# Patient Record
Sex: Male | Born: 1975 | Race: Black or African American | Hispanic: No | Marital: Single | Smoking: Former smoker
Health system: Southern US, Community
[De-identification: ages and names within clinical notes are randomized; demographics above are authoritative.]

## PROBLEM LIST (undated history)

## (undated) DIAGNOSIS — I10 Essential (primary) hypertension: Secondary | ICD-10-CM

## (undated) DIAGNOSIS — J45909 Unspecified asthma, uncomplicated: Secondary | ICD-10-CM

## (undated) HISTORY — PX: ANKLE FRACTURE SURGERY: SHX122

## (undated) HISTORY — PX: TONSILLECTOMY: SUR1361

## (undated) HISTORY — PX: ACHILLES TENDON SURGERY: SHX542

---

## 1999-10-21 ENCOUNTER — Encounter: Payer: Self-pay | Admitting: Internal Medicine

## 1999-10-21 ENCOUNTER — Emergency Department (HOSPITAL_COMMUNITY): Admission: EM | Admit: 1999-10-21 | Discharge: 1999-10-21 | Payer: Self-pay | Admitting: Internal Medicine

## 2003-05-06 ENCOUNTER — Emergency Department (HOSPITAL_COMMUNITY): Admission: EM | Admit: 2003-05-06 | Discharge: 2003-05-06 | Payer: Self-pay | Admitting: Emergency Medicine

## 2004-01-02 ENCOUNTER — Emergency Department (HOSPITAL_COMMUNITY): Admission: EM | Admit: 2004-01-02 | Discharge: 2004-01-03 | Payer: Self-pay | Admitting: Emergency Medicine

## 2005-07-05 ENCOUNTER — Emergency Department (HOSPITAL_COMMUNITY): Admission: EM | Admit: 2005-07-05 | Discharge: 2005-07-05 | Payer: Self-pay | Admitting: Emergency Medicine

## 2007-05-05 ENCOUNTER — Ambulatory Visit (HOSPITAL_BASED_OUTPATIENT_CLINIC_OR_DEPARTMENT_OTHER): Admission: RE | Admit: 2007-05-05 | Discharge: 2007-05-05 | Payer: Self-pay | Admitting: Otolaryngology

## 2007-05-07 ENCOUNTER — Ambulatory Visit: Payer: Self-pay | Admitting: Internal Medicine

## 2008-05-09 ENCOUNTER — Emergency Department (HOSPITAL_COMMUNITY): Admission: EM | Admit: 2008-05-09 | Discharge: 2008-05-10 | Payer: Self-pay | Admitting: Emergency Medicine

## 2010-07-02 ENCOUNTER — Ambulatory Visit (HOSPITAL_COMMUNITY): Admission: RE | Admit: 2010-07-02 | Discharge: 2010-07-03 | Payer: Self-pay | Admitting: Otolaryngology

## 2010-07-02 ENCOUNTER — Encounter (INDEPENDENT_AMBULATORY_CARE_PROVIDER_SITE_OTHER): Payer: Self-pay | Admitting: Otolaryngology

## 2011-03-07 LAB — URINALYSIS, ROUTINE W REFLEX MICROSCOPIC
Bilirubin Urine: NEGATIVE
Glucose, UA: NEGATIVE mg/dL
Hgb urine dipstick: NEGATIVE
Ketones, ur: NEGATIVE mg/dL
Nitrite: NEGATIVE
Protein, ur: NEGATIVE mg/dL
Specific Gravity, Urine: 1.018 (ref 1.005–1.030)
Urobilinogen, UA: 0.2 mg/dL (ref 0.0–1.0)
pH: 6 (ref 5.0–8.0)

## 2011-03-07 LAB — CBC
HCT: 43.5 % (ref 39.0–52.0)
Hemoglobin: 14.5 g/dL (ref 13.0–17.0)
MCH: 29 pg (ref 26.0–34.0)
MCHC: 33.3 g/dL (ref 30.0–36.0)
MCV: 86.9 fL (ref 78.0–100.0)
Platelets: 259 10*3/uL (ref 150–400)
RBC: 5.01 MIL/uL (ref 4.22–5.81)
RDW: 14.9 % (ref 11.5–15.5)
WBC: 7.2 10*3/uL (ref 4.0–10.5)

## 2011-03-07 LAB — SURGICAL PCR SCREEN
MRSA, PCR: NEGATIVE
Staphylococcus aureus: NEGATIVE

## 2011-05-04 NOTE — Procedures (Signed)
Gerald Palmer, Gerald Palmer              ACCOUNT NO.:  [id number]   MEDICAL RECORD NO.:  [id number]         PATIENT TYPE:  OUT   LOCATION:  SLEEP CENTER                 FACILITY:  [facility]   PHYSICIAN:  Clinton D. Maple Hudson, MD, FCCP, FACPDATE OF BIRTH:  09-12-1978   DATE OF STUDY:  05/05/2007                            NOCTURNAL POLYSOMNOGRAM   REFERRING PHYSICIAN:  Hermelinda Medicus, M.D.   INDICATION FOR STUDY:  Hypersomnia with sleep apnea.   EPWORTH SLEEPINESS SCORE:  6/24.  BMI 34.8.  Weight 225 pounds.   MEDICATIONS:  Albuterol, taken before arrival, and also at 9:10 p.m. and  3:55 a.m.; as well as Unisom taken at 10:40 p.m.   SLEEP ARCHITECTURE:  Total sleep time 310 minutes, with sleep efficiency  84%.  Stage 1 was 7%.  Stage 2 67%.  Stages 3 and 4 absent.  REM 26% of  total sleep time.  Sleep latency 21 minutes.  REM latency 71 minutes.  Awake after sleep onset 38 minutes.  Arousal index 5.4.  Unisom and  albuterol taken at bedtime.   RESPIRATORY DATA:  Diagnostic NPSG protocol.  Apnea/hypopnea index (AHI,  RDI) 5.8 obstructive events per hour, indicating mild obstructive sleep  apnea/hypopnea syndrome.  There was 7 obstructive apneas and 23  hypopneas.  Events were seen in all sleep positions, but somewhat more  common while supine.  REM AHI 11.3.   OXYGEN DATA:  Moderate to loud snoring, with oxygen desaturation to a  NADIR of 82%.  Mean oxygen saturation through the study was 94% on room  air.   CARDIAC DATA:  Sinus rhythm.   MOVEMENT-PARASOMNIA:  Occasional limb jerk with little effect on sleep.   IMPRESSIONS-RECOMMENDATIONS:  1. Mild obstructive sleep apnea/hypopnea syndrome, AHI 5.8 per hour;      with events somewhat more common while supine.  Moderate to loud      snoring, with oxygen desaturation to a NADIR of 82%.  2. Note need for albuterol twice for the study, suggesting asthma.     Clinton D. Maple Hudson, MD, [facility], FACP  Diplomate, Biomedical engineer of Sleep Medicine  Electronically Signed    CDY/MEDQ  D:  05/07/2007 12:42:33  T:  05/08/2007 01:30:07  Job:  [id number]

## 2012-11-07 ENCOUNTER — Ambulatory Visit (HOSPITAL_BASED_OUTPATIENT_CLINIC_OR_DEPARTMENT_OTHER): Payer: Federal, State, Local not specified - PPO | Attending: Otolaryngology | Admitting: Radiology

## 2012-11-07 VITALS — Ht 67.0 in | Wt 240.0 lb

## 2012-11-07 DIAGNOSIS — G4733 Obstructive sleep apnea (adult) (pediatric): Secondary | ICD-10-CM | POA: Insufficient documentation

## 2012-11-11 DIAGNOSIS — G4761 Periodic limb movement disorder: Secondary | ICD-10-CM

## 2012-11-11 DIAGNOSIS — G4733 Obstructive sleep apnea (adult) (pediatric): Secondary | ICD-10-CM

## 2012-11-11 DIAGNOSIS — R0609 Other forms of dyspnea: Secondary | ICD-10-CM

## 2012-11-11 DIAGNOSIS — R0989 Other specified symptoms and signs involving the circulatory and respiratory systems: Secondary | ICD-10-CM

## 2012-11-11 NOTE — Procedures (Signed)
Gerald Palmer, Gerald Palmer              ACCOUNT NO.:  [id number]  MEDICAL RECORD NO.:  [id number]          PATIENT TYPE:  OUT  LOCATION:  SLEEP CENTER                 FACILITY:  [facility]  PHYSICIAN:  Clinton D. Maple Hudson, MD, FCCP, FACPDATE OF BIRTH:  [birth year]Mar-1978  DATE OF STUDY:  11/07/2012                           NOCTURNAL POLYSOMNOGRAM  REFERRING PHYSICIAN:  Hermelinda Medicus, M.D.  INDICATION FOR STUDY:  Hypersomnia with sleep apnea.  EPWORTH SLEEPINESS SCORE:  5/24.  BMI 37.6.  Weight 240 pounds, height 67 inches, neck 18.5 inches.  MEDICATIONS:  Home medications are charted and reviewed.  A baseline diagnostic NPSG on May 05, 2007, recorded an AHI of 5.8 per hour.  He weighed 225 pounds.  SLEEP ARCHITECTURE:  Total sleep time 339 minutes with sleep efficiency 93%.  Stage I was 1.6%, stage II 73.7%, stage III absent, REM 24.6% of total sleep time.  Sleep latency 13.5 minutes, REM latency 27.5 minutes, awake after sleep onset 11 minutes.  Arousal index 9.2.  BEDTIME MEDICATION:  None.  RESPIRATORY DATA:  Apnea-hypopnea index (AHI) 12.6 per hour.  A total of 71 events was scored including 3 obstructive apneas, 3 central apneas, 1 mixed apnea, 64 hypopneas.  REM AHI 29.5 per hour.  There were insufficient numbers of early events to permit application of split protocol CPAP titration on this study night.  OXYGEN DATA:  Moderately loud snoring with oxygen desaturation to a nadir of 88% and mean oxygen saturation through the study of 93.9% on room air.  CARDIAC DATA:  Normal sinus rhythm.  MOVEMENT-PARASOMNIA:  A total of 5 limb jerks were counted, of which 1 was associated with arousals or awakenings for periodic limb movement with arousal index of 0.2 per hour.  Bathroom x1.  IMPRESSIONS-RECOMMENDATIONS: 1. Mild obstructive sleep apnea/hypopnea syndrome, AHI 12.6 per hour.     Events were more common in supine and when in REM.  REM AHI 29.5     per hour.  Moderately loud snoring with  oxygen desaturation to a     nadir of 88% and mean oxygen saturation through the study of 93.9%     on room air. 2. There were insufficient numbers of early events to permit     application of split protocol CPAP titration on this study night.     The patient can return for dedicated CPAP titration study if     appropriate. 3. A baseline diagnostic NPSG on May 05, 2007, had recorded an AHI of     5.8 per hour.  Body weight for that study was 225 pounds.     Clinton D. Maple Hudson, MD, [facility], FACP Diplomate, American Board of Sleep Medicine    CDY/MEDQ  D:  11/11/2012 10:45:51  T:  11/11/2012 23:27:58  Job:  [id number]

## 2014-08-17 ENCOUNTER — Emergency Department (HOSPITAL_COMMUNITY)
Admission: EM | Admit: 2014-08-17 | Discharge: 2014-08-17 | Disposition: A | Discharge status: Home / Self Care | Payer: Federal, State, Local not specified - PPO | Attending: Emergency Medicine | Admitting: Emergency Medicine

## 2014-08-17 ENCOUNTER — Encounter (HOSPITAL_COMMUNITY): Payer: Self-pay | Admitting: Emergency Medicine

## 2014-08-17 ENCOUNTER — Emergency Department (HOSPITAL_COMMUNITY): Payer: Federal, State, Local not specified - PPO

## 2014-08-17 DIAGNOSIS — M25579 Pain in unspecified ankle and joints of unspecified foot: Secondary | ICD-10-CM | POA: Diagnosis present

## 2014-08-17 DIAGNOSIS — X500XXA Overexertion from strenuous movement or load, initial encounter: Secondary | ICD-10-CM | POA: Insufficient documentation

## 2014-08-17 DIAGNOSIS — S93409A Sprain of unspecified ligament of unspecified ankle, initial encounter: Secondary | ICD-10-CM | POA: Diagnosis not present

## 2014-08-17 DIAGNOSIS — I10 Essential (primary) hypertension: Secondary | ICD-10-CM | POA: Diagnosis not present

## 2014-08-17 DIAGNOSIS — Y939 Activity, unspecified: Secondary | ICD-10-CM | POA: Diagnosis not present

## 2014-08-17 DIAGNOSIS — Y929 Unspecified place or not applicable: Secondary | ICD-10-CM | POA: Diagnosis not present

## 2014-08-17 DIAGNOSIS — Z9889 Other specified postprocedural states: Secondary | ICD-10-CM | POA: Diagnosis not present

## 2014-08-17 DIAGNOSIS — J45909 Unspecified asthma, uncomplicated: Secondary | ICD-10-CM | POA: Diagnosis not present

## 2014-08-17 DIAGNOSIS — S93402A Sprain of unspecified ligament of left ankle, initial encounter: Secondary | ICD-10-CM

## 2014-08-17 HISTORY — DX: Essential (primary) hypertension: I10

## 2014-08-17 HISTORY — DX: Unspecified asthma, uncomplicated: J45.909

## 2014-08-17 MED ORDER — HYDROCODONE-ACETAMINOPHEN 7.5-500 MG PO TABS
1.0000 | ORAL_TABLET | ORAL | Status: DC | PRN
Start: 2014-08-17 — End: 2017-02-09

## 2014-08-17 NOTE — ED Provider Notes (Signed)
CSN: [id number]     Arrival date & time 08/17/14  1532 History  This chart was scribed for a non-physician practitioner, Melvenia Beam A. Junius Finner, working with Suzi Roots, MD by [name], ED Scribe. The patient was seen in WTR9/WTR9. The patient's care was started at 5:13 PM.    Chief Complaint  Patient presents with  . Ankle Pain      Patient is a 38 y.o. male presenting with ankle pain. The history is provided by the patient. No language interpreter was used.  Ankle Pain Associated symptoms: no fever   HPI Comments: Gerald Palmer is a 38 y.o. male who presents to the Emergency Department complaining of swelling and pain to left ankle onset 1.5 week ago. Pt's relative reports that the more weight he puts on it, the worse the swelling gets. Pt has been altering his walk to make ambulation less painful. Pt has history of achilles tendon surgery and ankle fracture surgery. He denies any known injury that could be responsible for current symptoms. Pt allergic to Ibuprofen.    Past Medical History  Diagnosis Date  . Asthma   . Hypertension    Past Surgical History  Procedure Laterality Date  . Achilles tendon surgery    . Tonsillectomy    . Ankle fracture surgery Right    No family history on file. History  Substance Use Topics  . Smoking status: Never Smoker   . Smokeless tobacco: Not on file  . Alcohol Use: Yes     Comment: occasionally    Review of Systems  Constitutional: Negative for fever and chills.  Gastrointestinal: Negative for nausea, vomiting and diarrhea.  Musculoskeletal:       Left ankle pain and swelling.   All other systems reviewed and are negative.     Allergies  Ibuprofen  Home Medications   Prior to Admission medications   Not on File   BP 139/98  Pulse 97  Temp(Src) 98.4 F (36.9 C) (Oral)  Resp 16  SpO2 97% Physical Exam  Nursing note and vitals reviewed. Constitutional: He is oriented to person, place, and time. He appears  well-developed and well-nourished. No distress.  HENT:  Head: Normocephalic and atraumatic.  Eyes: Conjunctivae and EOM are normal.  Neck: Neck supple. No tracheal deviation present.  Cardiovascular: Normal rate.   Pulmonary/Chest: Effort normal. No respiratory distress.  Musculoskeletal: Normal range of motion.  Left ankle is swollen without bony deformity or discoloration. Joint is stable. Achilles palpated to be intact. FROM all digits. No calf tenderness.   Neurological: He is alert and oriented to person, place, and time.  Skin: Skin is warm and dry.  Psychiatric: He has a normal mood and affect. His behavior is normal.    ED Course  Procedures (including critical care time) Labs Review Labs Reviewed - No data to display  Results for orders placed during the hospital encounter of 07/02/10  SURGICAL PCR SCREEN      Result Value Ref Range   MRSA, PCR NEGATIVE  NEGATIVE   Staphylococcus aureus    NEGATIVE   Value: NEGATIVE            The Xpert SA Assay (FDA     approved for NASAL specimens     only), is one component of     a comprehensive surveillance     program.  It is not intended     to diagnose infection nor to     guide or monitor treatment.  CBC      Result Value Ref Range   WBC 7.2  4.0 - 10.5 K/uL   RBC 5.01  4.22 - 5.81 MIL/uL   Hemoglobin 14.5  13.0 - 17.0 g/dL   HCT 21.3  08.6 - 57.8 %   MCV 86.9  78.0 - 100.0 fL   MCH 29.0  26.0 - 34.0 pg   MCHC 33.3  30.0 - 36.0 g/dL   RDW 46.9  62.9 - 52.8 %   Platelets 259  150 - 400 K/uL  URINALYSIS, ROUTINE W REFLEX MICROSCOPIC      Result Value Ref Range   Color, Urine YELLOW  YELLOW   APPearance CLEAR  CLEAR   Specific Gravity, Urine 1.018  1.005 - 1.030   pH 6.0  5.0 - 8.0   Glucose, UA NEGATIVE  NEGATIVE mg/dL   Hgb urine dipstick NEGATIVE  NEGATIVE   Bilirubin Urine NEGATIVE  NEGATIVE   Ketones, ur NEGATIVE  NEGATIVE mg/dL   Protein, ur NEGATIVE  NEGATIVE mg/dL   Urobilinogen, UA 0.2  0.0 - 1.0 mg/dL    Nitrite NEGATIVE  NEGATIVE   Leukocytes, UA    NEGATIVE   Value: NEGATIVE MICROSCOPIC NOT DONE ON URINES WITH NEGATIVE PROTEIN, BLOOD, LEUKOCYTES, NITRITE, OR GLUCOSE <1000 mg/dL.    Imaging Review Dg Ankle Complete Left  08/17/2014   CLINICAL DATA:  Swelling, pain at left ankle.  EXAM: LEFT ANKLE COMPLETE - 3+ VIEW  COMPARISON:  None.  FINDINGS: Lateral soft tissue swelling. No acute bony abnormality. Specifically, no fracture, subluxation, or dislocation. Soft tissues are intact.  IMPRESSION: No acute bony abnormality.   Electronically Signed   By: Charlett Nose M.D.   On: 08/17/2014 16:38     EKG Interpretation None     Medications - No data to display  5:18 PM- Treatment plan was discussed with patient who verbalizes understanding and agrees.   MDM   Final diagnoses:  None    1. Left ankle aprain  Recommended follow up with his orthopedist if pain continues. ASO and crutches provided. Do not suspect Achilles re-injury.  I personally performed the services described in this documentation, which was scribed in my presence. The recorded information has been reviewed and is accurate.    Arnoldo Hooker, PA-C 08/17/14 1909

## 2014-08-17 NOTE — ED Notes (Signed)
Pt reports swelling and pain to L ankle x1.5 weeks. Hx of achilles tendon surgery. Denies known injury.

## 2014-08-17 NOTE — Discharge Instructions (Signed)
Ankle Sprain °An ankle sprain is an injury to the strong, fibrous tissues (ligaments) that hold the bones of your ankle joint together.  °CAUSES °An ankle sprain is usually caused by a fall or by twisting your ankle. Ankle sprains most commonly occur when you step on the outer edge of your foot, and your ankle turns inward. People who participate in sports are more prone to these types of injuries.  °SYMPTOMS  °· Pain in your ankle. The pain may be present at rest or only when you are trying to stand or walk. °· Swelling. °· Bruising. Bruising may develop immediately or within 1 to 2 days after your injury. °· Difficulty standing or walking, particularly when turning corners or changing directions. °DIAGNOSIS  °Your caregiver will ask you details about your injury and perform a physical exam of your ankle to determine if you have an ankle sprain. During the physical exam, your caregiver will press on and apply pressure to specific areas of your foot and ankle. Your caregiver will try to move your ankle in certain ways. An X-ray exam may be done to be sure a bone was not broken or a ligament did not separate from one of the bones in your ankle (avulsion fracture).  °TREATMENT  °Certain types of braces can help stabilize your ankle. Your caregiver can make a recommendation for this. Your caregiver may recommend the use of medicine for pain. If your sprain is severe, your caregiver may refer you to a surgeon who helps to restore function to parts of your skeletal system (orthopedist) or a physical therapist. °HOME CARE INSTRUCTIONS  °· Apply ice to your injury for 1-2 days or as directed by your caregiver. Applying ice helps to reduce inflammation and pain. °· Put ice in a plastic bag. °· Place a towel between your skin and the bag. °· Leave the ice on for 15-20 minutes at a time, every 2 hours while you are awake. °· Only take over-the-counter or prescription medicines for pain, discomfort, or fever as directed by  your caregiver. °· Elevate your injured ankle above the level of your heart as much as possible for 2-3 days. °· If your caregiver recommends crutches, use them as instructed. Gradually put weight on the affected ankle. Continue to use crutches or a cane until you can walk without feeling pain in your ankle. °· If you have a plaster splint, wear the splint as directed by your caregiver. Do not rest it on anything harder than a pillow for the first 24 hours. Do not put weight on it. Do not get it wet. You may take it off to take a shower or bath. °· You may have been given an elastic bandage to wear around your ankle to provide support. If the elastic bandage is too tight (you have numbness or tingling in your foot or your foot becomes cold and blue), adjust the bandage to make it comfortable. °· If you have an air splint, you may blow more air into it or let air out to make it more comfortable. You may take your splint off at night and before taking a shower or bath. Wiggle your toes in the splint several times per day to decrease swelling. °SEEK MEDICAL CARE IF:  °· You have rapidly increasing bruising or swelling. °· Your toes feel extremely cold or you lose feeling in your foot. °· Your pain is not relieved with medicine. °SEEK IMMEDIATE MEDICAL CARE IF: °· Your toes are numb or blue. °·   You have severe pain that is increasing. °MAKE SURE YOU:  °· Understand these instructions. °· Will watch your condition. °· Will get help right away if you are not doing well or get worse. °Document Released: 12/06/2005 Document Revised: 08/30/2012 Document Reviewed: 12/18/2011 °ExitCare® Patient Information ©2015 [employer]. This information is not intended to replace advice given to you by your health care provider. Make sure you discuss any questions you have with your health care provider. °Cryotherapy °Cryotherapy means treatment with cold. Ice or gel packs can be used to reduce both pain and swelling. Ice is the most  helpful within the first 24 to 48 hours after an injury or flare-up from overusing a muscle or joint. Sprains, strains, spasms, burning pain, shooting pain, and aches can all be eased with ice. Ice can also be used when recovering from surgery. Ice is effective, has very few side effects, and is safe for most people to use. °PRECAUTIONS  °Ice is not a safe treatment option for people with: °· Raynaud phenomenon. This is a condition affecting small blood vessels in the extremities. Exposure to cold may cause your problems to return. °· Cold hypersensitivity. There are many forms of cold hypersensitivity, including: °¨ Cold urticaria. Red, itchy hives appear on the skin when the tissues begin to warm after being iced. °¨ Cold erythema. This is a red, itchy rash caused by exposure to cold. °¨ Cold hemoglobinuria. Red blood cells break down when the tissues begin to warm after being iced. The hemoglobin that carry oxygen are passed into the urine because they cannot combine with blood proteins fast enough. °· Numbness or altered sensitivity in the area being iced. °If you have any of the following conditions, do not use ice until you have discussed cryotherapy with your caregiver: °· Heart conditions, such as arrhythmia, angina, or chronic heart disease. °· High blood pressure. °· Healing wounds or open skin in the area being iced. °· Current infections. °· Rheumatoid arthritis. °· Poor circulation. °· Diabetes. °Ice slows the blood flow in the region it is applied. This is beneficial when trying to stop inflamed tissues from spreading irritating chemicals to surrounding tissues. However, if you expose your skin to cold temperatures for too long or without the proper protection, you can damage your skin or nerves. Watch for signs of skin damage due to cold. °HOME CARE INSTRUCTIONS °Follow these tips to use ice and cold packs safely. °· Place a dry or damp towel between the ice and skin. A damp towel will cool the skin  more quickly, so you may need to shorten the time that the ice is used. °· For a more rapid response, add gentle compression to the ice. °· Ice for no more than 10 to 20 minutes at a time. The bonier the area you are icing, the less time it will take to get the benefits of ice. °· Check your skin after 5 minutes to make sure there are no signs of a poor response to cold or skin damage. °· Rest 20 minutes or more between uses. °· Once your skin is numb, you can end your treatment. You can test numbness by very lightly touching your skin. The touch should be so light that you do not see the skin dimple from the pressure of your fingertip. When using ice, most people will feel these normal sensations in this order: cold, burning, aching, and numbness. °· Do not use ice on someone who cannot communicate their responses to pain,   such as small children or people with dementia. °HOW TO MAKE AN ICE PACK °Ice packs are the most common way to use ice therapy. Other methods include ice massage, ice baths, and cryosprays. Muscle creams that cause a cold, tingly feeling do not offer the same benefits that ice offers and should not be used as a substitute unless recommended by your caregiver. °To make an ice pack, do one of the following: °· Place crushed ice or a bag of frozen vegetables in a sealable plastic bag. Squeeze out the excess air. Place this bag inside another plastic bag. Slide the bag into a pillowcase or place a damp towel between your skin and the bag. °· Mix 3 parts water with 1 part rubbing alcohol. Freeze the mixture in a sealable plastic bag. When you remove the mixture from the freezer, it will be slushy. Squeeze out the excess air. Place this bag inside another plastic bag. Slide the bag into a pillowcase or place a damp towel between your skin and the bag. °SEEK MEDICAL CARE IF: °· You develop white spots on your skin. This may give the skin a blotchy (mottled) appearance. °· Your skin turns blue or  pale. °· Your skin becomes waxy or hard. °· Your swelling gets worse. °MAKE SURE YOU:  °· Understand these instructions. °· Will watch your condition. °· Will get help right away if you are not doing well or get worse. °Document Released: 08/02/2011 Document Revised: 04/22/2014 Document Reviewed: 08/02/2011 °ExitCare® Patient Information ©2015 [employer]. This information is not intended to replace advice given to you by your health care provider. Make sure you discuss any questions you have with your health care provider. ° °

## 2014-08-19 NOTE — ED Provider Notes (Signed)
Medical screening examination/treatment/procedure(s) were performed by non-physician practitioner and as supervising physician I was immediately available for consultation/collaboration.     Suzi Roots, MD 08/19/14 [phone]

## 2014-11-10 ENCOUNTER — Encounter (HOSPITAL_COMMUNITY): Payer: Self-pay | Admitting: Emergency Medicine

## 2014-11-10 ENCOUNTER — Emergency Department (HOSPITAL_COMMUNITY)
Admission: EM | Admit: 2014-11-10 | Discharge: 2014-11-11 | Disposition: A | Discharge status: Home / Self Care | Payer: Federal, State, Local not specified - PPO | Attending: Emergency Medicine | Admitting: Emergency Medicine

## 2014-11-10 DIAGNOSIS — J45909 Unspecified asthma, uncomplicated: Secondary | ICD-10-CM | POA: Diagnosis not present

## 2014-11-10 DIAGNOSIS — R1031 Right lower quadrant pain: Secondary | ICD-10-CM

## 2014-11-10 DIAGNOSIS — R112 Nausea with vomiting, unspecified: Secondary | ICD-10-CM | POA: Insufficient documentation

## 2014-11-10 DIAGNOSIS — N23 Unspecified renal colic: Secondary | ICD-10-CM | POA: Insufficient documentation

## 2014-11-10 DIAGNOSIS — Z79899 Other long term (current) drug therapy: Secondary | ICD-10-CM | POA: Diagnosis not present

## 2014-11-10 DIAGNOSIS — I10 Essential (primary) hypertension: Secondary | ICD-10-CM | POA: Insufficient documentation

## 2014-11-10 LAB — CBC WITH DIFFERENTIAL/PLATELET
Basophils Absolute: 0 10*3/uL (ref 0.0–0.1)
Basophils Relative: 0 % (ref 0–1)
Eosinophils Absolute: 0.1 10*3/uL (ref 0.0–0.7)
Eosinophils Relative: 1 % (ref 0–5)
HEMATOCRIT: 44.9 % (ref 39.0–52.0)
Hemoglobin: 15 g/dL (ref 13.0–17.0)
LYMPHS ABS: 4 10*3/uL (ref 0.7–4.0)
LYMPHS PCT: 41 % (ref 12–46)
MCH: 27.9 pg (ref 26.0–34.0)
MCHC: 33.4 g/dL (ref 30.0–36.0)
MCV: 83.6 fL (ref 78.0–100.0)
MONO ABS: 0.5 10*3/uL (ref 0.1–1.0)
MONOS PCT: 5 % (ref 3–12)
NEUTROS ABS: 5.1 10*3/uL (ref 1.7–7.7)
Neutrophils Relative %: 53 % (ref 43–77)
Platelets: 316 10*3/uL (ref 150–400)
RBC: 5.37 MIL/uL (ref 4.22–5.81)
RDW: 14.3 % (ref 11.5–15.5)
WBC: 9.9 10*3/uL (ref 4.0–10.5)

## 2014-11-10 LAB — I-STAT TROPONIN, ED: Troponin i, poc: 0 ng/mL (ref 0.00–0.08)

## 2014-11-10 MED ORDER — MORPHINE SULFATE 4 MG/ML IJ SOLN
4.0000 mg | Freq: Once | INTRAMUSCULAR | Status: AC
  Administered 2014-11-10: 4 mg via INTRAVENOUS
  Filled 2014-11-10: qty 1

## 2014-11-10 MED ORDER — ONDANSETRON HCL 4 MG/2ML IJ SOLN
4.0000 mg | Freq: Once | INTRAMUSCULAR | Status: AC
  Administered 2014-11-10: 4 mg via INTRAVENOUS
  Filled 2014-11-10: qty 2

## 2014-11-10 MED ORDER — SODIUM CHLORIDE 0.9 % IV BOLUS (SEPSIS)
1000.0000 mL | Freq: Once | INTRAVENOUS | Status: AC
  Administered 2014-11-10: 1000 mL via INTRAVENOUS

## 2014-11-10 NOTE — ED Provider Notes (Signed)
CSN: [id number]     Arrival date & time 11/10/14  2236 History   First MD Initiated Contact with Patient 11/10/14 2307     Chief Complaint  Patient presents with  . Abdominal Pain     (Consider location/radiation/quality/duration/timing/severity/associated sxs/prior Treatment) HPI Patient presents with gradual onset right lower quadrant pain 2 hours prior to presentation. This associated with nausea and vomiting 3. He's had no fever or chills. Patient denies any urinary symptoms including hematuria, dysuria, frequency or urgency. Pain does not radiate. States is worse with movement. Admits to anorexia since onset of symptoms. Has had no similar pain and no abdominal surgeries. Past Medical History  Diagnosis Date  . Asthma   . Hypertension    Past Surgical History  Procedure Laterality Date  . Achilles tendon surgery    . Tonsillectomy    . Ankle fracture surgery Right    No family history on file. History  Substance Use Topics  . Smoking status: Never Smoker   . Smokeless tobacco: Not on file  . Alcohol Use: Yes     Comment: occasionally    Review of Systems  Constitutional: Negative for fever and chills.  Gastrointestinal: Positive for nausea, vomiting and abdominal pain. Negative for diarrhea, constipation and blood in stool.  Genitourinary: Negative for dysuria, frequency, hematuria and flank pain.  Musculoskeletal: Negative for myalgias, back pain, neck pain and neck stiffness.  Skin: Negative for rash and wound.  Neurological: Negative for dizziness, weakness, light-headedness, numbness and headaches.  All other systems reviewed and are negative.     Allergies  Ibuprofen and Shellfish allergy  Home Medications   Prior to Admission medications   Medication Sig Start Date End Date Taking? Authorizing Provider  albuterol (PROVENTIL HFA;VENTOLIN HFA) 108 (90 BASE) MCG/ACT inhaler Inhale 1 puff into the lungs every 6 (six) hours as needed for wheezing or  shortness of breath.   Yes Historical Provider, MD  albuterol (PROVENTIL) (2.5 MG/3ML) 0.083% nebulizer solution Take 2.5 mg by nebulization every 6 (six) hours as needed for wheezing or shortness of breath.   Yes Historical Provider, MD  losartan (COZAAR) 100 MG tablet Take 100 mg by mouth daily.   Yes Historical Provider, MD  HYDROcodone-acetaminophen (LORTAB 7.5) 7.5-500 MG per tablet Take 1 tablet by mouth every 4 (four) hours as needed for pain. 08/17/14   Melvenia BeamShari A Upstill, PA-C  ondansetron (ZOFRAN ODT) 4 MG disintegrating tablet 4mg  ODT q4 hours prn nausea/vomit 11/11/14   Loren Raceravid Sharetta Ricchio, MD  oxyCODONE-acetaminophen (PERCOCET) 5-325 MG per tablet Take 1-2 tablets by mouth every 4 (four) hours as needed for severe pain. 11/11/14   Loren Raceravid Lexus Barletta, MD  tamsulosin (FLOMAX) 0.4 MG CAPS capsule Take 1 capsule (0.4 mg total) by mouth daily. 11/11/14   Loren Raceravid Valine Drozdowski, MD   BP 130/79 mmHg  Pulse 72  Temp(Src) 98.3 F (36.8 C) (Oral)  Resp 16  Ht 5\' 7"  (1.702 m)  Wt 254 lb (115.214 kg)  BMI 39.77 kg/m2  SpO2 95% Physical Exam  Constitutional: He is oriented to person, place, and time. He appears well-developed and well-nourished. No distress.  HENT:  Head: Normocephalic and atraumatic.  Mouth/Throat: Oropharynx is clear and moist.  Eyes: EOM are normal. Pupils are equal, round, and reactive to light.  Neck: Normal range of motion. Neck supple.  Cardiovascular: Normal rate and regular rhythm.   Pulmonary/Chest: Effort normal and breath sounds normal. No respiratory distress. He has no wheezes. He has no rales.  Abdominal: Soft. Bowel sounds  are normal. He exhibits no distension and no mass. There is tenderness. There is no rebound and no guarding.  Musculoskeletal: Normal range of motion. He exhibits no edema or tenderness.  No CVA tenderness bilaterally.  Neurological: He is alert and oriented to person, place, and time.  Skin: Skin is warm and dry. No rash noted. No erythema.   Psychiatric: He has a normal mood and affect. His behavior is normal.  Nursing note and vitals reviewed.   ED Course  Procedures (including critical care time) Labs Review Labs Reviewed  COMPREHENSIVE METABOLIC PANEL - Abnormal; Notable for the following:    Glucose, Bld 152 (*)    Creatinine, Ser 1.57 (*)    Total Bilirubin 0.2 (*)    GFR calc non Af Amer 55 (*)    GFR calc Af Amer 64 (*)    All other components within normal limits  URINALYSIS, ROUTINE W REFLEX MICROSCOPIC - Abnormal; Notable for the following:    APPearance CLOUDY (*)    Hgb urine dipstick LARGE (*)    All other components within normal limits  CBC WITH DIFFERENTIAL  LIPASE, BLOOD  URINE MICROSCOPIC-ADD ON  I-STAT TROPOININ, ED    Imaging Review Ct Abdomen Pelvis W Contrast  11/11/2014   CLINICAL DATA:  Right lower quadrant pain  EXAM: CT ABDOMEN AND PELVIS WITH CONTRAST  TECHNIQUE: Multidetector CT imaging of the abdomen and pelvis was performed using the standard protocol following bolus administration of intravenous contrast.  CONTRAST:  OMNIPAQUE IOHEXOL 300 MG/ML  SOLN  COMPARISON:  None  FINDINGS: Lower chest: Lung bases are clear. No pleural or pericardial effusion.  Hepatobiliary: There is no focal liver abnormality. The gallbladder appears normal. No biliary dilatation.  Pancreas: Normal appearance of the pancreas.  Spleen: The spleen appears normal.  Adrenals/Urinary Tract: The adrenal glands are both normal. Bilateral renal cysts are noted. There is right pelvocaliectasis and proximal hydroureter. At the right UVJ there is a stone which measures 5 mm, image 76/series 2.  Stomach/Bowel: The stomach is within normal limits. The small bowel loops have a normal course and caliber. No obstruction. Normal appearance of the colon. The appendix is visualized and appears normal.  Vascular/Lymphatic: Normal appearance of the abdominal aorta. No enlarged retroperitoneal or mesenteric adenopathy. No enlarged  pelvic or inguinal lymph nodes.  Reproductive: The prostate gland and seminal vesicles appear normal.  Other: There is no ascites or focal fluid collections within the abdomen or pelvis. There is a small umbilical hernia which contains fat only.  Musculoskeletal: Visualized osseous structures are on unremarkable.  IMPRESSION: 1. Right UVJ calculus measures 5 mm. This causes mild right hydroureter and pelvocaliectasis. 2. Small fat containing umbilical hernia.   Electronically Signed   By: Signa Kell M.D.   On: 11/11/2014 01:36     EKG Interpretation None      Date: 11/10/2014  Rate: 65  Rhythm: normal sinus rhythm  QRS Axis: normal  Intervals: normal  ST/T Wave abnormalities: nonspecific T wave changes  Conduction Disutrbances:none  Narrative Interpretation:   Old EKG Reviewed: none available   MDM   Final diagnoses:  RLQ abdominal pain  Renal colic on right side    Concern for acute appendicitis. We'll keep patient NPO, treat with pain medications and obtain CT of the abdomen.  CT with stone in the UVJ. Patient's pain has resolved. Abdomen is benign. Advised follow-up with urology in 2-3 days for persistent symptoms. Return precautions have been given.  Loren Racer,  MD 11/11/14 [id number]

## 2014-11-10 NOTE — ED Notes (Signed)
Pt presents with RLQ pain onset ago, per wife pt became diaphoretic, had episode of emesis. Pt denies testicular or flank pain. Denies hematuria or urinary difficulty.

## 2014-11-11 ENCOUNTER — Emergency Department (HOSPITAL_COMMUNITY): Payer: Federal, State, Local not specified - PPO

## 2014-11-11 ENCOUNTER — Encounter (HOSPITAL_COMMUNITY): Payer: Self-pay

## 2014-11-11 LAB — URINE MICROSCOPIC-ADD ON

## 2014-11-11 LAB — COMPREHENSIVE METABOLIC PANEL
ALT: 45 U/L (ref 0–53)
AST: 36 U/L (ref 0–37)
Albumin: 4.1 g/dL (ref 3.5–5.2)
Alkaline Phosphatase: 92 U/L (ref 39–117)
Anion gap: 14 (ref 5–15)
BILIRUBIN TOTAL: 0.2 mg/dL — AB (ref 0.3–1.2)
BUN: 13 mg/dL (ref 6–23)
CHLORIDE: 97 meq/L (ref 96–112)
CO2: 26 meq/L (ref 19–32)
CREATININE: 1.57 mg/dL — AB (ref 0.50–1.35)
Calcium: 9.2 mg/dL (ref 8.4–10.5)
GFR calc Af Amer: 64 mL/min — ABNORMAL LOW (ref >90)
GFR, EST NON AFRICAN AMERICAN: 55 mL/min — AB (ref >90)
Glucose, Bld: 152 mg/dL — ABNORMAL HIGH (ref 70–99)
Potassium: 4 mEq/L (ref 3.7–5.3)
Sodium: 137 mEq/L (ref 137–147)
Total Protein: 7.7 g/dL (ref 6.0–8.3)

## 2014-11-11 LAB — URINALYSIS, ROUTINE W REFLEX MICROSCOPIC
Bilirubin Urine: NEGATIVE
GLUCOSE, UA: NEGATIVE mg/dL
Ketones, ur: NEGATIVE mg/dL
Leukocytes, UA: NEGATIVE
Nitrite: NEGATIVE
Protein, ur: NEGATIVE mg/dL
Specific Gravity, Urine: 1.012 (ref 1.005–1.030)
Urobilinogen, UA: 0.2 mg/dL (ref 0.0–1.0)
pH: 5.5 (ref 5.0–8.0)

## 2014-11-11 LAB — LIPASE, BLOOD: Lipase: 21 U/L (ref 11–59)

## 2014-11-11 MED ORDER — MORPHINE SULFATE 4 MG/ML IJ SOLN
4.0000 mg | Freq: Once | INTRAMUSCULAR | Status: AC
  Administered 2014-11-11: 4 mg via INTRAVENOUS
  Filled 2014-11-11: qty 1

## 2014-11-11 MED ORDER — OXYCODONE-ACETAMINOPHEN 5-325 MG PO TABS: 1.0000 | ORAL_TABLET | ORAL | Status: DC | PRN

## 2014-11-11 MED ORDER — TAMSULOSIN HCL 0.4 MG PO CAPS: 0.4000 mg | ORAL_CAPSULE | Freq: Every day | ORAL | Status: DC

## 2014-11-11 MED ORDER — IOHEXOL 300 MG/ML  SOLN
100.0000 mL | Freq: Once | INTRAMUSCULAR | Status: AC | PRN
  Administered 2014-11-11: 100 mL via INTRAVENOUS

## 2014-11-11 MED ORDER — ONDANSETRON 4 MG PO TBDP: ORAL_TABLET | ORAL | Status: DC

## 2014-11-11 MED ORDER — IOHEXOL 300 MG/ML  SOLN
50.0000 mL | Freq: Once | INTRAMUSCULAR | Status: AC | PRN
  Administered 2014-11-11: 50 mL via ORAL

## 2014-11-11 NOTE — Discharge Instructions (Signed)

## 2015-04-19 ENCOUNTER — Encounter (HOSPITAL_COMMUNITY): Payer: Self-pay | Admitting: Emergency Medicine

## 2015-04-19 ENCOUNTER — Emergency Department (HOSPITAL_COMMUNITY)
Admission: EM | Admit: 2015-04-19 | Discharge: 2015-04-19 | Disposition: A | Discharge status: Home / Self Care | Payer: Federal, State, Local not specified - PPO | Attending: Emergency Medicine | Admitting: Emergency Medicine

## 2015-04-19 DIAGNOSIS — I10 Essential (primary) hypertension: Secondary | ICD-10-CM | POA: Insufficient documentation

## 2015-04-19 DIAGNOSIS — X58XXXA Exposure to other specified factors, initial encounter: Secondary | ICD-10-CM | POA: Insufficient documentation

## 2015-04-19 DIAGNOSIS — J45909 Unspecified asthma, uncomplicated: Secondary | ICD-10-CM | POA: Insufficient documentation

## 2015-04-19 DIAGNOSIS — Y9289 Other specified places as the place of occurrence of the external cause: Secondary | ICD-10-CM | POA: Insufficient documentation

## 2015-04-19 DIAGNOSIS — Y9389 Activity, other specified: Secondary | ICD-10-CM | POA: Insufficient documentation

## 2015-04-19 DIAGNOSIS — S46912A Strain of unspecified muscle, fascia and tendon at shoulder and upper arm level, left arm, initial encounter: Secondary | ICD-10-CM

## 2015-04-19 DIAGNOSIS — Y990 Civilian activity done for income or pay: Secondary | ICD-10-CM | POA: Insufficient documentation

## 2015-04-19 DIAGNOSIS — Z79899 Other long term (current) drug therapy: Secondary | ICD-10-CM | POA: Insufficient documentation

## 2015-04-19 MED ORDER — METHOCARBAMOL 500 MG PO TABS: 500.0000 mg | ORAL_TABLET | Freq: Two times a day (BID) | ORAL | Status: DC

## 2015-04-19 NOTE — ED Notes (Signed)
Pt c/o L shoulder pain since last Thursday. 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 States his shoulder began to feel tight. ROM intact with shoulder tenderness. States he injured his shoulder about 20 yrs ago. Pt has taken Tylenol and some left over Oxycodone which improved his pain. Rates pain 6/10.

## 2015-04-19 NOTE — ED Provider Notes (Signed)
CSN: [id number]     Arrival date & time 04/19/15  1529 History  This chart was scribed for non-physician practitioner, Fayrene Helper, PA-C,working with Azalia Bilis, MD, by Karle Plumber, ED Scribe. This patient was seen in room WTR5/WTR5 and the patient's care was started at 3:53 PM.  Chief Complaint  Patient presents with  . Shoulder Pain   The history is provided by the patient and medical records. No language interpreter was used.   HPI Comments:  Gerald Palmer is a 39 y.o. male who presents to the Emergency Department complaining of severe left shoulder pain that began two days ago. He states he was working when it began feeling tight but was able to finish his shift. 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 He reports the pain is sharp and begins in the anterior shoulder and radiates up towards the top of the shoulder. Pt states he has been applying warm compresses, taking Goody Powder and an Oxycodone tablet with some relief of the pain. Driving and hitting bumps in the road and movement of the left arm makes the pain worse. Denies alleviating factors besides the medication. Denies numbness, tingling or weakness of the LUE, redness or warmth of the area, inability to grip or hold things, bruising or wounds. PMHx of asthma and HTN.   Past Medical History  Diagnosis Date  . Asthma   . Hypertension    Past Surgical History  Procedure Laterality Date  . Achilles tendon surgery    . Tonsillectomy    . Ankle fracture surgery Right    No family history on file. History  Substance Use Topics  . Smoking status: Never Smoker   . Smokeless tobacco: Not on file  . Alcohol Use: Yes     Comment: occasionally    Review of Systems  Musculoskeletal: Positive for myalgias.  Skin: Negative for color change and wound.  Neurological: Negative for weakness and numbness.    Allergies  Ibuprofen and Shellfish allergy  Home Medications   Prior to Admission medications   Medication  Sig Start Date End Date Taking? Authorizing Provider  albuterol (PROVENTIL HFA;VENTOLIN HFA) 108 (90 BASE) MCG/ACT inhaler Inhale 1 puff into the lungs every 6 (six) hours as needed for wheezing or shortness of breath.    Historical Provider, MD  albuterol (PROVENTIL) (2.5 MG/3ML) 0.083% nebulizer solution Take 2.5 mg by nebulization every 6 (six) hours as needed for wheezing or shortness of breath.    Historical Provider, MD  HYDROcodone-acetaminophen (LORTAB 7.5) 7.5-500 MG per tablet Take 1 tablet by mouth every 4 (four) hours as needed for pain. 08/17/14   Elpidio Anis, PA-C  losartan (COZAAR) 100 MG tablet Take 100 mg by mouth daily.    Historical Provider, MD  ondansetron (ZOFRAN ODT) 4 MG disintegrating tablet  ODT q4 hours prn nausea/vomit 11/11/14   Loren Racer, MD  oxyCODONE-acetaminophen (PERCOCET) 5-325 MG per tablet Take 1-2 tablets by mouth every 4 (four) hours as needed for severe pain. 11/11/14   Loren Racer, MD  tamsulosin (FLOMAX) 0.4 MG CAPS capsule Take 1 capsule (0.4 mg total) by mouth daily. 11/11/14   Loren Racer, MD   Triage Vitals: BP 137/95 mmHg  Pulse 72  Temp(Src) 97.9 F (36.6 C) (Oral)  Resp 16  SpO2 98% Physical Exam  Constitutional: He is oriented to person, place, and time. He appears well-developed and well-nourished.  HENT:  Head: Normocephalic and atraumatic.  Eyes: EOM are normal.  Neck: Normal range of motion.  Cardiovascular: Normal rate.   Pulmonary/Chest: Effort normal.  Musculoskeletal: Normal range of motion. He exhibits tenderness.  No significant midline spinal tenderness. Mild tenderness to palpation of left trapezius muscle. Tenderness noted to left shoulder at [facility] joint and humeral glenoid joint.  Neurological: He is alert and oriented to person, place, and time.  Strength of LUE 5/5.  Skin: Skin is warm and dry.  Psychiatric: He has a normal mood and affect. His behavior is normal.  Nursing note and vitals reviewed.   ED  Course  Procedures (including critical care time) DIAGNOSTIC STUDIES: Oxygen Saturation is 98% on RA, normal by my interpretation.   COORDINATION OF CARE: 3:59 PM- Reassured pt that injury is likely muscle strain. Encouraged pt to continue to take NSAIDs and alternate heat and ice therapy. Pt verbalizes understanding and agrees to plan.  Medications - No data to display  Labs Review Labs Reviewed - No data to display  Imaging Review No results found.   EKG Interpretation None      MDM   Final diagnoses:  Left shoulder strain, initial encounter    BP 137/95 mmHg  Pulse 72  Temp(Src) 97.9 F (36.6 C) (Oral)  Resp 16  SpO2 98%   I personally performed the services described in this documentation, which was scribed in my presence. The recorded information has been reviewed and is accurate.    Fayrene HelperBowie Mikle Sternberg, PA-C 04/19/15 1603  Azalia BilisKevin Campos, MD 04/19/15 [phone]

## 2015-04-19 NOTE — Discharge Instructions (Signed)
Strain A strain is an injury to a muscle or the tissue that connects muscles to bones (tendon). In a strain injury, the muscle or tendon is either stretched or torn. Muscles are more susceptible to strains if they cross two joints, such as:  Hamstrings.  Quadriceps.  Calves.  Biceps. There are three categories of strains:  A first-degree strain is a small tear in the muscle. There is no lengthening of the muscle, but pain may be present with contraction of the muscle.  A second-degree strain is a small tear in the muscle accompanied by lengthening of the muscle. Muscles with a second-degree strain are still able to function.  A third-degree strain is a complete tear of the muscle. Muscles with a third-degree strain cannot function properly. Strains often have bleeding and bruising within the muscle. SYMPTOMS   Pain, tenderness, redness or bruising, and swelling in the area of injury.  Loss of normal mobility of the injured joint. CAUSES  A sudden force exerted on a muscle or tendon that it cannot withstand usually causes strains. This may be due to a sudden overload of a contracted muscle, overuse, or sudden increase or change in activity.  RISK INCREASES WITH:  Trauma.  Poor strength and flexibility.  Failure to warm-up properly before activity.  Return to activity before healing is complete. PREVENTION  Warm-up and stretch properly before and activity.  Maintain physical fitness:  Joint flexibility.  Muscle strength.  Endurance and conditioning.  Strengthen weak muscles with exercises to prevent recurrence. PROGNOSIS  If treated properly, strains are usually curable. The time it takes to recover is related to the severity of the injury and usually varies from 2 to 8 weeks. RELATED COMPLICATIONS   Re-injury or recurrence of symptoms, permanent weakness.  Joint stiffness if the strain is severe and rehabilitation is incomplete.  Delayed healing or resolution of  symptoms if sports are resumed before rehabilitation is complete.  Excessive bleeding into muscle, especially if taking anti-inflammatory medicines. This can lead to delayed recovery and injury to nerves, muscle, and blood vessels; this is an emergency. TREATMENT  Treatment initially involves ice and medicine to help reduce pain and inflammation. Use of the affected muscle should be limited by a:  Brace.  Elastic bandage wrapping.  Splint.  Cast.  Sling. Strengthening and stretching exercises may be necessary after immobilization to prevent joint stiffness. These exercises may be completed at home or with a therapist. If the tendon is torn, then surgery may be necessary to repair it.  MEDICATION   Avoid aspirin or ibuprofen in the first 48 hours after the injury. These medicines may increase the tendency to bleed. During this time, you may take pain relievers, such as acetaminophen, that do not affect bleeding.  After the first 48 hours, if pain medicine is necessary, then nonsteroidal anti-inflammatory medicines, such as aspirin and ibuprofen, or other minor pain relievers, such as acetaminophen, are often recommended.  Do not take pain medicine within 7 days before surgery.  Prescription pain relievers may be prescribed. Use only as directed and only as much as you need  Ointments applied to the skin may be helpful. HEAT AND COLD  Cold treatment (icing) relieves pain and reduces inflammation. Cold treatment should be applied for 10 to 15 minutes every 2 to 3 hours for inflammation and pain and immediately after any activity that aggravates your symptoms. Use ice packs or massage the area with a piece of ice (ice massage).  Heat treatment may be  used prior to performing the stretching and strengthening activities prescribed by your caregiver, physical therapist, or athletic trainer. Use a heat pack or soak your injury in warm water. °SEEK MEDICAL CARE IF:  °· Symptoms get worse or do  not improve despite treatment. °· Pain becomes intolerable. °· You experience numbness or tingling. °· Toes or fingernails become cold or develop a blue, gray, or dusky color. °· New, unexplained symptoms develop (drugs used in treatment may produce side effects). °Document Released: 12/06/2005 Document Revised: 02/28/2012 Document Reviewed: 03/20/2009 °[employer]® Patient Information ©2015 [employer]. This information is not intended to replace advice given to you by your health care provider. Make sure you discuss any questions you have with your health care provider. ° °

## 2015-08-05 ENCOUNTER — Encounter (HOSPITAL_COMMUNITY): Payer: Self-pay | Admitting: Emergency Medicine

## 2015-08-05 ENCOUNTER — Emergency Department (HOSPITAL_COMMUNITY)
Admission: EM | Admit: 2015-08-05 | Discharge: 2015-08-05 | Disposition: A | Discharge status: Home / Self Care | Payer: Federal, State, Local not specified - PPO | Attending: Emergency Medicine | Admitting: Emergency Medicine

## 2015-08-05 DIAGNOSIS — M7662 Achilles tendinitis, left leg: Secondary | ICD-10-CM

## 2015-08-05 DIAGNOSIS — M79672 Pain in left foot: Secondary | ICD-10-CM | POA: Diagnosis present

## 2015-08-05 DIAGNOSIS — J45909 Unspecified asthma, uncomplicated: Secondary | ICD-10-CM | POA: Insufficient documentation

## 2015-08-05 DIAGNOSIS — I10 Essential (primary) hypertension: Secondary | ICD-10-CM | POA: Diagnosis not present

## 2015-08-05 DIAGNOSIS — Z79899 Other long term (current) drug therapy: Secondary | ICD-10-CM | POA: Insufficient documentation

## 2015-08-05 DIAGNOSIS — Z9889 Other specified postprocedural states: Secondary | ICD-10-CM | POA: Insufficient documentation

## 2015-08-05 MED ORDER — MELOXICAM 15 MG PO TABS: 15.0000 mg | ORAL_TABLET | Freq: Every day | ORAL | Status: DC

## 2015-08-05 NOTE — ED Notes (Signed)
Pt states that he went jogging on august 4th then on aug 6th he noticed swelling in left achiles area.  Pt states that he had surgery on it 5 years ago.

## 2015-08-05 NOTE — ED Notes (Signed)
Abdella called x 2.

## 2015-08-05 NOTE — Discharge Instructions (Signed)
Ice your Achilles tendon several times a day. Keep elevated. No strenuous activity. Ace wrap for compression and support. Follow with orthopedic specialist if not improving.  Achilles Tendinitis  with Rehab Achilles tendinitis is a disorder of the Achilles tendon. The Achilles tendon connects the large calf muscles (Gastrocnemius and Soleus) to the heel bone (calcaneus). This tendon is sometimes called the heel cord. It is important for pushing-off and standing on your toes and is important for walking, running, or jumping. Tendinitis is often caused by overuse and repetitive microtrauma. SYMPTOMS  Pain, tenderness, swelling, warmth, and redness may occur over the Achilles tendon even at rest.  Pain with pushing off, or flexing or extending the ankle.  Pain that is worsened after or during activity. CAUSES   Overuse sometimes seen with rapid increase in exercise programs or in sports requiring running and jumping.  Poor physical conditioning (strength and flexibility or endurance).  Running sports, especially training running down hills.  Inadequate warm-up before practice or play or failure to stretch before participation.  Injury to the tendon. PREVENTION   Warm up and stretch before practice or competition.  Allow time for adequate rest and recovery between practices and competition.  Keep up conditioning.  Keep up ankle and leg flexibility.  Improve or keep muscle strength and endurance.  Improve cardiovascular fitness.  Use proper technique.  Use proper equipment (shoes, skates).  To help prevent recurrence, taping, protective strapping, or an adhesive bandage may be recommended for several weeks after healing is complete. PROGNOSIS   Recovery may take weeks to several months to heal.  Longer recovery is expected if symptoms have been prolonged.  Recovery is usually quicker if the inflammation is due to a direct blow as compared with overuse or sudden  strain. RELATED COMPLICATIONS   Healing time will be prolonged if the condition is not correctly treated. The injury must be given plenty of time to heal.  Symptoms can reoccur if activity is resumed too soon.  Untreated, tendinitis may increase the risk of tendon rupture requiring additional time for recovery and possibly surgery. TREATMENT   The first treatment consists of rest anti-inflammatory medication, and ice to relieve the pain.  Stretching and strengthening exercises after resolution of pain will likely help reduce the risk of recurrence. Referral to a physical therapist or athletic trainer for further evaluation and treatment may be helpful.  A walking boot or cast may be recommended to rest the Achilles tendon. This can help break the cycle of inflammation and microtrauma.  Arch supports (orthotics) may be prescribed or recommended by your caregiver as an adjunct to therapy and rest.  Surgery to remove the inflamed tendon lining or degenerated tendon tissue is rarely necessary and has shown less than predictable results. MEDICATION   Nonsteroidal anti-inflammatory medications, such as aspirin and ibuprofen, may be used for pain and inflammation relief. Do not take within 7 days before surgery. Take these as directed by your caregiver. Contact your caregiver immediately if any bleeding, stomach upset, or signs of allergic reaction occur. Other minor pain relievers, such as acetaminophen, may also be used.  Pain relievers may be prescribed as necessary by your caregiver. Do not take prescription pain medication for longer than 4 to 7 days. Use only as directed and only as much as you need.  Cortisone injections are rarely indicated. Cortisone injections may weaken tendons and predispose to rupture. It is better to give the condition more time to heal than to use them. HEAT  AND COLD  Cold is used to relieve pain and reduce inflammation for acute and chronic Achilles tendinitis.  Cold should be applied for 10 to 15 minutes every 2 to 3 hours for inflammation and pain and immediately after any activity that aggravates your symptoms. Use ice packs or an ice massage.  Heat may be used before performing stretching and strengthening activities prescribed by your caregiver. Use a heat pack or a warm soak. SEEK MEDICAL CARE IF:  Symptoms get worse or do not improve in 2 weeks despite treatment.  New, unexplained symptoms develop. Drugs used in treatment may produce side effects. EXERCISES RANGE OF MOTION (ROM) AND STRETCHING EXERCISES - Achilles Tendinitis  These exercises may help you when beginning to rehabilitate your injury. Your symptoms may resolve with or without further involvement from your physician, physical therapist or athletic trainer. While completing these exercises, remember:   Restoring tissue flexibility helps normal motion to return to the joints. This allows healthier, less painful movement and activity.  An effective stretch should be held for at least 30 seconds.  A stretch should never be painful. You should only feel a gentle lengthening or release in the stretched tissue. STRETCH - Gastroc, Standing   Place hands on wall.  Extend right / left leg, keeping the front knee somewhat bent.  Slightly point your toes inward on your back foot.  Keeping your right / left heel on the floor and your knee straight, shift your weight toward the wall, not allowing your back to arch.  You should feel a gentle stretch in the right / left calf. Hold this position for __________ seconds. Repeat __________ times. Complete this stretch __________ times per day. STRETCH - Soleus, Standing   Place hands on wall.  Extend right / left leg, keeping the other knee somewhat bent.  Slightly point your toes inward on your back foot.  Keep your right / left heel on the floor, bend your back knee, and slightly shift your weight over the back leg so that you feel a  gentle stretch deep in your back calf.  Hold this position for __________ seconds. Repeat __________ times. Complete this stretch __________ times per day. STRETCH - Gastrocsoleus, Standing  Note: This exercise can place a lot of stress on your foot and ankle. Please complete this exercise only if specifically instructed by your caregiver.   Place the ball of your right / left foot on a step, keeping your other foot firmly on the same step.  Hold on to the wall or a rail for balance.  Slowly lift your other foot, allowing your body weight to press your heel down over the edge of the step.  You should feel a stretch in your right / left calf.  Hold this position for __________ seconds.  Repeat this exercise with a slight bend in your knee. Repeat __________ times. Complete this stretch __________ times per day.  STRENGTHENING EXERCISES - Achilles Tendinitis These exercises may help you when beginning to rehabilitate your injury. They may resolve your symptoms with or without further involvement from your physician, physical therapist or athletic trainer. While completing these exercises, remember:   Muscles can gain both the endurance and the strength needed for everyday activities through controlled exercises.  Complete these exercises as instructed by your physician, physical therapist or athletic trainer. Progress the resistance and repetitions only as guided.  You may experience muscle soreness or fatigue, but the pain or discomfort you are trying to eliminate should  never worsen during these exercises. If this pain does worsen, stop and make certain you are following the directions exactly. If the pain is still present after adjustments, discontinue the exercise until you can discuss the trouble with your clinician. STRENGTH - Plantar-flexors   Sit with your right / left leg extended. Holding onto both ends of a rubber exercise band/tubing, loop it around the ball of your foot. Keep  a slight tension in the band.  Slowly push your toes away from you, pointing them downward.  Hold this position for __________ seconds. Return slowly, controlling the tension in the band/tubing. Repeat __________ times. Complete this exercise __________ times per day.  STRENGTH - Plantar-flexors   Stand with your feet shoulder width apart. Steady yourself with a wall or table using as little support as needed.  Keeping your weight evenly spread over the width of your feet, rise up on your toes.*  Hold this position for __________ seconds. Repeat __________ times. Complete this exercise __________ times per day.  *If this is too easy, shift your weight toward your right / left leg until you feel challenged. Ultimately, you may be asked to do this exercise with your right / left foot only. STRENGTH - Plantar-flexors, Eccentric  Note: This exercise can place a lot of stress on your foot and ankle. Please complete this exercise only if specifically instructed by your caregiver.   Place the balls of your feet on a step. With your hands, use only enough support from a wall or rail to keep your balance.  Keep your knees straight and rise up on your toes.  Slowly shift your weight entirely to your right / left toes and pick up your opposite foot. Gently and with controlled movement, lower your weight through your right / left foot so that your heel drops below the level of the step. You will feel a slight stretch in the back of your calf at the end position.  Use the healthy leg to help rise up onto the balls of both feet, then lower weight only on the right / left leg again. Build up to 15 repetitions. Then progress to 3 consecutive sets of 15 repetitions.*  After completing the above exercise, complete the same exercise with a slight knee bend (about 30 degrees). Again, build up to 15 repetitions. Then progress to 3 consecutive sets of 15 repetitions.* Perform this exercise __________ times per  day.  *When you easily complete 3 sets of 15, your physician, physical therapist or athletic trainer may advise you to add resistance by wearing a backpack filled with additional weight. STRENGTH - Plantar Flexors, Seated   Sit on a chair that allows your feet to rest flat on the ground. If necessary, sit at the edge of the chair.  Keeping your toes firmly on the ground, lift your right / left heel as far as you can without increasing any discomfort in your ankle. Repeat __________ times. Complete this exercise __________ times a day. *If instructed by your physician, physical therapist or athletic trainer, you may add ____________________ of resistance by placing a weighted object on your right / left knee. Document Released: 07/07/2005 Document Revised: 02/28/2012 Document Reviewed: 03/20/2009 [facility] Patient Information 2015 [place], [place]. This information is not intended to replace advice given to you by your health care provider. Make sure you discuss any questions you have with your health care provider.

## 2015-08-05 NOTE — ED Provider Notes (Signed)
CSN: [id number]     Arrival date & time 08/05/15  1454 History  This chart was scribed for non-physician practitioner, Jaynie Crumble, PA-C, working with Lyndal Pulley, MD by Marica Otter, ED Scribe. This patient was seen in room WTR9/WTR9 and the patient's care was started at 3:39 PM.   Chief Complaint  Patient presents with  . Foot Pain    HPI PCP: Hermelinda Medicus, MD HPI Comments: Gerald Palmer is a 39 y.o. male, with PMHx noted below including achilles rupture and achilles tendon surgery, who presents to the Emergency Department complaining of atraumatic, sudden onset, recurring, intermittent, 4/10 left achilles pain with associated swelling onset 07/24/15 after pt went jogging which involved running uphill. He denies any other injuries. He has been taking ibuprofen since then and states his Achilles is not feeling any better. He denies any fever or chills. Denies any weakness or numbness to the foot. No other treatment.  Past Medical History  Diagnosis Date  . Asthma   . Hypertension    Past Surgical History  Procedure Laterality Date  . Achilles tendon surgery    . Tonsillectomy    . Ankle fracture surgery Right    No family history on file. Social History  Substance Use Topics  . Smoking status: Never Smoker   . Smokeless tobacco: None  . Alcohol Use: Yes     Comment: occasionally    Review of Systems  Constitutional: Negative for fever and chills.  Musculoskeletal: Positive for arthralgias ( left achilles pain).  Neurological: Negative for weakness and numbness.   Allergies  Ibuprofen and Shellfish allergy  Home Medications   Prior to Admission medications   Medication Sig Start Date End Date Taking? Authorizing Provider  albuterol (PROVENTIL HFA;VENTOLIN HFA) 108 (90 BASE) MCG/ACT inhaler Inhale 1 puff into the lungs every 6 (six) hours as needed for wheezing or shortness of breath.    Historical Provider, MD  albuterol (PROVENTIL) (2.5 MG/3ML) 0.083%  nebulizer solution Take 2.5 mg by nebulization every 6 (six) hours as needed for wheezing or shortness of breath.    Historical Provider, MD  HYDROcodone-acetaminophen (LORTAB 7.5) 7.5-500 MG per tablet Take 1 tablet by mouth every 4 (four) hours as needed for pain. 08/17/14   Elpidio Anis, PA-C  losartan (COZAAR) 100 MG tablet Take 100 mg by mouth daily.    Historical Provider, MD  methocarbamol (ROBAXIN) 500 MG tablet Take 1 tablet (500 mg total) by mouth 2 (two) times daily. 04/19/15   Fayrene Helper, PA-C  ondansetron (ZOFRAN ODT) 4 MG disintegrating tablet 4mg  ODT q4 hours prn nausea/vomit 11/11/14   Loren Racer, MD  oxyCODONE-acetaminophen (PERCOCET) 5-325 MG per tablet Take 1-2 tablets by mouth every 4 (four) hours as needed for severe pain. 11/11/14   Loren Racer, MD  tamsulosin (FLOMAX) 0.4 MG CAPS capsule Take 1 capsule (0.4 mg total) by mouth daily. 11/11/14   Loren Racer, MD   Triage Vitals: BP 134/86 mmHg  Pulse 77  Temp(Src) 97.9 F (36.6 C) (Oral)  Resp 18  SpO2 98% Physical Exam  Constitutional: He is oriented to person, place, and time. He appears well-developed and well-nourished.  HENT:  Head: Normocephalic.  Eyes: EOM are normal.  Neck: Normal range of motion.  Pulmonary/Chest: Effort normal.  Abdominal: He exhibits no distension.  Musculoskeletal: Normal range of motion.  Left Achilles tendon is intact. There is some scar tissue but also around Achilles. Full range of motion of the ankle. Strength is intact with flexion and extension  of the ankle. Negative Thompson's sign. DP pulses intact. No bony tenderness.  Neurological: He is alert and oriented to person, place, and time.  Psychiatric: He has a normal mood and affect.  Nursing note and vitals reviewed.   ED Course  Procedures (including critical care time) DIAGNOSTIC STUDIES: Oxygen Saturation is 98% on RA, RA by my interpretation.    COORDINATION OF CARE: 3:41 PM: Discussed treatment plan which  includes ice, elevation, avoiding climbing stairs or going uphill with left leg, jumping, antiinflammatories, ortho referral, applying ace bandage to affected area and home exercises with pt at bedside; patient verbalizes understanding and agrees with treatment plan.  MDM   Final diagnoses:  Tendonitis, Achilles, left    patient emergency department complaining of left Achilles pain after doing some hill running. Based on exam I do not think there is any rupture or partial tear to his Achilles. Most likely tendinitis from overuse. Instructed to start exercises, ice, elevate, avoid strenuous activity. Will start on mobic. Follow up with orthopedics.   Filed Vitals:   08/05/15 1504  BP: 134/86  Pulse: 77  Temp: 97.9 F (36.6 C)  Resp: [address] PA-C 08/05/15 2032  Lyndal Pulley, MD 08/06/15 1505

## 2015-11-04 IMAGING — CT CT ABD-PELV W/ CM
1 of 2 series · 15 of 32 positions shown, 19 images · IV contrast (100 ML OMNI 300)
Comparison: None

CLINICAL DATA: Right lower quadrant pain

EXAM:
CT ABDOMEN AND PELVIS WITH CONTRAST
TECHNIQUE: Multidetector CT imaging of the abdomen and pelvis was performed
using the standard protocol following bolus administration of
intravenous contrast.
CONTRAST:  100mL OMNIPAQUE IOHEXOL 300 MG/ML  SOLN

[Series 2: abd/pel with · axial · 0.76mm/px · z∈[-488,-82]mm · 15 of 89 slices shown, 19 images]
[im 4/89  soft-tissue]
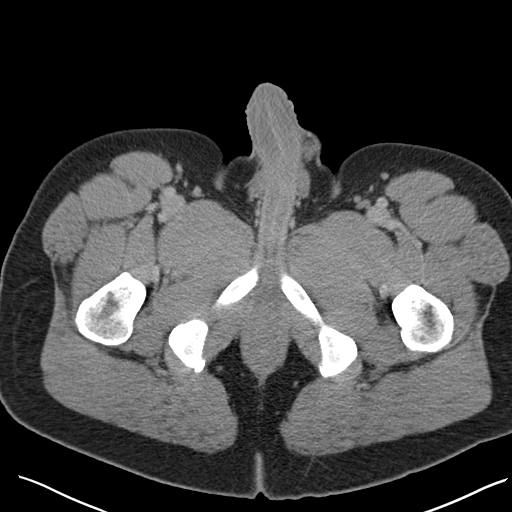
[im 4/89  bone]
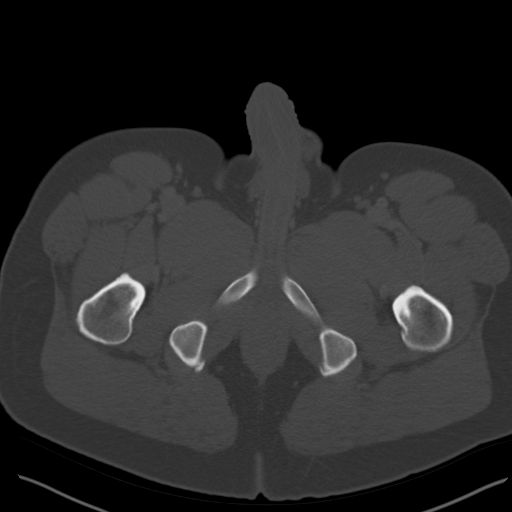
[im 11/89  soft-tissue]
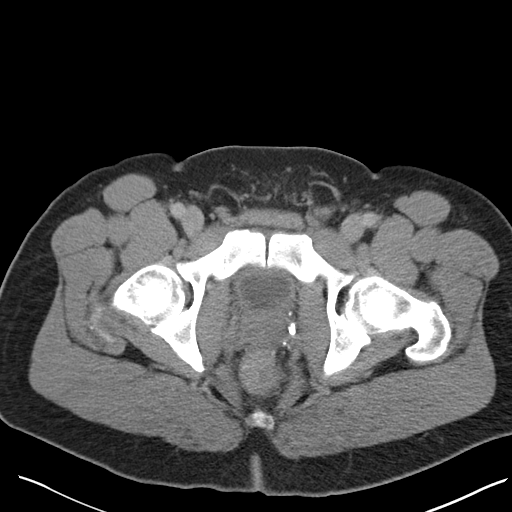
[im 18/89  soft-tissue]
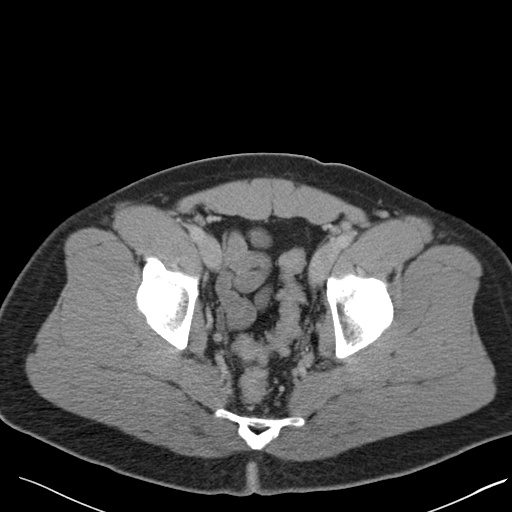
[im 25/89  soft-tissue]
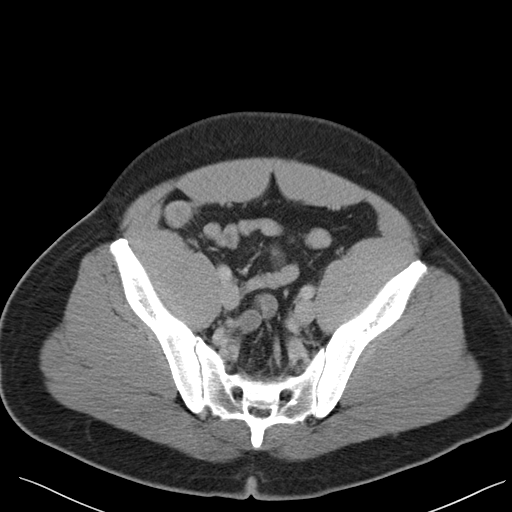
[im 32/89  soft-tissue]
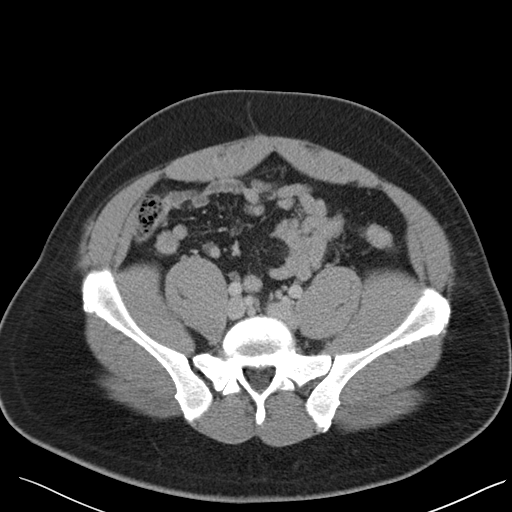
[im 39/89  soft-tissue]
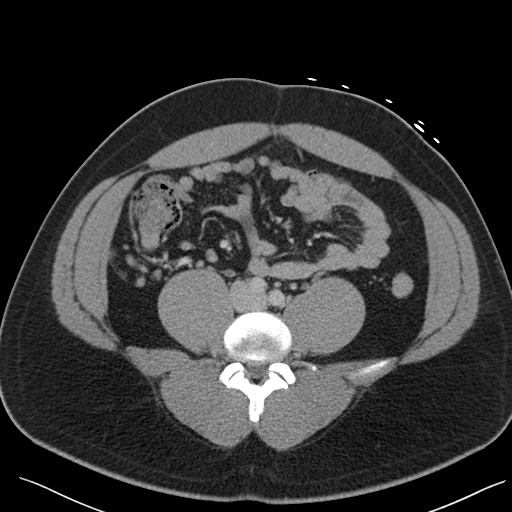
[im 46/89  soft-tissue]
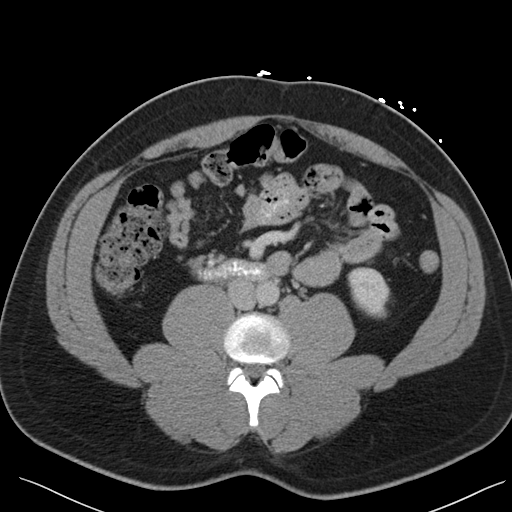
[im 50/89  soft-tissue]
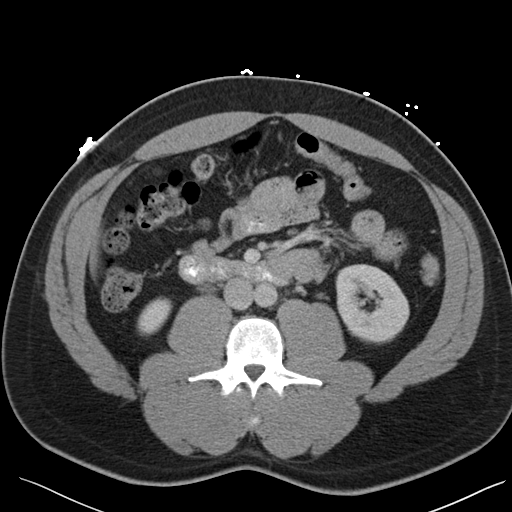
[im 57/89  soft-tissue]
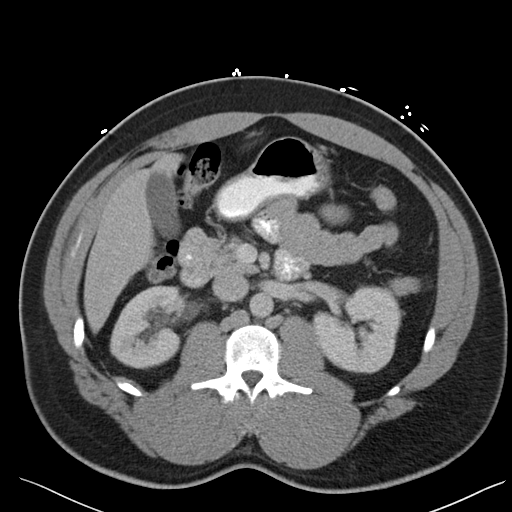
[im 57/89  bone]
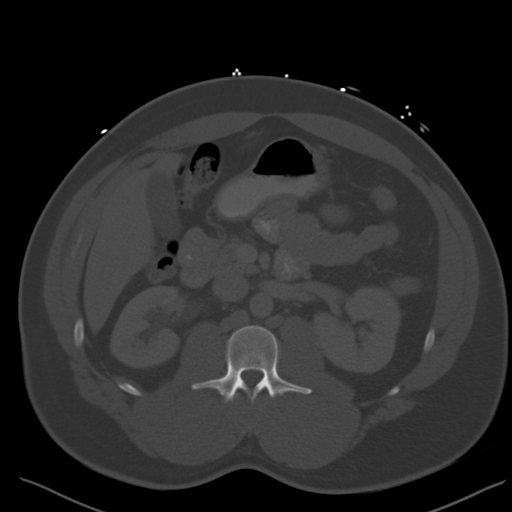
[im 64/89  soft-tissue]
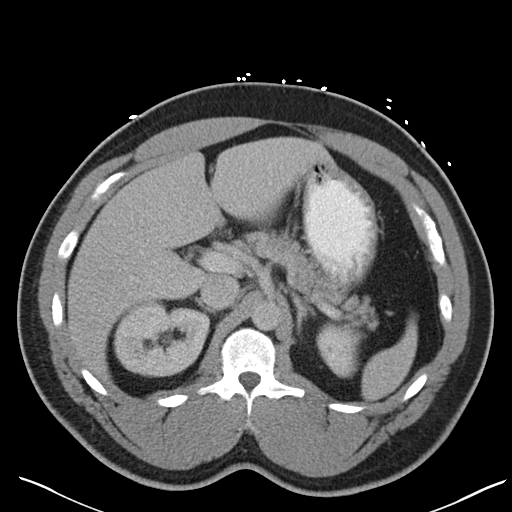
[im 71/89  soft-tissue]
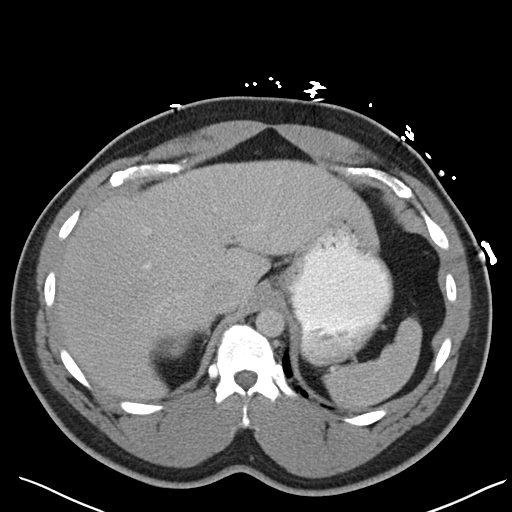
[im 74/89  lung]
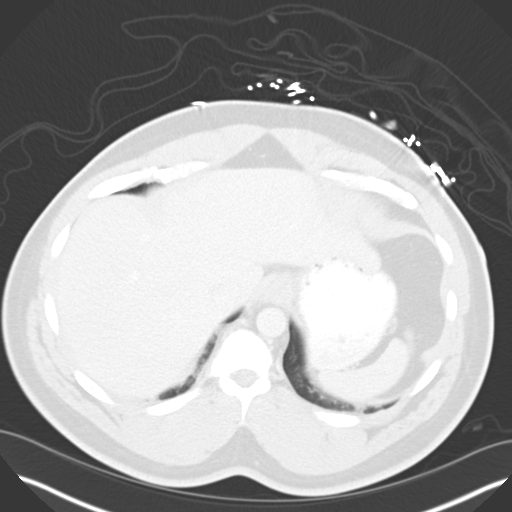
[im 78/89  soft-tissue]
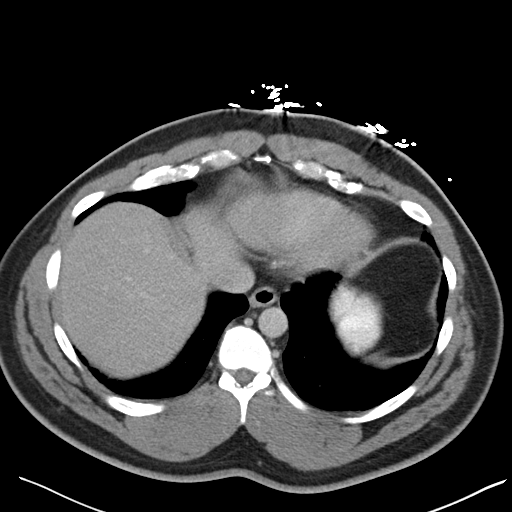
[im 78/89  lung]
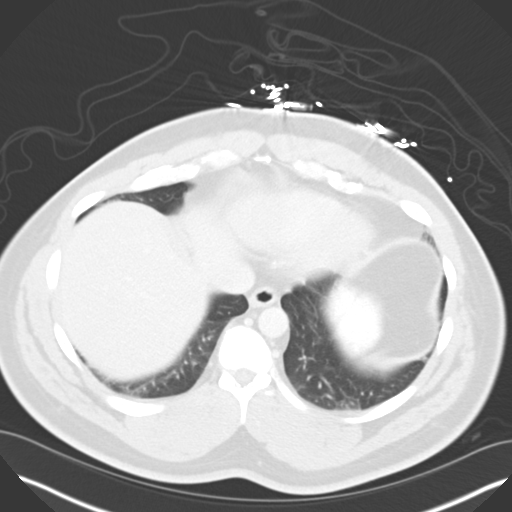
[im 81/89  lung]
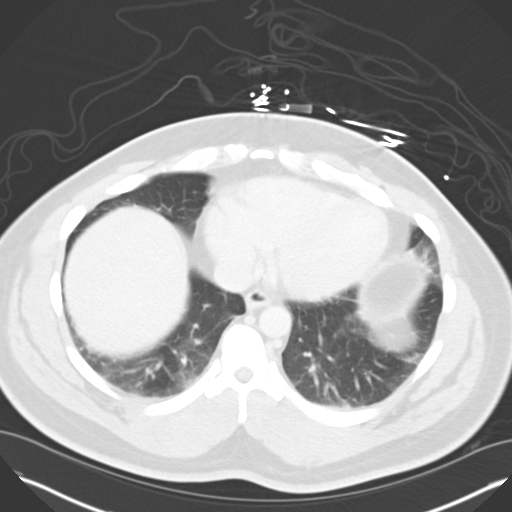
[im 85/89  soft-tissue]
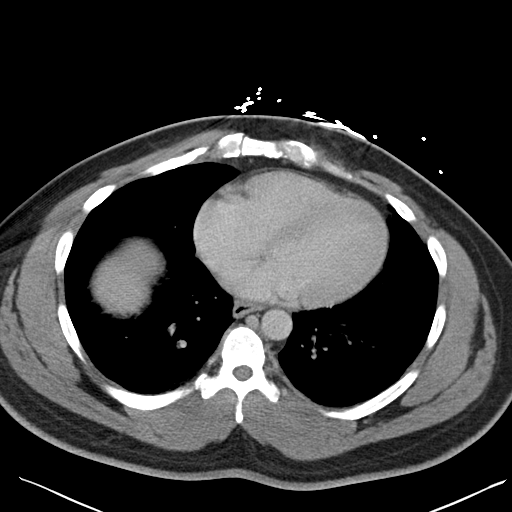
[im 85/89  lung]
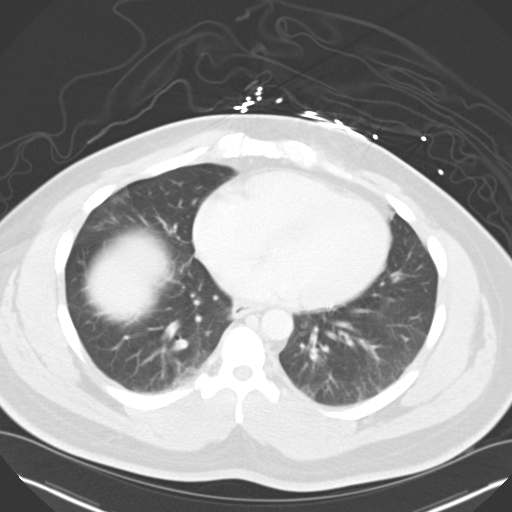

[15 of 32 positions shown; findings below may reference images not displayed]

FINDINGS: Lower chest: Lung bases are clear. No pleural or pericardial
effusion.

Hepatobiliary: There is no focal liver abnormality. The gallbladder
appears normal. No biliary dilatation.

Pancreas: Normal appearance of the pancreas.

Spleen: The spleen appears normal.

Adrenals/Urinary Tract: The adrenal glands are both normal.
Bilateral renal cysts are noted. There is right pelvocaliectasis and
proximal hydroureter. At the right UVJ there is a stone which
measures 5 mm, image 76/series 2.

Stomach/Bowel: The stomach is within normal limits. The small bowel
loops have a normal course and caliber. No obstruction. Normal
appearance of the colon. The appendix is visualized and appears
normal.

Vascular/Lymphatic: Normal appearance of the abdominal aorta. No
enlarged retroperitoneal or mesenteric adenopathy. No enlarged
pelvic or inguinal lymph nodes.

Reproductive: The prostate gland and seminal vesicles appear normal.

Other: There is no ascites or focal fluid collections within the
abdomen or pelvis. There is a small umbilical hernia which contains
fat only.

Musculoskeletal: Visualized osseous structures are on unremarkable.
IMPRESSION: 1. Right UVJ calculus measures 5 mm. This causes mild right
hydroureter and pelvocaliectasis.
2. Small fat containing umbilical hernia.

## 2015-12-26 ENCOUNTER — Other Ambulatory Visit: Payer: Self-pay | Admitting: *Deleted

## 2015-12-26 MED ORDER — ALBUTEROL SULFATE (2.5 MG/3ML) 0.083% IN NEBU: 2.5000 mg | INHALATION_SOLUTION | Freq: Four times a day (QID) | RESPIRATORY_TRACT | Status: DC | PRN

## 2015-12-26 NOTE — Telephone Encounter (Signed)
Patient called in needs rx refill for [facility] neb [facility] [address]

## 2016-02-10 ENCOUNTER — Encounter: Payer: Self-pay | Admitting: Allergy and Immunology

## 2016-02-10 ENCOUNTER — Ambulatory Visit (INDEPENDENT_AMBULATORY_CARE_PROVIDER_SITE_OTHER): Payer: Federal, State, Local not specified - PPO | Admitting: Allergy and Immunology

## 2016-02-10 ENCOUNTER — Ambulatory Visit: Payer: Self-pay | Admitting: Allergy and Immunology

## 2016-02-10 VITALS — BP 132/90 | HR 88 | Resp 16

## 2016-02-10 DIAGNOSIS — J4551 Severe persistent asthma with (acute) exacerbation: Secondary | ICD-10-CM

## 2016-02-10 DIAGNOSIS — H1010 Acute atopic conjunctivitis, unspecified eye: Secondary | ICD-10-CM | POA: Diagnosis not present

## 2016-02-10 DIAGNOSIS — J309 Allergic rhinitis, unspecified: Secondary | ICD-10-CM

## 2016-02-10 MED ORDER — FLUTICASONE PROPIONATE 50 MCG/ACT NA SUSP: NASAL | Status: DC

## 2016-02-10 MED ORDER — MONTELUKAST SODIUM 10 MG PO TABS: ORAL_TABLET | ORAL | Status: DC

## 2016-02-10 MED ORDER — METHYLPREDNISOLONE ACETATE 80 MG/ML IJ SUSP
80.0000 mg | Freq: Once | INTRAMUSCULAR | Status: AC
  Administered 2016-02-10: 80 mg via INTRAMUSCULAR

## 2016-02-10 MED ORDER — ADVAIR HFA 230-21 MCG/ACT IN AERO: INHALATION_SPRAY | RESPIRATORY_TRACT | Status: DC

## 2016-02-10 MED ORDER — ALBUTEROL SULFATE HFA 108 (90 BASE) MCG/ACT IN AERS: INHALATION_SPRAY | RESPIRATORY_TRACT | Status: DC

## 2016-02-10 NOTE — Patient Instructions (Addendum)
  1. Depo-Medrol 80 IM delivered in clinic today  2. Flonase 1-2 sprays each nostril one time per day  3. Montelukast 10 mg one tablet once a day  4. Advair 230 2 inhalations twice a day with spacer.   5. If needed:   A. pro-air HFA 2 puffs or albuterol nebulization every 4-6 hours  B. over-the-counter antihistamine  6. Further treatment?  7. Nucala?, Xolair?, Immunotherapy?  8. Return to clinic in 6 months or earlier if problem

## 2016-02-10 NOTE — Progress Notes (Signed)
Follow-up Note  Referring Provider: No ref. provider found Primary Provider: No PCP Per Patient Date of Office Visit: 02/10/2016  Subjective:   Gerald Palmer (DOB: 20-Nov-1976) is a 40 y.o. male [facility] in re-evaluation of the following:  HPI Comments: Gerald Palmer returns to this clinic in reevaluation of his severe asthma and allergic rhinoconjunctivitis. As is usually the case he is developed a significant problem with his lungs are manifested as shortness of breath and dyspnea on exertion and coughing and wheezing and using his bronchodilator 4 times per day for the past 3 weeks. He has not had any associated fever or ugly sputum production or chest pain. He has had nasal congestion and sneezing without any ugly nasal discharge or fever. He ran out of his Flonase one month ago. He has not been using his Virgel Bouquet because of an expense issue. He states that he was doing relatively well prior to this flareup for the past 6 months and did not require a systemic steroid during that interval to control his asthma. He would use his bronchodilator several times per week.    Current outpatient prescriptions:  .  albuterol (PROVENTIL) (2.5 MG/3ML) 0.083% nebulizer solution, Take 3 mLs (2.5 mg total) by nebulization every 6 (six) hours as needed for wheezing or shortness of breath., Disp: 75 mL, Rfl: 0 .  HYDROcodone-acetaminophen (LORTAB 7.5) 7.5-500 MG per tablet, Take 1 tablet by mouth every 4 (four) hours as needed for pain., Disp: 15 tablet, Rfl: 0 .  losartan (COZAAR) 100 MG tablet, Take 100 mg by mouth daily., Disp: , Rfl:  .  meloxicam (MOBIC) 15 MG tablet, Take 1 tablet (15 mg total) by mouth daily., Disp: 20 tablet, Rfl: 0 .  ADVAIR HFA 230-21 MCG/ACT inhaler, Inhale two puffs twice daily to prevent cough or wheeze. Rinse mouth after use. Use with spacer., Disp: 12 g, Rfl: 5 .  albuterol (PROAIR HFA) 108 (90 Base) MCG/ACT inhaler, Inhale two  puffs every 4-6 hours if needed for cough or wheeze., Disp: 1 Inhaler, Rfl: 1 .  fluticasone (FLONASE) 50 MCG/ACT nasal spray, Use 1-2 sprays in each nostril once daily, Disp: 16 g, Rfl: 5 .  montelukast (SINGULAIR) 10 MG tablet, Take one tablet once daily, Disp: 30 tablet, Rfl: 5  Meds ordered this encounter  Medications  . fluticasone (FLONASE) 50 MCG/ACT nasal spray    Sig: Use 1-2 sprays in each nostril once daily    Dispense:  16 g    Refill:  5  . montelukast (SINGULAIR) 10 MG tablet    Sig: Take one tablet once daily    Dispense:  30 tablet    Refill:  5  . ADVAIR HFA 230-21 MCG/ACT inhaler    Sig: Inhale two puffs twice daily to prevent cough or wheeze. Rinse mouth after use. Use with spacer.    Dispense:  12 g    Refill:  5  . albuterol (PROAIR HFA) 108 (90 Base) MCG/ACT inhaler    Sig: Inhale two puffs every 4-6 hours if needed for cough or wheeze.    Dispense:  1 Inhaler    Refill:  1  . methylPREDNISolone acetate (DEPO-MEDROL) injection 80 mg    Sig:     Past Medical History  Diagnosis Date  . Asthma   . Hypertension     Past Surgical History  Procedure Laterality Date  . Achilles tendon surgery    . Tonsillectomy    .  Ankle fracture surgery Right     Allergies  Allergen Reactions  . Ibuprofen Hives    High doses cause hives  . Shellfish Allergy Itching    Eyes swell    Review of systems negative except as noted in HPI / PMHx or noted below:  Review of Systems  Constitutional: Negative.   HENT: Negative.   Eyes: Negative.   Respiratory: Negative.   Cardiovascular: Negative.   Gastrointestinal: Negative.   Genitourinary: Negative.   Musculoskeletal: Negative.   Skin: Negative.   Neurological: Negative.   Endo/Heme/Allergies: Negative.   Psychiatric/Behavioral: Negative.      Objective:   Filed Vitals:   02/10/16 1704  BP: 132/90  Pulse: 88  Resp: 16          Physical Exam  Constitutional: He is well-developed, well-nourished, and  in no distress.  Allergic shiners  HENT:  Head: Normocephalic.  Right Ear: Tympanic membrane, external ear and ear canal normal.  Left Ear: Tympanic membrane, external ear and ear canal normal.  Nose: Mucosal edema present. No rhinorrhea.  Mouth/Throat: Uvula is midline, oropharynx is clear and moist and mucous membranes are normal. No oropharyngeal exudate.  Eyes: Conjunctivae are normal.  Neck: Trachea normal. No tracheal tenderness present. No tracheal deviation present. No thyromegaly present.  Cardiovascular: Normal rate, regular rhythm, S1 normal, S2 normal and normal heart sounds.   No murmur heard. Pulmonary/Chest: No stridor. No respiratory distress. He has wheezes (Bilateral expiratory wheezing in all lung fields). He has no rales.  Musculoskeletal: He exhibits no edema.  Lymphadenopathy:       Head (right side): No tonsillar adenopathy present.       Head (left side): No tonsillar adenopathy present.    He has no cervical adenopathy.    He has no axillary adenopathy.  Neurological: He is alert. Gait normal.  Skin: No rash noted. He is not diaphoretic. No erythema. Nails show no clubbing.  Psychiatric: Mood and affect normal.    Diagnostics:    Spirometry was performed and demonstrated an FEV1 of 1.79 at 54 % of predicted.  The patient had an Asthma Control Test with the following results: ACT Total Score: 12.    Assessment and Plan:   1. Asthma, not well controlled, severe persistent, with acute exacerbation   2. Allergic rhinoconjunctivitis     1. Depo-Medrol 80 IM delivered in clinic today  2. Flonase 1-2 sprays each nostril one time per day  3. Montelukast 10 mg one tablet once a day  4. Advair 230 2 inhalations twice a day with spacer.   5. If needed:   A. pro-air HFA 2 puffs or albuterol nebulization every 4-6 hours  B. over-the-counter antihistamine  6. Further treatment?  7. Nucala?, Xolair?, Immunotherapy?  8. Return to clinic in 6 months or  earlier if problem  Gerald Palmer has another flare of his asthma and we'll treat him with the therapy mentioned above and hopefully getting him to consistently use his anti-inflammatory medications for his respiratory tract inflammation. I once again had a talk with him today about other options for treating his atopic inflammation including the use of biological agents. We have had this discussion in the past and he has never followed through with obtaining blood tests to see if he qualifies for these types of therapies. As well, we had a talk today about using immunotherapy and at this point he is not extremely interested in starting this form of treatment. He actually appears to understand quite  well about his disease state and how the medications work. His issue is more of a financial issue than a true noncompliance issue as he has limited financial resources to purchase his medications. If he does well I will see him back in this clinic in 6 months or earlier if there is a problem.  Laurette Schimke, MD [facility]

## 2016-06-09 ENCOUNTER — Other Ambulatory Visit: Payer: Self-pay | Admitting: *Deleted

## 2016-06-09 ENCOUNTER — Other Ambulatory Visit: Payer: Self-pay | Admitting: Allergy and Immunology

## 2016-06-09 MED ORDER — IPRATROPIUM-ALBUTEROL 0.5-2.5 (3) MG/3ML IN SOLN: 3.0000 mL | RESPIRATORY_TRACT | Status: DC | PRN

## 2016-08-03 ENCOUNTER — Ambulatory Visit (INDEPENDENT_AMBULATORY_CARE_PROVIDER_SITE_OTHER): Payer: Federal, State, Local not specified - PPO | Admitting: Allergy and Immunology

## 2016-08-03 ENCOUNTER — Encounter (INDEPENDENT_AMBULATORY_CARE_PROVIDER_SITE_OTHER): Payer: Self-pay

## 2016-08-03 ENCOUNTER — Encounter: Payer: Self-pay | Admitting: Allergy and Immunology

## 2016-08-03 VITALS — BP 130/90 | HR 64 | Resp 20 | Ht 67.76 in | Wt 251.0 lb

## 2016-08-03 DIAGNOSIS — H1010 Acute atopic conjunctivitis, unspecified eye: Secondary | ICD-10-CM | POA: Diagnosis not present

## 2016-08-03 DIAGNOSIS — J4551 Severe persistent asthma with (acute) exacerbation: Secondary | ICD-10-CM

## 2016-08-03 DIAGNOSIS — J309 Allergic rhinitis, unspecified: Secondary | ICD-10-CM

## 2016-08-03 DIAGNOSIS — Z91018 Allergy to other foods: Secondary | ICD-10-CM

## 2016-08-03 MED ORDER — IPRATROPIUM-ALBUTEROL 0.5-2.5 (3) MG/3ML IN SOLN: RESPIRATORY_TRACT | 1 refills | Status: DC

## 2016-08-03 MED ORDER — ALBUTEROL SULFATE HFA 108 (90 BASE) MCG/ACT IN AERS: INHALATION_SPRAY | RESPIRATORY_TRACT | 1 refills | Status: DC

## 2016-08-03 MED ORDER — METHYLPREDNISOLONE ACETATE 80 MG/ML IJ SUSP
80.0000 mg | Freq: Once | INTRAMUSCULAR | Status: AC
Start: 2016-08-03 — End: 2016-08-03
  Administered 2016-08-03: 80 mg via INTRAMUSCULAR

## 2016-08-03 NOTE — Patient Instructions (Addendum)
  1. Depo-Medrol 80 IM delivered in clinic today  2. Nasonex 1-2 sprays each nostril one time per day  3. Montelukast 10 mg one tablet once a day  4. Advair 230 2 inhalations twice a day with spacer.   5. If needed:   A. pro-air HFA 2 puffs or albuterol nebulization every 4-6 hours  B. over-the-counter antihistamine  C. Epi-Pen  6. Check CBC with differential and IgE level  7. Obtain fall flu vaccine  8. Return to clinic in 6 months or earlier if problem

## 2016-08-03 NOTE — Progress Notes (Signed)
Follow-up Note  Referring Provider: No ref. provider found Primary Provider: No PCP Per Patient Date of Office Visit: 08/03/2016  Subjective:   Gerald Palmer (DOB: 08/24/1976) is a 40 y.o. male who [facility]2016 in re-evaluation of the following:  HPI: Gerald Palmer returns to this clinic in reevaluation of his severe asthma and allergic rhinoconjunctivitis. I've not seen him in this clinic since February.  He was doing relatively well regarding his asthma up until about 2 months ago. At that point in time he started to develop problems with recurrent wheezing and coughing and shortness of breath and and increase in his requirement for bronchodilator 4 times per day and he wakes up every week with problems with asthma. Unfortunately, over the course of the past month or so he's been out of his Advair.  As well, he has developed problems with stuffiness and nasal congestion affecting his left side greater than his right side along with a frontal headache over the course of the past week. There is not been any loss of ability to smell or high fever or ugly nasal discharge.  He remains away from consuming shellfish.    Medication List      ADVAIR HFA 230-21 MCG/ACT inhaler Generic drug:  fluticasone-salmeterol Inhale two puffs twice daily to prevent cough or wheeze. Rinse mouth after use. Use with spacer.   albuterol 108 (90 Base) MCG/ACT inhaler Commonly known as:  PROAIR HFA Inhale two puffs every 4-6 hours if needed for cough or wheeze.   HYDROcodone-acetaminophen 7.5-500 MG tablet Commonly known as:  LORTAB 7.5 Take 1 tablet by mouth every 4 (four) hours as needed for pain.   ipratropium-albuterol 0.5-2.5 (3) MG/3ML Soln Commonly known as:  DUONEB Take 3 mLs by nebulization every 4 (four) hours as needed.   montelukast 10 MG tablet Commonly known as:  SINGULAIR Take one tablet once daily   NASONEX 50 MCG/ACT nasal spray Generic  drug:  mometasone Place 2 sprays into the nose daily.       Past Medical History:  Diagnosis Date  . Asthma   . Hypertension     Past Surgical History:  Procedure Laterality Date  . ACHILLES TENDON SURGERY    . ANKLE FRACTURE SURGERY Right   . TONSILLECTOMY      Allergies  Allergen Reactions  . Ibuprofen Hives    High doses cause hives  . Shellfish Allergy Itching    Eyes swell    Review of systems negative except as noted in HPI / PMHx or noted below:  Review of Systems  Constitutional: Negative.   HENT: Negative.   Eyes: Negative.   Respiratory: Negative.   Cardiovascular: Negative.   Gastrointestinal: Negative.   Genitourinary: Negative.   Musculoskeletal: Negative.   Skin: Negative.   Neurological: Negative.   Endo/Heme/Allergies: Negative.   Psychiatric/Behavioral: Negative.      Objective:   Vitals:   08/03/16 1516  BP: 130/90  Pulse: 64  Resp: 20   Height: 5' 7.76" (172.1 cm)  Weight: 251 lb (113.9 kg)   Physical Exam  Constitutional: He is well-developed, well-nourished, and in no distress.  HENT:  Head: Normocephalic.  Right Ear: Tympanic membrane, external ear and ear canal normal.  Left Ear: Tympanic membrane, external ear and ear canal normal.  Nose: Mucosal edema (Erythematous) present. No rhinorrhea.  Mouth/Throat: Uvula is midline, oropharynx is clear and moist and mucous membranes are normal. No oropharyngeal exudate.  Eyes: Conjunctivae  are normal.  Neck: Trachea normal. No tracheal tenderness present. No tracheal deviation present. No thyromegaly present.  Cardiovascular: Normal rate, regular rhythm, S1 normal, S2 normal and normal heart sounds.   No murmur heard. Pulmonary/Chest: No stridor. No respiratory distress. He has wheezes (Expiratory wheezes). He has no rales.  Musculoskeletal: He exhibits no edema.  Lymphadenopathy:       Head (right side): No tonsillar adenopathy present.       Head (left side): No tonsillar  adenopathy present.    He has no cervical adenopathy.  Neurological: He is alert. Gait normal.  Skin: No rash noted. He is not diaphoretic. No erythema. Nails show no clubbing.  Psychiatric: Mood and affect normal.    Diagnostics:    Spirometry was performed and demonstrated an FEV1 of 2.21 at 67 % of predicted.   Assessment and Plan:   1. Asthma, not well controlled, severe persistent, with acute exacerbation   2. Allergic rhinoconjunctivitis   3. Food allergy     1. Depo-Medrol 80 IM delivered in clinic today  2. Nasonex 1-2 sprays each nostril one time per day  3. Montelukast 10 mg one tablet once a day  4. Advair 230 2 inhalations twice a day with spacer.   5. If needed:   A. pro-air HFA 2 puffs or albuterol nebulization every 4-6 hours  B. over-the-counter antihistamine  C. Epi-pen  6. Check CBC with differential and IgE level  7. Obtain fall flu vaccine  8. Return to clinic in 6 months or earlier if problem  Gerald Palmer has once again developed a exacerbation of his asthma and I've had another talk with him today about the need to consider alternate therapy for his asthma including the use of biological agents. I've encouraged him once again to obtain blood tests in investigation of his qualification for these biological agents including checking a CBC with differential and looking at his eosinophil count and checking a serum IgE level. I have given him a systemic steroid and he'll continue to use anti-inflammatory medications for both his upper and lower airways as specified above. If he does well I will see him back in this clinic around December but if he has  problems during the interval he'll will contact me prior to that visit.  Laurette SchimkeEric Kozlow, MD [facility]

## 2016-08-04 LAB — CBC WITH DIFFERENTIAL/PLATELET
Basophils Absolute: 0 10*3/uL (ref 0.0–0.2)
Basos: 0 %
EOS (ABSOLUTE): 0.2 10*3/uL (ref 0.0–0.4)
Eos: 3 %
Hematocrit: 43.8 % (ref 37.5–51.0)
Hemoglobin: 14.7 g/dL (ref 12.6–17.7)
Immature Grans (Abs): 0 10*3/uL (ref 0.0–0.1)
Immature Granulocytes: 0 %
Lymphocytes Absolute: 2.8 10*3/uL (ref 0.7–3.1)
Lymphs: 42 %
MCH: 27.4 pg (ref 26.6–33.0)
MCHC: 33.6 g/dL (ref 31.5–35.7)
MCV: 82 fL (ref 79–97)
MONOS ABS: 0.4 10*3/uL (ref 0.1–0.9)
Monocytes: 7 %
NEUTROS ABS: 3.2 10*3/uL (ref 1.4–7.0)
Neutrophils: 48 %
Platelets: 351 10*3/uL (ref 150–379)
RBC: 5.36 x10E6/uL (ref 4.14–5.80)
RDW: 14.4 % (ref 12.3–15.4)
WBC: 6.7 10*3/uL (ref 3.4–10.8)

## 2016-08-04 LAB — IGE: IGE (IMMUNOGLOBULIN E), SERUM: 184 [IU]/mL — AB (ref 0–100)

## 2016-09-01 ENCOUNTER — Encounter: Payer: Self-pay | Admitting: *Deleted

## 2016-11-18 DIAGNOSIS — M10071 Idiopathic gout, right ankle and foot: Secondary | ICD-10-CM | POA: Diagnosis not present

## 2016-11-18 DIAGNOSIS — J0140 Acute pansinusitis, unspecified: Secondary | ICD-10-CM | POA: Diagnosis not present

## 2016-11-18 DIAGNOSIS — J4531 Mild persistent asthma with (acute) exacerbation: Secondary | ICD-10-CM | POA: Diagnosis not present

## 2016-12-15 ENCOUNTER — Other Ambulatory Visit: Payer: Self-pay | Admitting: Allergy and Immunology

## 2016-12-16 ENCOUNTER — Other Ambulatory Visit: Payer: Self-pay | Admitting: Allergy and Immunology

## 2017-02-09 ENCOUNTER — Ambulatory Visit (INDEPENDENT_AMBULATORY_CARE_PROVIDER_SITE_OTHER): Payer: Federal, State, Local not specified - PPO | Admitting: Allergy & Immunology

## 2017-02-09 ENCOUNTER — Encounter: Payer: Self-pay | Admitting: Allergy & Immunology

## 2017-02-09 VITALS — BP 158/98 | HR 82 | Temp 98.6°F | Resp 18 | Ht 67.0 in | Wt 261.6 lb

## 2017-02-09 DIAGNOSIS — J019 Acute sinusitis, unspecified: Secondary | ICD-10-CM

## 2017-02-09 DIAGNOSIS — J3089 Other allergic rhinitis: Secondary | ICD-10-CM

## 2017-02-09 DIAGNOSIS — J4540 Moderate persistent asthma, uncomplicated: Secondary | ICD-10-CM

## 2017-02-09 DIAGNOSIS — J45998 Other asthma: Secondary | ICD-10-CM | POA: Diagnosis not present

## 2017-02-09 MED ORDER — AMOXICILLIN-POT CLAVULANATE 875-125 MG PO TABS: 1.0000 | ORAL_TABLET | Freq: Two times a day (BID) | ORAL | 0 refills | Status: AC

## 2017-02-09 NOTE — Patient Instructions (Addendum)
1. Moderate persistent asthma without complication - Lung testing was consistent with an asthma attack today.  - Lung function did improve with nebulizer treatment. - Start the prednisone pack provided today. - We will have Tammy call you again to discuss the monthly injectable medication - Nucala. - We provided samples of Symbicort 160/4.5 since we did not have samples of the Advair today. - Go back to the Advair once you are done with the Symbicort.  - Daily controller medication(s): Advair 230/21 two puffs once daily with spacer - Rescue medications: ProAir 4 puffs every 4-6 hours as needed or albuterol nebulizer one vial puffs every 4-6 hours as needed - Changes during respiratory infections or worsening symptoms: increase Advair 230/21 to 2 puffs twice daily for TWO WEEKS. - Asthma control goals:  * Full participation in all desired activities (may need albuterol before activity) * Albuterol use two time or less a week on average (not counting use with activity) * Cough interfering with sleep two time or less a month * Oral steroids no more than once a year * No hospitalizations  2. Perennial allergic rhinitis - Continue with Nasonex two sprays per nostril once daily.  - Continue with Singulair 10mg  daily  3. Acute sinusitis - Start Augmentin one tablet twice daily for 14 days.  - Use nasal saline rinses. - Use Mucinex twice daily for 1-2 weeks to help with mucous clearance.  4. Return in about 4 months (around 06/09/2017).  Please inform us of any Emergency Department visits, hospitalizations, or changes in symptoms. Call us before going to the ED for breathing or allergy symptoms since we might be able to fit you in for a sick visit. Feel free to contact us anytime with any questions, problems, or concerns.  It was a pleasure to meet you today! Best wishes in the [place]!   Websites that have reliable patient information: 1. American Academy of Asthma, Allergy, and Immunology:  www.aaaai.org 2. Food Allergy Research and Education (FARE): foodallergy.org 3. Mothers of Asthmatics: http://www.asthmacommunitynetwork.org 4. American College of Allergy, Asthma, and Immunology: www.acaai.org

## 2017-02-09 NOTE — Progress Notes (Signed)
FOLLOW UP  Date of Service/Encounter:  02/09/17   Assessment:   Moderate persistent asthma without complication  Perennial allergic rhinitis  Acute sinusitis  Elevated blood pressure today (was on losartan in the past)   Asthma Reportables:  Severity: moderate persistent  Risk: low Control: not well controlled  Seasonal Influenza Vaccine: yes    Plan/Recommendations:   1. Moderate persistent asthma without complication - Lung testing was consistent with a mild asthma exacerbation today, and numbers did improve slightly upon giving the nebulizer treatment.  - Prednisone burst started in clinic today.  - Gerald Palmer is still interested in getting Nucala, therefore I will have Tammy call him again. - We provided samples of Symbicort 160/4.5 since we did not have samples of the Advair today. - I recommended that he restart the the Advair once he is done with the Symbicort.   - We compromised and I got Gerald Palmer to agree to use two puffs once daily of his controller medication to make it last longer. - Daily controller medication(s): Advair 230/21 two puffs once daily with spacer - Rescue medications: ProAir 4 puffs every 4-6 hours as needed or albuterol nebulizer one vial puffs every 4-6 hours as needed - Changes during respiratory infections or worsening symptoms: increase Advair 230/21 to 2 puffs twice daily for TWO WEEKS. - Asthma control goals:  * Full participation in all desired activities (may need albuterol before activity) * Albuterol use two time or less a week on average (not counting use with activity) * Cough interfering with sleep two time or less a month * Oral steroids no more than once a year * No hospitalizations  2. Perennial allergic rhinitis - Continue with Nasonex two sprays per nostril once daily.  - Continue with Singulair 10mg  daily   3. Acute sinusitis - Start Augmentin one tablet twice daily for 14 days.  - Use nasal saline rinses. - Use  Mucinex twice daily for 1-2 weeks to help with mucous clearance.  4. Elevated blood pressure - Gerald Palmer has an appointment with his PCP in the next coupe of weeks.  - He also has some losartan at home that he can restart.  5. Return in about 4 months (around 06/09/2017).  Subjective:   Gerald Palmer is a 41 y.o. male presenting today for follow up of  Chief Complaint  Patient presents with  . Asthma    6 mth f/u  . Allergic Rhinitis     conjunctivitis f/u    Gerald Palmer has a history of the following: There are no active problems to display for this patient.   History obtained from: chart review and patient.  Gerald Palmer was referred by No PCP Per Patient.     Gerald Palmer is a 41 y.o. male presenting for a follow up visit. He was last seen by Dr. Lucie LeatherKozlow in August 2017. At that time, his asthma and allergies were not well controlled. He seems to only follow up when he has asthma attacks. At the last visit, he had run out of his Advair. He did receive intramuscular Depo-Medrol in clinic that today. He was continued on Advair 2:30/21 two inhalations twice daily, Singulair, and Nasonex. There is discussion about starting a biologic agent on him at the last visit. He was supposed to follow up in December for a follow-up visit. He did have adequate labs to initiate Nucala, but it appears that multiple phone calls were not returned by the patient.  Since the last visit,  he has been somewhat stable. He has not needed steroids since the last visit. He is not using his Advair consistently due to the copay ($35 -$41). He cannot use copay cards due to his insurance with the [employer] (a government agency which does not qualify for the copay cards). His dust environment does tend to bother his symptoms. He estimates that he is "on the truck" for 3 out of 5 days per week. He ran out in early December or January. He has tried Qvar in the past but only with worsening respiratory flares. He is still  interested in getting a monthly injection.    He was on Nasonex and is pretty good at remembering that medication since it is cheaper than the asthma medication. He remains on Singulair 10mg  daily. Otherwise, there have been no changes to his past medical history, surgical history, family history, or social history. He estimates that he needs 3-4 antibiotic courses per year.     Review of Systems: a 14-point review of systems is pertinent for what is mentioned in HPI.  Otherwise, all other systems were negative. Constitutional: negative other than that listed in the HPI Eyes: negative other than that listed in the HPI Ears, nose, mouth, throat, and face: negative other than that listed in the HPI Respiratory: negative other than that listed in the HPI Cardiovascular: negative other than that listed in the HPI Gastrointestinal: negative other than that listed in the HPI Genitourinary: negative other than that listed in the HPI Integument: negative other than that listed in the HPI Hematologic: negative other than that listed in the HPI Musculoskeletal: negative other than that listed in the HPI Neurological: negative other than that listed in the HPI Allergy/Immunologic: negative other than that listed in the HPI    Objective:   Blood pressure (!) 158/98, pulse 82, temperature 98.6 F (37 C), temperature source Oral, resp. rate 18, height 5\' 7"  (1.702 m), weight 261 lb 9.6 oz (118.7 kg), SpO2 95 %. Body mass index is 40.97 kg/m.   Physical Exam:  General: Alert, interactive, in no acute distress. Cooperative with the exam.  Eyes: No conjunctival injection present on the right, No conjunctival injection present on the left, PERRL bilaterally, No discharge on the right, No discharge on the left and No Horner-Trantas dots present Ears: Right TM pearly gray with normal light reflex, Left TM pearly gray with normal light reflex, Right TM intact without perforation and Left TM intact  without perforation.  Nose/Throat: External nose within normal limits, nasal crease present and septum midline, turbinates edematous and pale with crusty discharge, post-pharynx erythematous with cobblestoning in the posterior oropharynx. Tonsils 2+ without exudates Neck: Supple without thyromegaly. Lungs: Decreased breath sounds with expiratory wheezing bilaterally. No increased work of breathing. CV: Normal S1/S2, no murmurs. Capillary refill <2 seconds.  Skin: Warm and dry, without lesions or rashes. Neuro:   Grossly intact. No focal deficits appreciated. Responsive to questions.   Diagnostic studies:  Spirometry: results abnormal (FEV1: 2.10/67%, FVC: 3.07/81%, FEV1/FVC: 68%).    Spirometry consistent with normal pattern. Albuterol/Atrovent nebulizer treatment given in clinic with no improvement. His FEV1 and FVC but increased 5%, although this is not significant per ATS criteria.  Allergy Studies: None     Malachi Bonds, MD [facility]

## 2017-02-15 ENCOUNTER — Telehealth: Payer: Self-pay | Admitting: Allergy & Immunology

## 2017-02-15 NOTE — Telephone Encounter (Signed)
Left message for patient to call back  

## 2017-02-15 NOTE — Telephone Encounter (Signed)
Patient saw Dr. Dellis Anes 2-21 and needed FML paperwork filled out. He was told it would be done last Friday. He said he called and they told him it would be done Monday (today). It is not up front. He would like to know the status of this.

## 2017-02-15 NOTE — Telephone Encounter (Signed)
I have placed on Dr. Ellouise NewerGallagher's desk. Will have him take a look at it and should be ready tomorrow.

## 2017-02-16 NOTE — Telephone Encounter (Signed)
Paperwork is completed and I have left a message for the patient advising that paperwork will be up front for him to pick up at his convenience.

## 2017-03-08 ENCOUNTER — Telehealth: Payer: Self-pay | Admitting: *Deleted

## 2017-03-08 NOTE — Telephone Encounter (Signed)
Per Dr Dellis AnesGallagher and patient signed for for start of Nucala.  I called patient to make sure he would follow through this time with contacting pharmacy to ok delivery since I had previously tried to start patient on Nucala in Sept 2017 and had left 3 messages and mailed letter with no response. This time i left messages on 02/09/17, 02/25/17, 03/02/17 with no response from patient. I will go ahead and file patient to inactive since he has not responded will figure he in not interested in starting therapy

## 2017-04-12 ENCOUNTER — Telehealth: Payer: Self-pay | Admitting: Allergy and Immunology

## 2017-04-12 NOTE — Telephone Encounter (Signed)
Pt called and said that he needed to talk with Dr.Kozlow [phone]

## 2017-04-12 NOTE — Telephone Encounter (Signed)
Please asked patient if he can come in for a visit as it has been over 6 months since I've seen him in this clinic. If there is some issue that needs to be addressed today please let me know what it is.

## 2017-04-12 NOTE — Telephone Encounter (Signed)
Patient last saw Dr.Gallagher on 02-09-17. He needs FMLA papers filled out. Previous papers filled out in February were incorrect. I will place FMLA papers on Dr. Kathyrn Lass desk.

## 2017-05-18 ENCOUNTER — Other Ambulatory Visit: Payer: Self-pay

## 2017-05-18 MED ORDER — ALBUTEROL SULFATE HFA 108 (90 BASE) MCG/ACT IN AERS: INHALATION_SPRAY | RESPIRATORY_TRACT | 1 refills | Status: DC

## 2017-05-18 MED ORDER — MONTELUKAST SODIUM 10 MG PO TABS: ORAL_TABLET | ORAL | 5 refills | Status: DC

## 2017-05-18 NOTE — Telephone Encounter (Signed)
Patient was seen on 02-09-2017 by Dr. Dellis AnesGallagher. Patient is calling because he doesn't have any refills for his ProAir, Singular and Symbicort.   [facility]

## 2017-05-18 NOTE — Telephone Encounter (Signed)
I tried calling patients number [phone] twice and it said it was disconnected. Then I called [phone] and it stated the mailbox was full. I sent a refill in for Montelukast and Proair. I was trying to get in contact with patient to clarify with the daily inhaler. Patient was last seen 02/09/17 with Dr. Dellis Palmer. In the notes it stated after Symbicort sample he recommended Gerald Palmer go back on Advair. Patient does have an appointment coming up on 06/15/2017.

## 2017-05-19 NOTE — Telephone Encounter (Signed)
Called patient again on number listed. I was given the disconnection signal.

## 2017-05-23 NOTE — Telephone Encounter (Signed)
I called patient again at [phone]. I left a message stating that montelukast and Proair had been sent in. We did need patient to call back about controller inhaler. Unsure is Symbicort or Advair is needed.

## 2017-05-25 NOTE — Telephone Encounter (Signed)
Called patient again. No answer. Disconnected. I am sending a letter advising patient to call us.

## 2017-06-09 ENCOUNTER — Ambulatory Visit: Payer: Federal, State, Local not specified - PPO | Admitting: Allergy & Immunology

## 2017-06-15 ENCOUNTER — Ambulatory Visit: Payer: Federal, State, Local not specified - PPO | Admitting: Allergy & Immunology

## 2017-07-09 DIAGNOSIS — M7662 Achilles tendinitis, left leg: Secondary | ICD-10-CM | POA: Diagnosis not present

## 2017-07-10 ENCOUNTER — Other Ambulatory Visit: Payer: Self-pay | Admitting: Allergy and Immunology

## 2017-08-09 DIAGNOSIS — J019 Acute sinusitis, unspecified: Secondary | ICD-10-CM | POA: Diagnosis not present

## 2017-09-24 DIAGNOSIS — J019 Acute sinusitis, unspecified: Secondary | ICD-10-CM | POA: Diagnosis not present

## 2017-09-24 DIAGNOSIS — M7661 Achilles tendinitis, right leg: Secondary | ICD-10-CM | POA: Diagnosis not present

## 2017-10-04 ENCOUNTER — Encounter: Payer: Self-pay | Admitting: Family Medicine

## 2017-10-04 ENCOUNTER — Ambulatory Visit (INDEPENDENT_AMBULATORY_CARE_PROVIDER_SITE_OTHER): Payer: Federal, State, Local not specified - PPO | Admitting: Family Medicine

## 2017-10-04 VITALS — BP 120/90 | HR 67 | Ht 67.0 in | Wt 259.6 lb

## 2017-10-04 DIAGNOSIS — H1132 Conjunctival hemorrhage, left eye: Secondary | ICD-10-CM | POA: Diagnosis not present

## 2017-10-04 NOTE — Progress Notes (Signed)
Subjective:  Gerald Palmer is a 41 y.o. male who presents today with a chief complaint of left eye injury and to establish care.   HPI:  Left Eye Injury, Acute Issue Injured 3 days ago. Daughter accidentally hit him in the eye with her hand. He had severe pain initially, however this has improved over the last couple of days. Still has a mild amount of pain. He tried putting some ice on the area which has helped with the swelling. He did have some blurred vision initially however this is now back to normal. No discharge. No fevers or chills. No photophobia.  ROS: Per history of present illness otherwise a 14 point review of systems was performed and was negative  PMH:  The following were reviewed and entered/updated in epic: Past Medical History:  Diagnosis Date  . Asthma   . Hypertension    There are no active problems to display for this patient.  Past Surgical History:  Procedure Laterality Date  . ACHILLES TENDON SURGERY    . ANKLE FRACTURE SURGERY Right   . TONSILLECTOMY      Family History  Problem Relation Age of Onset  . Hypertension Mother   . Arthritis Mother   . Diabetes Mother   . Hyperlipidemia Mother   . Asthma Father   . Hyperlipidemia Father   . Hypertension Father   . Stroke Father   . Hypertension Brother   . Hyperlipidemia Brother   . Arthritis Maternal Grandmother     Medications- reviewed and updated Current Outpatient Prescriptions  Medication Sig Dispense Refill  . albuterol (PROAIR HFA) 108 (90 Base) MCG/ACT inhaler INHALE 2 PUFFS INTO THE LUNGS EVERY 4-6 HOURS AS NEEDED FOR COUGH OR WHEEZE 8.5 g 1  . ipratropium-albuterol (DUONEB) 0.5-2.5 (3) MG/3ML SOLN Inhale the contents of one vial in nebulizer every four to six hours as needed for cough or wheeze. 180 mL 1  . mometasone (NASONEX) 50 MCG/ACT nasal spray Place 2 sprays into the nose daily.    . montelukast (SINGULAIR) 10 MG tablet Take one tablet once daily 30 tablet 5  . PROAIR HFA  108 (90 Base) MCG/ACT inhaler INHALE 2 PUFFS INTO THE LUNGS EVERY 4-6 HOURS AS NEEDED FOR COUGH OR WHEEZE 8.5 g 0   No current facility-administered medications for this visit.    Allergies-reviewed and updated Allergies  Allergen Reactions  . Ibuprofen Hives    High doses cause hives  . Shellfish Allergy Itching    Eyes swell    Social History   Social History  . Marital status: Single    Spouse name: N/A  . Number of children: N/A  . Years of education: N/A   Social History Main Topics  . Smoking status: Former Games developer  . Smokeless tobacco: Never Used  . Alcohol use Yes     Comment: occasionally  . Drug use: No  . Sexual activity: Yes   Other Topics Concern  . None   Social History Narrative  . None   Objective:  Physical Exam: BP 120/90   Pulse 67   Ht  (1.702 m)   Wt 259 lb 9.6 oz (117.8 kg)   SpO2 97%   BMI 40.66 kg/m   Gen: NAD, resting comfortably HEENT: Right eye normal. Left eye with subconjunctival hemorrhage along the nasal aspect. Extraocular eye movements intact without pain. Pupils equally reactive to light and accommodation. No discharge noted. Fluoroscien eye exam performed without apparent laceration, foreign body, or abrasion. Visual  acuity was tested and was normal for patient. CV: RRR with no murmurs appreciated Pulm: NWOB, CTAB with no crackles, wheezes, or rhonchi GI: Normal bowel sounds present. Soft, Nontender, Nondistended. MSK: No edema, cyanosis, or clubbing noted Skin: Warm, dry Neuro: Grossly normal, moves all extremities Psych: Normal affect and thought content  Assessment/Plan:  Subconjunctival Hemorrhage No red flags today. Normal fluorescein exam without corneal laceration, foreign body, or abrasion. Reassured patient. Encouraged supportive care with cold compresses, and over-the-counter analgesics. Return precautions reviewed. Follow-up as needed.  Preventative healthcare Recommended patient follow-up soon for  comprehensive physical exam.  Sekai Nayak M. [name], MD 10/04/2017 3:37 PM

## 2017-10-04 NOTE — Patient Instructions (Signed)

## 2017-10-11 ENCOUNTER — Other Ambulatory Visit: Payer: Self-pay | Admitting: Family Medicine

## 2017-10-11 ENCOUNTER — Ambulatory Visit (INDEPENDENT_AMBULATORY_CARE_PROVIDER_SITE_OTHER): Payer: Federal, State, Local not specified - PPO | Admitting: Family Medicine

## 2017-10-11 ENCOUNTER — Encounter: Payer: Self-pay | Admitting: Family Medicine

## 2017-10-11 DIAGNOSIS — J4540 Moderate persistent asthma, uncomplicated: Secondary | ICD-10-CM | POA: Diagnosis not present

## 2017-10-11 DIAGNOSIS — J45909 Unspecified asthma, uncomplicated: Secondary | ICD-10-CM | POA: Insufficient documentation

## 2017-10-11 MED ORDER — BECLOMETHASONE DIPROPIONATE 80 MCG/ACT IN AERS: 2.0000 | INHALATION_SPRAY | Freq: Two times a day (BID) | RESPIRATORY_TRACT | 12 refills | Status: DC

## 2017-10-11 MED ORDER — IPRATROPIUM-ALBUTEROL 0.5-2.5 (3) MG/3ML IN SOLN: RESPIRATORY_TRACT | 1 refills | Status: DC

## 2017-10-11 MED ORDER — ALBUTEROL SULFATE HFA 108 (90 BASE) MCG/ACT IN AERS: INHALATION_SPRAY | RESPIRATORY_TRACT | 5 refills | Status: DC

## 2017-10-11 MED ORDER — MONTELUKAST SODIUM 10 MG PO TABS: ORAL_TABLET | ORAL | 5 refills | Status: DC

## 2017-10-11 MED ORDER — PREDNISONE 50 MG PO TABS: ORAL_TABLET | ORAL | 0 refills | Status: DC

## 2017-10-11 NOTE — Patient Instructions (Addendum)
Start the prednisone and qvar. Use albuterol every 2-4 hours as needed.  Let me know if your symptoms worsen or are not improving.  Take care,  Dr Jimmey RalphParker   Asthma, Adult Asthma is a condition of the lungs in which the airways tighten and narrow. Asthma can make it hard to breathe. Asthma cannot be cured, but medicine and lifestyle changes can help control it. Asthma may be started (triggered) by:  Animal skin flakes (dander).  Dust.  Cockroaches.  Pollen.  Mold.  Smoke.  Cleaning products.  Hair sprays or aerosol sprays.  Paint fumes or strong smells.  Cold air, weather changes, and winds.  Crying or laughing hard.  Stress.  Certain medicines or drugs.  Foods, such as dried fruit, potato chips, and sparkling grape juice.  Infections or conditions (colds, flu).  Exercise.  Certain medical conditions or diseases.  Exercise or tiring activities.  Follow these instructions at home:  Take medicine as told by your doctor.  Use a peak flow meter as told by your doctor. A peak flow meter is a tool that measures how well the lungs are working.  Record and keep track of the peak flow meter's readings.  Understand and use the asthma action plan. An asthma action plan is a written plan for taking care of your asthma and treating your attacks.  To help prevent asthma attacks: ? Do not smoke. Stay away from secondhand smoke. ? Change your heating and air conditioning filter often. ? Limit your use of fireplaces and wood stoves. ? Get rid of pests (such as roaches and mice) and their droppings. ? Throw away plants if you see mold on them. ? Clean your floors. Dust regularly. Use cleaning products that do not smell. ? Have someone vacuum when you are not home. Use a vacuum cleaner with a HEPA filter if possible. ? Replace carpet with wood, tile, or vinyl flooring. Carpet can trap animal skin flakes and dust. ? Use allergy-proof pillows, mattress covers, and box  spring covers. ? Wash bed sheets and blankets every week in hot water and dry them in a dryer. ? Use blankets that are made of polyester or cotton. ? Clean bathrooms and kitchens with bleach. If possible, have someone repaint the walls in these rooms with mold-resistant paint. Keep out of the rooms that are being cleaned and painted. ? Wash hands often. Contact a doctor if:  You have make a whistling sound when breaking (wheeze), have shortness of breath, or have a cough even if taking medicine to prevent attacks.  The colored mucus you cough up (sputum) is thicker than usual.  The colored mucus you cough up changes from clear or white to yellow, green, gray, or bloody.  You have problems from the medicine you are taking such as: ? A rash. ? Itching. ? Swelling. ? Trouble breathing.  You need reliever medicines more than 2-3 times a week.  Your peak flow measurement is still at 50-79% of your personal best after following the action plan for 1 hour.  You have a fever. Get help right away if:  You seem to be worse and are not responding to medicine during an asthma attack.  You are short of breath even at rest.  You get short of breath when doing very little activity.  You have trouble eating, drinking, or talking.  You have chest pain.  You have a fast heartbeat.  Your lips or fingernails start to turn blue.  You are light-headed,  dizzy, or faint.  Your peak flow is less than 50% of your personal best. This information is not intended to replace advice given to you by your health care provider. Make sure you discuss any questions you have with your health care provider. Document Released: 05/24/2008 Document Revised: 05/13/2016 Document Reviewed: 07/05/2013 Elsevier Interactive Patient Education  2017 [employer].

## 2017-10-11 NOTE — Assessment & Plan Note (Signed)
Symptoms consistent with mild flare today.  Start 5-day course of prednisone.  Also start Qvar.  Continue albuterol, Singulair, and DuoNeb as needed.  Has follow-up with pulmonologist in a few months.  Consider PFTs have not been done recently.  Strict return precautions reviewed.  Follow-up as needed.

## 2017-10-11 NOTE — Progress Notes (Signed)
    Subjective:  Gerald Palmer is a 41 y.o. male who presents today with a chief complaint of asthma.   HPI:  Asthma Flare Flared up with weather change over the last 5 days. He is having tightness in his chest and some wheezing. He has been using albuterol about 10 times daily and nebulizer twice.  Nonproductive cough.  Mild rhinorrhea.  Pulmonologist but has not seen them in about a year.  Will be following up with him in a few months.  ROS: Per HPI  PMH: Smoking history reviewed.  Former smoker  Objective:  Physical Exam: BP 130/90   Pulse 81   Ht 5\' 7"  (1.702 m)   Wt 261 lb 9.6 oz (118.7 kg)   SpO2 95%   BMI 40.97 kg/m   Gen: NAD, resting comfortably CV: RRR with no murmurs appreciated Pulm: NWOB, CTAB with no crackles, wheezes, or rhonchi MSK: No edema, cyanosis, or clubbing noted Skin: Warm, dry Neuro: Grossly normal, moves all extremities Psych: Normal affect and thought content  Assessment/Plan:  Asthma Symptoms consistent with mild flare today.  Start 5-day course of prednisone.  Also start Qvar.  Continue albuterol, Singulair, and DuoNeb as needed.  Has follow-up with pulmonologist in a few months.  Consider PFTs have not been done recently.  Strict return precautions reviewed.  Follow-up as needed.  Gerald [name] M. Jimmey RalphParker, MD 10/11/2017 10:55 AM

## 2017-10-12 ENCOUNTER — Telehealth: Payer: Self-pay | Admitting: Family Medicine

## 2017-10-12 ENCOUNTER — Other Ambulatory Visit: Payer: Self-pay

## 2017-10-12 MED ORDER — BECLOMETHASONE DIPROPIONATE 80 MCG/ACT IN AERS: 2.0000 | INHALATION_SPRAY | Freq: Two times a day (BID) | RESPIRATORY_TRACT | 12 refills | Status: DC

## 2017-10-12 NOTE — Telephone Encounter (Signed)
Pharmacy called in reference to beclomethasone (QVAR) 80 MCG/ACT inhaler. Pharmacy stated this is no longer available and would like to know if this can be changed to the ready inhaler. Please call pharmacy and advise.

## 2017-10-12 NOTE — Telephone Encounter (Signed)
Okay to switch to the [employer] Inhaler?

## 2017-10-13 ENCOUNTER — Other Ambulatory Visit: Payer: Self-pay

## 2017-10-13 MED ORDER — BECLOMETHASONE DIPROP HFA 80 MCG/ACT IN AERB: 2.0000 | INHALATION_SPRAY | Freq: Two times a day (BID) | RESPIRATORY_TRACT | 11 refills | Status: DC

## 2017-10-13 NOTE — Telephone Encounter (Signed)
Rx sent in.  [name], MD 10/13/2017 1:01 PM

## 2017-10-13 NOTE — Telephone Encounter (Signed)
Did you sent in inhaler? I did not see on list?

## 2017-10-13 NOTE — Telephone Encounter (Signed)
Pharmacy called in inquiring about the regular QVAR beclomethasone (QVAR) 80 MCG/ACT inhaler but it's no longer available. Can pharmacy give patient the REDIHALER? Call pharmacy to advise due to them not receiving the request approval. Patient is still without medication.  [id number] [place] - [id number] [facility] [address] [facility] [address] [phone] (Phone) [phone] (Fax)

## 2017-10-13 NOTE — Telephone Encounter (Signed)
Redihaler sent in.  [name], MD 10/13/2017 8:40 AM

## 2017-11-14 ENCOUNTER — Encounter: Payer: Self-pay | Admitting: Allergy and Immunology

## 2017-11-14 ENCOUNTER — Ambulatory Visit: Payer: Federal, State, Local not specified - PPO | Admitting: Allergy and Immunology

## 2017-11-14 VITALS — BP 128/74 | HR 82 | Temp 97.8°F | Resp 17

## 2017-11-14 DIAGNOSIS — J019 Acute sinusitis, unspecified: Secondary | ICD-10-CM | POA: Insufficient documentation

## 2017-11-14 DIAGNOSIS — J0100 Acute maxillary sinusitis, unspecified: Secondary | ICD-10-CM

## 2017-11-14 DIAGNOSIS — J45901 Unspecified asthma with (acute) exacerbation: Secondary | ICD-10-CM

## 2017-11-14 DIAGNOSIS — J3089 Other allergic rhinitis: Secondary | ICD-10-CM | POA: Diagnosis not present

## 2017-11-14 MED ORDER — BUDESONIDE-FORMOTEROL FUMARATE 160-4.5 MCG/ACT IN AERO
2.0000 | INHALATION_SPRAY | Freq: Two times a day (BID) | RESPIRATORY_TRACT | 5 refills | Status: DC
Start: 2017-11-14 — End: 2018-09-05

## 2017-11-14 MED ORDER — AZELASTINE HCL 0.1 % NA SOLN: 1.0000 | Freq: Two times a day (BID) | NASAL | 5 refills | Status: DC | PRN

## 2017-11-14 MED ORDER — IPRATROPIUM-ALBUTEROL 0.5-2.5 (3) MG/3ML IN SOLN: RESPIRATORY_TRACT | 1 refills | Status: DC

## 2017-11-14 MED ORDER — FLUTICASONE PROPIONATE HFA 110 MCG/ACT IN AERO
2.0000 | INHALATION_SPRAY | Freq: Two times a day (BID) | RESPIRATORY_TRACT | 5 refills | Status: DC
Start: 2017-11-14 — End: 2018-07-06

## 2017-11-14 MED ORDER — ALBUTEROL SULFATE HFA 108 (90 BASE) MCG/ACT IN AERS: INHALATION_SPRAY | RESPIRATORY_TRACT | 1 refills | Status: DC

## 2017-11-14 MED ORDER — PREDNISONE 1 MG PO TABS: 10.0000 mg | ORAL_TABLET | Freq: Every day | ORAL | Status: AC

## 2017-11-14 NOTE — Patient Instructions (Addendum)
Asthma with acute exacerbation  Prednisone has been provided, 40 mg x3 days, 20 mg x1 day, 10 mg x1 day, then stop.  A sample and refill prescription has been provided for Symbicort 160-4.5 g, 2 inhalations via spacer device twice daily.  For now, and during respiratory tract infections or asthma flares, add Flovent 110g 2 inhalations 2 times per day until symptoms have returned to baseline.  Continue montelukast 10 mg daily at bedtime and albuterol every 4-6 hours as needed.  Information has been provided for Nucala and we will submit insurance paperwork.  The patient has been asked to contact me if his symptoms persist or progress. Otherwise, he may return for follow up in 4 months.  Allergic rhinitis Currently with suboptimal control.  Continue appropriate allergen avoidance measures and montelukast 10 mg daily at bedtime.  A prescription has been provided for azelastine nasal spray, 1-2 sprays per nostril 2 times daily as needed. Proper nasal spray technique has been discussed and demonstrated.   Nasal saline lavage (NeilMed) has been recommended as needed and prior to medicated nasal sprays along with instructions for proper administration.   Return in about 4 months (around 03/14/2018), or if symptoms worsen or fail to improve.

## 2017-11-14 NOTE — Progress Notes (Signed)
Follow-up Note  RE: Gerald Palmer MRN: [id number] DOB: September 11, 1976 Date of Office Visit: 11/14/2017  Primary care provider: Ardith DarkParker, Caleb M, MD Referring provider: Ardith DarkParker, Caleb M, MD  History of present illness: Gerald Palmer is a 41 y.o. male with persistent asthma and allergic rhinosinusitis presenting today for sick visit.  He was last seen in this clinic on February 09, 2017.  He reports that over the past 5 days he has experienced frequent asthma symptoms.  The asthma symptoms have been bothering him throughout the days and have awakened him from sleep at nighttime on multiple occasions.  He is also experiencing increased nasal congestion, rhinorrhea, and thick postnasal drainage.  He denies fevers, chills, and discolored mucus production.  He admits that he ran out of Symbicort 160-4.5 g approximately 1 month ago.  He is taking montelukast on a daily basis.   Assessment and plan: Asthma with acute exacerbation  Prednisone has been provided, 40 mg x3 days, 20 mg x1 day, 10 mg x1 day, then stop.  A sample and refill prescription has been provided for Symbicort 160-4.5 g, 2 inhalations via spacer device twice daily.  For now, and during respiratory tract infections or asthma flares, add Flovent 110g 2 inhalations 2 times per day until symptoms have returned to baseline.  Continue montelukast 10 mg daily at bedtime and albuterol every 4-6 hours as needed.  Information has been provided for Nucala and we will submit insurance paperwork.  The patient has been asked to contact me if his symptoms persist or progress. Otherwise, he may return for follow up in 4 months.  Allergic rhinitis Currently with suboptimal control.  Continue appropriate allergen avoidance measures and montelukast 10 mg daily at bedtime.  A prescription has been provided for azelastine nasal spray, 1-2 sprays per nostril 2 times daily as needed. Proper nasal spray technique has been discussed and  demonstrated.   Nasal saline lavage (NeilMed) has been recommended as needed and prior to medicated nasal sprays along with instructions for proper administration.   Meds ordered this encounter  Medications  . budesonide-formoterol (SYMBICORT) 160-4.5 MCG/ACT inhaler    Sig: Inhale 2 puffs into the lungs 2 (two) times daily.    Dispense:  1 Inhaler    Refill:  5  . fluticasone (FLOVENT HFA) 110 MCG/ACT inhaler    Sig: Inhale 2 puffs into the lungs 2 (two) times daily.    Dispense:  1 Inhaler    Refill:  5  . azelastine (ASTELIN) 0.1 % nasal spray    Sig: Place 1-2 sprays into both nostrils 2 (two) times daily as needed for rhinitis.    Dispense:  30 mL    Refill:  5  . albuterol (PROAIR HFA) 108 (90 Base) MCG/ACT inhaler    Sig: INHALE 2 PUFFS INTO THE LUNGS EVERY 4-6 HOURS AS NEEDED FOR COUGH OR WHEEZE    Dispense:  8.5 g    Refill:  1  . ipratropium-albuterol (DUONEB) 0.5-2.5 (3) MG/3ML SOLN    Sig: INHALE THE CONTENTS OF ONE VIAL IN NEBULIZER EVERY 4 TO 6 HOURS AS NEEDED FOR COUGH OR WHEEZE    Dispense:  540 mL    Refill:  1  . predniSONE (DELTASONE) tablet 10 mg    Diagnostics: Spirometry reveals an FVC of 2.03 L (51% predicted) and an FEV1 of 1.01 L (31% predicted) with significant postbronchodilator improvement.  Please see scanned spirometry results for details.    Physical examination: Blood pressure 128/74, pulse 82,  temperature 97.8 F (36.6 C), resp. rate 17, SpO2 94 %.  General: Alert, interactive, in no acute distress. HEENT: TMs pearly gray, turbinates moderately edematous with thick discharge, post-pharynx mildly erythematous. Neck: Supple without lymphadenopathy. Lungs: Decreased breath sounds bilaterally without wheezing, rhonchi or rales. CV: Normal S1, S2 without murmurs. Skin: Warm and dry, without lesions or rashes.  The following portions of the patient's history were reviewed and updated as appropriate: allergies, current medications, past family  history, past medical history, past social history, past surgical history and problem list.  Allergies as of 11/14/2017      Reactions   Ibuprofen Hives   High doses cause hives   Shellfish Allergy Itching   Eyes swell      Medication List        Accurate as of 11/14/17  5:19 PM. Always use your most recent med list.          albuterol 108 (90 Base) MCG/ACT inhaler Commonly known as:  PROAIR HFA INHALE 2 PUFFS INTO THE LUNGS EVERY 4-6 HOURS AS NEEDED FOR COUGH OR WHEEZE   azelastine 0.1 % nasal spray Commonly known as:  ASTELIN Place 1-2 sprays into both nostrils 2 (two) times daily as needed for rhinitis.   beclomethasone 80 MCG/ACT inhaler Commonly known as:  QVAR REDIHALER Inhale 2 puffs into the lungs 2 (two) times daily.   budesonide-formoterol 160-4.5 MCG/ACT inhaler Commonly known as:  SYMBICORT Inhale 2 puffs into the lungs 2 (two) times daily.   fluticasone 110 MCG/ACT inhaler Commonly known as:  FLOVENT HFA Inhale 2 puffs into the lungs 2 (two) times daily.   fluticasone 50 MCG/ACT nasal spray Commonly known as:  FLONASE 1 spray by Each Nare route daily. allergy   ipratropium-albuterol 0.5-2.5 (3) MG/3ML Soln Commonly known as:  DUONEB INHALE THE CONTENTS OF ONE VIAL IN NEBULIZER EVERY 4 TO 6 HOURS AS NEEDED FOR COUGH OR WHEEZE   montelukast 10 MG tablet Commonly known as:  SINGULAIR Take one tablet once daily       Allergies  Allergen Reactions  . Ibuprofen Hives    High doses cause hives  . Shellfish Allergy Itching    Eyes swell   Review of systems: Review of systems negative except as noted in HPI / PMHx or noted below: Constitutional: Negative.  HENT: Negative.   Eyes: Negative.  Respiratory: Negative.   Cardiovascular: Negative.  Gastrointestinal: Negative.  Genitourinary: Negative.  Musculoskeletal: Negative.  Neurological: Negative.  Endo/Heme/Allergies: Negative.  Cutaneous: Negative.  Past Medical History:  Diagnosis Date    . Asthma   . Hypertension     Family History  Problem Relation Age of Onset  . Hypertension Mother   . Arthritis Mother   . Diabetes Mother   . Hyperlipidemia Mother   . Asthma Father   . Hyperlipidemia Father   . Hypertension Father   . Stroke Father   . Hypertension Brother   . Hyperlipidemia Brother   . Arthritis Maternal Grandmother     Social History   Socioeconomic History  . Marital status: Single    Spouse name: Not on file  . Number of children: Not on file  . Years of education: Not on file  . Highest education level: Not on file  Social Needs  . Financial resource strain: Not on file  . Food insecurity - worry: Not on file  . Food insecurity - inability: Not on file  . Transportation needs - medical: Not on file  . Transportation  needs - non-medical: Not on file  Occupational History  . Not on file  Tobacco Use  . Smoking status: Former Games developer  . Smokeless tobacco: Never Used  Substance and Sexual Activity  . Alcohol use: Yes    Comment: occasionally  . Drug use: No  . Sexual activity: Yes  Other Topics Concern  . Not on file  Social History Narrative  . Not on file     I appreciate the opportunity to take part in Ferd's care. Please do not hesitate to contact me with questions.  Sincerely,   R. Jorene Guest, MD

## 2017-11-14 NOTE — Assessment & Plan Note (Signed)
Currently with suboptimal control.  Continue appropriate allergen avoidance measures and montelukast 10 mg daily at bedtime.  A prescription has been provided for azelastine nasal spray, 1-2 sprays per nostril 2 times daily as needed. Proper nasal spray technique has been discussed and demonstrated.   Nasal saline lavage (NeilMed) has been recommended as needed and prior to medicated nasal sprays along with instructions for proper administration.

## 2017-11-14 NOTE — Assessment & Plan Note (Signed)
   Prednisone has been provided, 40 mg x3 days, 20 mg x1 day, 10 mg x1 day, then stop.  A sample and refill prescription has been provided for Symbicort 160-4.5 g, 2 inhalations via spacer device twice daily.  For now, and during respiratory tract infections or asthma flares, add Flovent 110g 2 inhalations 2 times per day until symptoms have returned to baseline.  Continue montelukast 10 mg daily at bedtime and albuterol every 4-6 hours as needed.  Information has been provided for Nucala and we will submit insurance paperwork.  The patient has been asked to contact me if his symptoms persist or progress. Otherwise, he may return for follow up in 4 months.

## 2017-12-30 ENCOUNTER — Encounter: Payer: Self-pay | Admitting: Family Medicine

## 2017-12-30 ENCOUNTER — Ambulatory Visit: Payer: Self-pay

## 2017-12-30 ENCOUNTER — Ambulatory Visit: Payer: Federal, State, Local not specified - PPO | Admitting: Family Medicine

## 2017-12-30 VITALS — BP 124/74 | HR 73 | Temp 98.5°F | Ht 67.0 in | Wt 268.0 lb

## 2017-12-30 DIAGNOSIS — M10061 Idiopathic gout, right knee: Secondary | ICD-10-CM

## 2017-12-30 DIAGNOSIS — M25461 Effusion, right knee: Secondary | ICD-10-CM

## 2017-12-30 DIAGNOSIS — M25569 Pain in unspecified knee: Secondary | ICD-10-CM | POA: Insufficient documentation

## 2017-12-30 DIAGNOSIS — M25561 Pain in right knee: Secondary | ICD-10-CM | POA: Diagnosis not present

## 2017-12-30 NOTE — Patient Instructions (Signed)

## 2017-12-30 NOTE — Procedures (Signed)
PROCEDURE NOTE -  ULTRASOUND GUIDEDAspiration and Injection: Right knee Images were obtained and interpreted by myself, Gaspar BiddingMichael Rigby, DO  Images have been saved and stored to PACS system. Images obtained on: GE S7 Ultrasound machine  ULTRASOUND FINDINGS:  Large effusion with generalized synovitis.  Medial lateral joint lines are overall reassuring without significant osteophytic spurring or bulging of the meniscus.  DESCRIPTION OF PROCEDURE:  The patient's clinical condition is marked by substantial pain and/or significant functional disability. Other conservative therapy has not provided relief, is contraindicated, or not appropriate. There is a reasonable likelihood that injection will significantly improve the patient's pain and/or functional impairment.  After discussing the risks, benefits and expected outcomes of the injection and all questions were reviewed and answered, the patient wished to undergo the above named procedure. Verbal consent was obtained.  The ultrasound was used to identify the target structure and adjacent neurovascular structures. The skin was then prepped in sterile fashion and the target structure was injected under direct visualization using sterile technique as below:  Right PREP: Alcohol, Ethel Chloride, 5cc 1% lidocaine on 25g 1.5 in. Needle APPROACH: superiolateral, stopcock technique, 18g 1.5in. INJECTATE:  2cc 0.5% marcaine, 2cc 40mg /mL DepoMedrol  ASPIRATE: 100 cc of straw-colored cloudy fluid consistent with gout, will send for cell count and crystals. DRESSING: Band-Aid   Post procedural instructions including recommending icing and warning signs for infection were reviewed.  This procedure was well tolerated and there were no complications.   IMPRESSION: Succesful US Guided Injection

## 2017-12-30 NOTE — Progress Notes (Signed)
Patient was seen Dr. Lavone NeriParker's request on Dr. Jimmey RalphParker schedule for knee aspiration injection.  Aspirate was consistent with gout and cell count crystal analysis was performed.  I will plan to check in with him in 6 weeks and ensure clinical resolution as well as check uric acid levels at follow-up.  May need long-term uric acid lowering agents.

## 2017-12-30 NOTE — Progress Notes (Signed)
    Subjective:  Gerald Palmer is a 42 y.o. male who presents today with a chief complaint of knee pain.   HPI:  Knee Pain, Acute Issue Pain started yesterday. Worsened over that time.  Patient states that he was moving furniture [place] of the [name] did a lot of lifting.  Also jumped out of the [name] a couple of times.  He was able to do this without any pain however yesterday morning woke up with a severely swollen and inflamed right knee.  He has tried taking Aleve which helps a little bit.  No falls or other obvious trauma.  Has a history of gout.  No other obvious alleviating or aggravating factors.  ROS: Per HPI  PMH: He reports that he has quit smoking. he has never used smokeless tobacco. He reports that he drinks alcohol. He reports that he does not use drugs.  Objective:  Physical Exam: BP 124/74 (BP Location: Left Arm, Patient Position: Sitting, Cuff Size: Large)   Pulse 73   Temp 98.5 F (36.9 C) (Oral)   Ht 5\' 7"  (1.702 m)   Wt 268 lb (121.6 kg)   SpO2 97%   BMI 41.97 kg/m   Gen: NAD, resting comfortably MSK: Right knee: Gross effusion noted.  Limited range of motion secondary to pain and swelling.  Negative anterior and posterior drawer signs.  McMurray not able to be tested due to amount of swelling.  Stable varus and valgus stress.  Assessment/Plan:  Right knee pain Concern for meniscal tear versus gout flare.  Doubt fracture given the lack of trauma.  He will be seeing Dr. Berline Choughigby for arthrocentesis with intra-articular steroid injection.  Please see his note for further details on his procedure. Symptoms most likely secondary to gout. Patient was placed in ACE wrap prior to leaving clinic. He will follow up with sports medicine in 6 weeks. If not improved with intra-articular steroid injection, will need to start colchicine. Will need uric acid level in 6-8 weeks after acute flare resolves.   [name], MD 12/30/2017 2:54 PM

## 2017-12-31 LAB — SYNOVIAL CELL COUNT + DIFF, W/ CRYSTALS
Basophils, %: 0 %
EOSINOPHILS-SYNOVIAL: 0 % (ref 0–2)
LYMPHOCYTES-SYNOVIAL FLD: 0 % (ref 0–74)
MONOCYTE/MACROPHAGE: 8 % (ref 0–69)
Neutrophil, Synovial: 92 % — ABNORMAL HIGH (ref 0–24)
SYNOVIOCYTES, %: 0 % (ref 0–15)
WBC, SYNOVIAL: 4411 {cells}/uL — AB (ref <150)

## 2018-01-02 NOTE — Progress Notes (Signed)
No crystals. Consistent with acute OA flare

## 2018-01-11 ENCOUNTER — Other Ambulatory Visit: Payer: Self-pay

## 2018-01-11 ENCOUNTER — Encounter: Payer: Self-pay | Admitting: Sports Medicine

## 2018-01-11 ENCOUNTER — Ambulatory Visit: Payer: Federal, State, Local not specified - PPO | Admitting: Sports Medicine

## 2018-01-11 ENCOUNTER — Ambulatory Visit (INDEPENDENT_AMBULATORY_CARE_PROVIDER_SITE_OTHER): Payer: Federal, State, Local not specified - PPO

## 2018-01-11 ENCOUNTER — Ambulatory Visit: Payer: Self-pay

## 2018-01-11 VITALS — BP 150/90 | HR 90 | Ht 67.0 in | Wt 261.0 lb

## 2018-01-11 DIAGNOSIS — M25561 Pain in right knee: Secondary | ICD-10-CM

## 2018-01-11 DIAGNOSIS — G8929 Other chronic pain: Secondary | ICD-10-CM | POA: Diagnosis not present

## 2018-01-11 DIAGNOSIS — M179 Osteoarthritis of knee, unspecified: Secondary | ICD-10-CM | POA: Diagnosis not present

## 2018-01-11 MED ORDER — METHOCARBAMOL 500 MG PO TABS: 500.0000 mg | ORAL_TABLET | Freq: Three times a day (TID) | ORAL | 0 refills | Status: DC | PRN

## 2018-01-11 NOTE — Progress Notes (Signed)
Gerald Palmer. [facility] at [facility] [phone]  Gerald Palmer - 42 y.o. male MRN [id number]  Date of birth: May 23, 1976  Visit Date: 01/11/2018  PCP: Ardith Dark, MD   Referred by: Ardith Dark, MD   Scribe for today's visit: Stevenson Clinch, CMA     SUBJECTIVE:  Gerald Palmer is here for No chief complaint on file.  Compared to the last office visit, his previously described symptoms of RT knee pain and swelling are: are worsening, he started doing a few miles on the treadmill last Thursday or Friday. He has been going well since the injection but when he woke yesterday he noticed sharp shooting pains in the knee. This morning when he woke up his RT hip and knee were both hurting. There is swelling around the knee. The lateral aspect of the knee is now tender to palpation.  Current symptoms are severe & are non-radiating. RT hip pain started today within the past few hours.  He had steroid injection and 100 CC fluid aspirated 12/30/17. Synovial fluid came back negative for crystals. He was prescribed Colchicine and advised to wear ACE wrap. Pt ambulating with crutches today. He has tried taking Aleve with some relief.  Pt denies erythema, increased warmth or drainage at the injection site.    ROS Reports night time disturbances. Denies fevers, chills, or night sweats. Denies unexplained weight loss. Denies personal history of cancer. Denies changes in bowel or bladder habits. Denies recent unreported falls. Denies new or worsening dyspnea or wheezing. Denies headaches or dizziness.  Denies numbness, tingling or weakness  In the extremities.  Denies dizziness or presyncopal episodes Denies lower extremity edema     HISTORY & PERTINENT PRIOR DATA:  Prior History reviewed and updated per electronic medical record.  Significant history, findings, studies and interim changes include:  reports that he has quit smoking. he has  never used smokeless tobacco. No results for input(s): HGBA1C, LABURIC, CREATINE in the last 8760 hours. No specialty comments available. Problem  Knee Pain    OBJECTIVE:  VS:  HT:5\' 7"  (170.2 cm)   WT:261 lb (118.4 kg)  BMI:40.87    BP:(!) 150/90  HR:90bpm  TEMP: ( )  RESP:96 %   PHYSICAL EXAM: Constitutional: WDWN, Non-toxic appearing. Psychiatric: Alert & appropriately interactive.  Not depressed or anxious appearing. Respiratory: No increased work of breathing.  Trachea Midline Eyes: Pupils are equal.  EOM intact without nystagmus.  No scleral icterus  NEUROVASCULAR exam: No clubbing or cyanosis appreciated No significant venous stasis changes Capillary Refill: normal, less than 2 seconds   Right knee with recurrent large effusion.  Small amount of lower extremity edema but symmetric and no significant calf tenderness.  He has medial and lateral joint line pain.    ASSESSMENT & PLAN:   1. Right knee pain, unspecified chronicity   2. Acute pain of right knee   3. Chronic pain of right knee    PLAN:    Knee pain Recurrent effusion.  Given the minimal and short duration of improvement following last injection we will plan to obtain an MRI.  Repeat aspiration performed today per procedure note.   ++++++++++++++++++++++++++++++++++++++++++++ Orders & Meds: Orders Placed This Encounter  Procedures  . DG Knee AP/LAT W/Sunrise Right  . [place] LIMITED JOINT SPACE STRUCTURES LOW RIGHT(NO LINKED CHARGES)  . MR Knee Right Wo Contrast    No orders of the defined types were placed in this encounter.   ++++++++++++++++++++++++++++++++++++++++++++  Follow-up: Return for MRI review.   Pertinent documentation may be included in additional procedure notes, imaging studies, problem based documentation and patient instructions. Please see these sections of the encounter for additional information regarding this visit. CMA/ATC served as Neurosurgeonscribe during this visit. History, Physical, and  Plan performed by medical provider. Documentation and orders reviewed and attested to.      Andrena MewsMichael D Markell Sciascia, DO    [facility]

## 2018-01-11 NOTE — Patient Instructions (Addendum)
We are ordering an MRI for you today.  The imaging office will be calling you to schedule your appointment after we obtain authorization from your insurance company.   Please be sure you have signed up for MyChart so that we can get your results to you.  We will be in touch with you as soon as we can.  Please know, it can take up to 3-4 business days for the radiologist and Dr. Berline Choughigby to have time to review the results and determine the best appropriate action.  If there is something that appears to be surgical or needs a referral to other specialists we will let you know through MyChart or telephone.  Otherwise we will plan to schedule a follow up appointment with Dr. Berline Choughigby once we have the results.      You had an injection today.  Things to be aware of after injection are listed below: . You may experience no significant improvement or even a slight worsening in your symptoms during the first 24 to 48 hours.  After that we expect your symptoms to improve gradually over the next 2 weeks for the medicine to have its maximal effect.  You should continue to have improvement out to 6 weeks after your injection. . Dr. Berline Choughigby recommends icing the site of the injection for 20 minutes  1-2 times the day of your injection . You may shower but no swimming, tub bath or Jacuzzi for 24 hours. . If your bandage falls off this does not need to be replaced.  It is appropriate to remove the bandage after 4 hours. . You may resume light activities as tolerated unless otherwise directed per Dr. Berline Choughigby during your visit  POSSIBLE STEROID SIDE EFFECTS:  Side effects from injectable steroids tend to be less than when taken orally however you may experience some of the symptoms listed below.  If experienced these should only last for a short period of time. Change in menstrual flow  Edema (swelling)  Increased appetite Skin flushing (redness)  Skin rash/acne  Thrush (oral) Yeast vaginitis    Increased  sweating  Depression Increased blood glucose levels Cramping and leg/calf  Euphoria (feeling happy)  POSSIBLE PROCEDURE SIDE EFFECTS: The side effects of the injection are usually fairly minimal however if you may experience some of the following side effects that are usually self-limited and will is off on their own.  If you are concerned please feel free to call the office with questions:  Increased numbness or tingling  Nausea or vomiting  Swelling or bruising at the injection site   Please call our office if if you experience any of the following symptoms over the next week as these can be signs of infection:   Fever greater than 100.41F  Significant swelling at the injection site  Significant redness or drainage from the injection site  If after 2 weeks you are continuing to have worsening symptoms please call our office to discuss what the next appropriate actions should be including the potential for a return office visit or other diagnostic testing.

## 2018-01-11 NOTE — Progress Notes (Unsigned)
robaxin 

## 2018-01-23 ENCOUNTER — Ambulatory Visit (INDEPENDENT_AMBULATORY_CARE_PROVIDER_SITE_OTHER): Payer: Federal, State, Local not specified - PPO

## 2018-01-23 DIAGNOSIS — M65861 Other synovitis and tenosynovitis, right lower leg: Secondary | ICD-10-CM | POA: Diagnosis not present

## 2018-01-23 DIAGNOSIS — M25561 Pain in right knee: Secondary | ICD-10-CM

## 2018-01-23 DIAGNOSIS — M25461 Effusion, right knee: Secondary | ICD-10-CM | POA: Diagnosis not present

## 2018-01-26 NOTE — Progress Notes (Signed)
MRI is reassuring that there is no significant meniscal injury but does show a moderate degree of arthritis and unfortunately is likely not amenable to arthroscopic intervention.  I would like to see how he is doing at follow-up and it appears that he has a follow-up in 2 weeks from now.  If he is having any worsening I am happy to see him sooner.  He should try to rest his knee as much as possible between now and when I see him back in 2 weeks.  Recommend trying to avoid significant knee bends and stooping

## 2018-01-27 ENCOUNTER — Telehealth: Payer: Self-pay | Admitting: Family Medicine

## 2018-01-27 NOTE — Telephone Encounter (Signed)
Spoke with patient, OV has been scheduled for 02/02/18 to discuss results/recommendations.

## 2018-01-27 NOTE — Telephone Encounter (Signed)
Copied from CRM [id number]. Topic: Quick Communication - Lab Results >> Jan 27, 2018  2:28 PM Cecelia ByarsGreen, Temeka L, RMA wrote: Patient is requesting MRi results, please return pt call

## 2018-02-02 ENCOUNTER — Ambulatory Visit: Payer: Federal, State, Local not specified - PPO | Admitting: Sports Medicine

## 2018-02-08 ENCOUNTER — Encounter: Payer: Self-pay | Admitting: Sports Medicine

## 2018-02-08 ENCOUNTER — Ambulatory Visit: Payer: Federal, State, Local not specified - PPO | Admitting: Sports Medicine

## 2018-02-08 NOTE — Procedures (Signed)
PROCEDURE NOTE:  Ultrasound Guided: Aspiration and Injection: Right knee  Images were obtained and interpreted by myself, Gaspar BiddingMichael Xane Amsden, DO  Images have been saved and stored to PACS system. Images obtained on: GE S7 Ultrasound machine  ULTRASOUND FINDINGS:  Large effusion  DESCRIPTION OF PROCEDURE:  The patient's clinical condition is marked by substantial pain and/or significant functional disability. Other conservative therapy has not provided relief, is contraindicated, or not appropriate. There is a reasonable likelihood that injection will significantly improve the patient's pain and/or functional impairment.  After discussing the risks, benefits and expected outcomes of the injection and all questions were reviewed and answered, the patient wished to undergo the above named procedure. Verbal consent was obtained.  The ultrasound was used to identify the target structure and adjacent neurovascular structures. The skin was then prepped in sterile fashion and the target structure was injected under direct visualization using sterile technique as below:   PREP: Alcohol, Ethel Chloride, 5cc 1% lidocaine on 25g 1.5 in. Needle APPROACH: superiolateral, stopcock technique, 18g 1.5in. INJECTATE: 2cc: 0.5% marcaine, 1cc: 40mg /mL Kenalog   ASPIRATE: 70 cc of slightly blood-tinged synovial fluid   DRESSING: Band-Aid &: 6-inch Ace Wrap  Post procedural instructions including recommending icing and warning signs for infection were reviewed.   This procedure was well tolerated and there were no complications.     IMPRESSION: Succesful Ultrasound Guided: Aspiration and Injection

## 2018-02-08 NOTE — Assessment & Plan Note (Signed)
Recurrent effusion.  Given the minimal and short duration of improvement following last injection we will plan to obtain an MRI.  Repeat aspiration performed today per procedure note.

## 2018-02-09 ENCOUNTER — Ambulatory Visit: Payer: Federal, State, Local not specified - PPO | Admitting: Sports Medicine

## 2018-03-21 ENCOUNTER — Encounter: Payer: Self-pay | Admitting: Family Medicine

## 2018-03-21 ENCOUNTER — Ambulatory Visit (INDEPENDENT_AMBULATORY_CARE_PROVIDER_SITE_OTHER): Payer: Federal, State, Local not specified - PPO | Admitting: Family Medicine

## 2018-03-21 VITALS — BP 132/78 | HR 85 | Temp 98.4°F | Ht 67.0 in | Wt 263.8 lb

## 2018-03-21 DIAGNOSIS — R21 Rash and other nonspecific skin eruption: Secondary | ICD-10-CM

## 2018-03-21 DIAGNOSIS — M10061 Idiopathic gout, right knee: Secondary | ICD-10-CM | POA: Diagnosis not present

## 2018-03-21 DIAGNOSIS — M109 Gout, unspecified: Secondary | ICD-10-CM | POA: Diagnosis not present

## 2018-03-21 MED ORDER — COLCHICINE 0.6 MG PO TABS: 0.6000 mg | ORAL_TABLET | Freq: Every day | ORAL | 0 refills | Status: DC

## 2018-03-21 MED ORDER — PREDNISONE 50 MG PO TABS: ORAL_TABLET | ORAL | 0 refills | Status: DC

## 2018-03-21 NOTE — Patient Instructions (Signed)
Please start the prednisone and colchicine.  Let me know if your symptoms are not improving over the next 5-7 days.  We should check a gout level in about 4-6 weeks.  Take care, Dr Jimmey RalphParker

## 2018-03-21 NOTE — Progress Notes (Signed)
    Subjective:  Gerald Palmer is a 42 y.o. male who presents today for same-day appointment with a chief complaint of foot swelling.   HPI:  Foot Swelling, Acute Issue Started 5 days ago. Stable over that time. Patient had part of a beer on his birthday and over the next 1 or 2 days started noticing pain and swelling in his right great toe.  Over the next day or 2 this progressed to involve his right foot and ankle.  Since then he has had a significant amount of pain that hs not improved.  Tried several home remedies including tart cherry and Aleve which did not help.  No falls or trauma.  Rash, acute issue Located on posterior aspect of left calf.  Very itchy.  Tried using lotion which made it worse.  ROS: Per HPI  PMH: He reports that he has quit smoking. He has never used smokeless tobacco. He reports that he drinks alcohol. He reports that he does not use drugs.  Objective:  Physical Exam: BP 132/78 (BP Location: Left Arm)   Pulse 85   Temp 98.4 F (36.9 C)   Ht 5\' 7"  (1.702 m)   Wt 263 lb 12.8 oz (119.7 kg)   SpO2 98%   BMI 41.32 kg/m   Gen: NAD, resting comfortably MSK: -Right foot: Grossly edematous.  Very tender to palpation at the first MTP joint.  Ankle with obvious effusion.  Tender to palpation.  Neurovascularly intact distally. Skin: Erythematous well demarcated plaque on posterior aspect of left calf.  Assessment/Plan:  Acute gout Exam and history consistent with gout flare. Start prednisone and colchicine. Check uric acid in 4-6 weeks after flare resolves. Will likely benefit from starting allopurinol.   Rash Consistent with mild eczema.  Advised patient to use over-the-counter cortisone as needed.  If no improvement, will need prescription for triamcinolone.  [name], MD 03/21/2018 9:01 AM

## 2018-03-21 NOTE — Assessment & Plan Note (Signed)
Exam and history consistent with gout flare. Start prednisone and colchicine. Check uric acid in 4-6 weeks after flare resolves. Will likely benefit from starting allopurinol.

## 2018-04-25 ENCOUNTER — Other Ambulatory Visit: Payer: Self-pay

## 2018-04-25 MED ORDER — COLCHICINE 0.6 MG PO TABS: 0.6000 mg | ORAL_TABLET | Freq: Every day | ORAL | 0 refills | Status: DC

## 2018-05-24 ENCOUNTER — Ambulatory Visit: Payer: Federal, State, Local not specified - PPO | Admitting: Family Medicine

## 2018-05-24 ENCOUNTER — Ambulatory Visit: Payer: Self-pay

## 2018-05-24 ENCOUNTER — Encounter: Payer: Self-pay | Admitting: Family Medicine

## 2018-05-24 VITALS — BP 124/86 | HR 94 | Temp 98.8°F | Ht 67.0 in | Wt 259.2 lb

## 2018-05-24 DIAGNOSIS — R0602 Shortness of breath: Secondary | ICD-10-CM

## 2018-05-24 DIAGNOSIS — R05 Cough: Secondary | ICD-10-CM | POA: Diagnosis not present

## 2018-05-24 DIAGNOSIS — J3489 Other specified disorders of nose and nasal sinuses: Secondary | ICD-10-CM

## 2018-05-24 DIAGNOSIS — J45901 Unspecified asthma with (acute) exacerbation: Secondary | ICD-10-CM

## 2018-05-24 DIAGNOSIS — R059 Cough, unspecified: Secondary | ICD-10-CM

## 2018-05-24 MED ORDER — ALBUTEROL SULFATE HFA 108 (90 BASE) MCG/ACT IN AERS: INHALATION_SPRAY | RESPIRATORY_TRACT | 1 refills | Status: DC

## 2018-05-24 MED ORDER — METHYLPREDNISOLONE ACETATE 80 MG/ML IJ SUSP
80.0000 mg | Freq: Once | INTRAMUSCULAR | Status: AC
  Administered 2018-05-24: 80 mg via INTRAMUSCULAR

## 2018-05-24 MED ORDER — BENZONATATE 200 MG PO CAPS: 200.0000 mg | ORAL_CAPSULE | Freq: Three times a day (TID) | ORAL | 0 refills | Status: DC | PRN

## 2018-05-24 MED ORDER — IPRATROPIUM BROMIDE 0.06 % NA SOLN: 2.0000 | Freq: Four times a day (QID) | NASAL | 0 refills | Status: DC

## 2018-05-24 MED ORDER — PREDNISONE 50 MG PO TABS: ORAL_TABLET | ORAL | 0 refills | Status: DC

## 2018-05-24 NOTE — Telephone Encounter (Signed)
Patient is coming this morning to see Dr. Jimmey RalphParker. He has seen message.

## 2018-05-24 NOTE — Patient Instructions (Addendum)
It was very nice to see you today!  You have an asthma flare.  We will give you an injection of a steroid called Depo-Medrol today.  Please start the prednisone tomorrow.  You can use your inhaler every 4-6 hours over the next couple of days.  Please use the cough medication as needed.  You can also use the nasal spray as needed.  If your symptoms worsen, or do not improve over the next couple of days, please let me know.  Please make sure to get plenty of rest and stay well-hydrated over the next couple of days.  Take care, Dr Jimmey RalphParker

## 2018-05-24 NOTE — Progress Notes (Signed)
   Subjective:  Gerald Palmer is a 42 y.o. male who presents today for same-day appointment with a chief complaint of cough.   HPI:  Cough, acute problem Symptoms started a couple days ago but have worsened over that time.  Patient has had associated wheeze, sputum production, nasal congestion and rhinorrhea, exertional dyspnea, and chills.  Symptoms are consistent with prior asthma flares.  Daughter has been sick with similar symptoms.  He has tried taking his nebulizer and albuterol inhaler at home with minimal relief.  Symptoms worse with climbing stairs.  No other obvious alleviating or aggravating factors.  ROS: Per HPI  PMH: He reports that he has quit smoking. He has never used smokeless tobacco. He reports that he drinks alcohol. He reports that he does not use drugs.  Objective:  Physical Exam: BP 124/86   Pulse 94   Temp 98.8 F (37.1 C) (Oral)   Ht 5\' 7"  (1.702 m)   Wt 259 lb 3.2 oz (117.6 kg)   SpO2 95%   BMI 40.60 kg/m   Gen: NAD, resting comfortably.  Speaking in full sentences. HEENT: TMs clear bilaterally. OP clear.  Nasal mucosa erythematous and boggy bilaterally with small amount of clear discharge. CV: RRR with no murmurs appreciated Pulm: NWOB, diffuse end expiratory wheeze in all lung fields.  Good airflow throughout.  Assessment/Plan:  Asthma exacerbation/rhinorrhea Patient with diffuse wheezes on his respiratory exam, however he is satting at 95% on room air and appears comfortable.  We will give him 80 mg IM of Depo-Medrol today.  Start prednisone burst tomorrow.  Advised him to use his albuterol inhaler every 4-6 hours over the next couple days.  We will also send in Tessalon for his cough and Atrovent for his rhinorrhea.  Encouraged him to stay well-hydrated.  Discussed reasons to return to care.  Consider adding LAMA to Regimen if has frequent exacerbations.   [name], MD 05/24/2018 8:25 AM

## 2018-05-24 NOTE — Telephone Encounter (Signed)
Pt. Reports he has had a cough "awhile" but has become worse the past two days. Has green mucus and wheezing. History of asthma. Uses an inhaler and nebulizer.Appointment made with his provider today.  Reason for Disposition . [1] Continuous (nonstop) coughing interferes with work or school AND [2] no improvement using cough treatment per Care Advice  Answer Assessment - Initial Assessment Questions 1. ONSET: "When did the cough begin?"      Got worse 2 days ago 2. SEVERITY: "How bad is the cough today?"      Moderate 3. RESPIRATORY DISTRESS: "Describe your breathing."      No distress 4. FEVER: "Do you have a fever?" If so, ask: "What is your temperature, how was it measured, and when did it start?"     No 5. SPUTUM: "Describe the color of your sputum" (clear, white, yellow, green)     Light green 6. HEMOPTYSIS: "Are you coughing up any blood?" If so ask: "How much?" (flecks, streaks, tablespoons, etc.)     No 7. CARDIAC HISTORY: "Do you have any history of heart disease?" (e.g., heart attack, congestive heart failure)      HTN 8. LUNG HISTORY: "Do you have any history of lung disease?"  (e.g., pulmonary embolus, asthma, emphysema)     Asthma 9. PE RISK FACTORS: "Do you have a history of blood clots?" (or: recent major surgery, recent prolonged travel, bedridden )     No 10. OTHER SYMPTOMS: "Do you have any other symptoms?" (e.g., runny nose, wheezing, chest pain)       Wheezing 11. PREGNANCY: "Is there any chance you are pregnant?" "When was your last menstrual period?"       N/A 12. TRAVEL: "Have you traveled out of the country in the last month?" (e.g., travel history, exposures)       No  Protocols used: COUGH - ACUTE PRODUCTIVE-A-AH

## 2018-05-29 ENCOUNTER — Ambulatory Visit: Payer: Self-pay | Admitting: *Deleted

## 2018-05-29 NOTE — Telephone Encounter (Signed)
Noted  

## 2018-05-29 NOTE — Telephone Encounter (Signed)
Pt called with continuing nasal and chest congestion. he states cough is worst because of more congestion. Very little wheezing.  Had appointment on last Wed with Dr. Jimmey RalphParker. Just finished his prednisone today. Encouraged to use an humidifier, neti pot,and prescribed medication. Denies fever.  Encourage to call back if his symptoms do not decrease in the next couple of days. Pt voiced understanding.  [facility] [place]. Reason for Disposition . Cough with cold symptoms (e.g., runny nose, postnasal drip, throat clearing)  Answer Assessment - Initial Assessment Questions 1. ONSET: "When did the cough begin?"      About a week 2. SEVERITY: "How bad is the cough today?"      Progressively worst 3. RESPIRATORY DISTRESS: "Describe your breathing."      Little labored per patient 4. FEVER: "Do you have a fever?" If so, ask: "What is your temperature, how was it measured, and when did it start?"     no 5. SPUTUM: "Describe the color of your sputum" (clear, white, yellow, green)     Greenish yellow 6. HEMOPTYSIS: "Are you coughing up any blood?" If so ask: "How much?" (flecks, streaks, tablespoons, etc.)     Streaks of blood when he blows his nose 7. CARDIAC HISTORY: "Do you have any history of heart disease?" (e.g., heart attack, congestive heart failure)      no 8. LUNG HISTORY: "Do you have any history of lung disease?"  (e.g., pulmonary embolus, asthma, emphysema)     asthma 9. PE RISK FACTORS: "Do you have a history of blood clots?" (or: recent major surgery, recent prolonged travel, bedridden )     no 10. OTHER SYMPTOMS: "Do you have any other symptoms?" (e.g., runny nose, wheezing, chest pain)       Wheezing just a little bit, chest tightness, runny 11. PREGNANCY: "Is there any chance you are pregnant?" "When was your last menstrual period?"       n/a 12. TRAVEL: "Have you traveled out of the country in the last month?" (e.g., travel history, exposures)  no  Protocols used: COUGH - ACUTE PRODUCTIVE-A-AH

## 2018-05-30 ENCOUNTER — Telehealth: Payer: Self-pay | Admitting: Family Medicine

## 2018-05-30 NOTE — Telephone Encounter (Signed)
Yes please. He will probably need a chest xray and we may need to change his inhalers.  [name], MD 05/30/2018 3:29 PM

## 2018-05-30 NOTE — Telephone Encounter (Signed)
Copied from CRM [id number]. Topic: Quick Communication - See Telephone Encounter >> May 30, 2018  3:02 PM Lorrine KinMcGee, [place] Torok B, NT wrote: CRM for notification. See Telephone encounter for: 05/30/18. Patient calling and states that he has not heard from anyone regarding his continuing nasal and chest congestion. Would like to know if Dr Jimmey RalphParker could send something to the pharmacy for this? CB#: [phone] [facility] [phone] - [place], [place] - [address] AT [facility] [address] [facility] [address]

## 2018-05-30 NOTE — Telephone Encounter (Signed)
Spoke with patient and scheduled with Dr. Earlene PlaterWallace on 05/31/2018 @ 3:20 pm.  Patient was unable to come sooner.

## 2018-05-30 NOTE — Telephone Encounter (Signed)
Please advise.  Would you like patient to come back in to be seen again?

## 2018-05-30 NOTE — Telephone Encounter (Signed)
See note

## 2018-05-31 ENCOUNTER — Encounter: Payer: Self-pay | Admitting: Family Medicine

## 2018-05-31 ENCOUNTER — Ambulatory Visit: Payer: Federal, State, Local not specified - PPO | Admitting: Family Medicine

## 2018-05-31 ENCOUNTER — Encounter: Payer: Self-pay | Admitting: Surgical

## 2018-05-31 VITALS — BP 132/84 | HR 91 | Temp 97.9°F | Ht 67.0 in | Wt 256.0 lb

## 2018-05-31 DIAGNOSIS — J4541 Moderate persistent asthma with (acute) exacerbation: Secondary | ICD-10-CM

## 2018-05-31 MED ORDER — CLINDAMYCIN HCL 300 MG PO CAPS: 300.0000 mg | ORAL_CAPSULE | Freq: Three times a day (TID) | ORAL | 0 refills | Status: DC

## 2018-05-31 MED ORDER — PREDNISONE 5 MG PO TABS: ORAL_TABLET | ORAL | 0 refills | Status: DC

## 2018-05-31 NOTE — Progress Notes (Signed)
Gerald Palmer is a 42 y.o. male here for an acute visit.  History of Present Illness:   HPI: Hx of mod persistent asthma. Recent exacerbation treated with steroids. Was improving, then worsened again. Cough deeper and purulent. New fever. Nonsmoker. No Hx of PNA or hospitalizations for asthma.   PMHx, SurgHx, SocialHx, Medications, and Allergies were reviewed in the Visit Navigator and updated as appropriate.  Current Medications:   Current Outpatient Medications:  .  albuterol (PROAIR HFA) 108 (90 Base) MCG/ACT inhaler, INHALE 2 PUFFS INTO THE LUNGS EVERY 4-6 HOURS AS NEEDED FOR COUGH OR WHEEZE, Disp: 8.5 g, Rfl: 1 .  benzonatate (TESSALON) 200 MG capsule, Take 1 capsule (200 mg total) by mouth 3 (three) times daily as needed for cough., Disp: 20 capsule, Rfl: 0 .  budesonide-formoterol (SYMBICORT) 160-4.5 MCG/ACT inhaler, Inhale 2 puffs into the lungs 2 (two) times daily., Disp: 1 Inhaler, Rfl: 5 .  colchicine 0.6 MG tablet, Take 1 tablet (0.6 mg total) by mouth daily., Disp: 30 tablet, Rfl: 0 .  fluticasone (FLONASE) 50 MCG/ACT nasal spray, 1 spray by Each Nare route daily. allergy, Disp: , Rfl:  .  fluticasone (FLOVENT HFA) 110 MCG/ACT inhaler, Inhale 2 puffs into the lungs 2 (two) times daily. (Patient not taking: Reported on 05/24/2018), Disp: 1 Inhaler, Rfl: 5 .  ipratropium (ATROVENT) 0.06 % nasal spray, Place 2 sprays into both nostrils 4 (four) times daily., Disp: 15 mL, Rfl: 0 .  ipratropium-albuterol (DUONEB) 0.5-2.5 (3) MG/3ML SOLN, INHALE THE CONTENTS OF ONE VIAL IN NEBULIZER EVERY 4 TO 6 HOURS AS NEEDED FOR COUGH OR WHEEZE, Disp: 540 mL, Rfl: 1 .  montelukast (SINGULAIR) 10 MG tablet, Take one tablet once daily, Disp: 30 tablet, Rfl: 5 .  predniSONE (DELTASONE) 50 MG tablet, Take 1 tablet daily for 5 days, Disp: 5 tablet, Rfl: 0   Allergies  Allergen Reactions  . Ibuprofen Hives    High doses cause hives  . Shellfish Allergy Itching    Eyes swell   Review of Systems:    Pertinent items are noted in the HPI. Otherwise, ROS is negative.  Vitals:   Vitals:   05/31/18 1510  BP: 132/84  Pulse: 91  Temp: 97.9 F (36.6 C)  TempSrc: Oral  SpO2: 96%  Weight: 256 lb (116.1 kg)  Height: 5\' 7"  (1.702 m)     Body mass index is 40.1 kg/m.  Physical Exam:   Physical Exam  Constitutional: He is oriented to person, place, and time. He appears well-developed and well-nourished. No distress.  HENT:  Head: Normocephalic and atraumatic.  Right Ear: External ear normal.  Left Ear: External ear normal.  Nose: Nose normal.  Mouth/Throat: Oropharynx is clear and moist.  Eyes: Pupils are equal, round, and reactive to light. Conjunctivae and EOM are normal.  Neck: Normal range of motion. Neck supple.  Cardiovascular: Normal rate, regular rhythm, normal heart sounds and intact distal pulses.  Pulmonary/Chest: Effort normal. He has wheezes. He has rhonchi.  Abdominal: Soft. Bowel sounds are normal.  Musculoskeletal: Normal range of motion.  Neurological: He is alert and oriented to person, place, and time.  Skin: Skin is warm and dry.  Psychiatric: He has a normal mood and affect. His behavior is normal. Judgment and thought content normal.  Nursing note and vitals reviewed.   Assessment and Plan:   Ether was seen today for cough.  Diagnoses and all orders for this visit:  Moderate persistent asthma with acute exacerbation -  clindamycin (CLEOCIN) 300 MG capsule; Take 1 capsule (300 mg total) by mouth 3 (three) times daily. -     predniSONE (DELTASONE) 5 MG tablet; Take 6,5,4,3,2,1 and done.    . Reviewed expectations re: course of current medical issues. . Discussed self-management of symptoms. . Outlined signs and symptoms indicating need for more acute intervention. . Patient verbalized understanding and all questions were answered. Marland Kitchen. Health Maintenance issues including appropriate healthy diet, exercise, and smoking avoidance were discussed  with patient. . See orders for this visit as documented in the electronic medical record. . Patient received an After Visit Summary.  Helane RimaErica Yukiko Minnich, DO [place] 06/04/2018

## 2018-06-04 ENCOUNTER — Encounter: Payer: Self-pay | Admitting: Family Medicine

## 2018-06-05 ENCOUNTER — Other Ambulatory Visit: Payer: Self-pay | Admitting: Family Medicine

## 2018-06-10 ENCOUNTER — Emergency Department (HOSPITAL_COMMUNITY)
Admission: EM | Admit: 2018-06-10 | Discharge: 2018-06-10 | Disposition: A | Discharge status: Home / Self Care | Payer: Federal, State, Local not specified - PPO | Attending: Emergency Medicine | Admitting: Emergency Medicine

## 2018-06-10 ENCOUNTER — Encounter (HOSPITAL_COMMUNITY): Payer: Self-pay

## 2018-06-10 ENCOUNTER — Emergency Department (HOSPITAL_COMMUNITY): Payer: Federal, State, Local not specified - PPO

## 2018-06-10 ENCOUNTER — Other Ambulatory Visit: Payer: Self-pay

## 2018-06-10 DIAGNOSIS — Y9384 Activity, sleeping: Secondary | ICD-10-CM | POA: Diagnosis not present

## 2018-06-10 DIAGNOSIS — Y92032 Bedroom in apartment as the place of occurrence of the external cause: Secondary | ICD-10-CM | POA: Diagnosis not present

## 2018-06-10 DIAGNOSIS — X501XXA Overexertion from prolonged static or awkward postures, initial encounter: Secondary | ICD-10-CM | POA: Insufficient documentation

## 2018-06-10 DIAGNOSIS — S39012A Strain of muscle, fascia and tendon of lower back, initial encounter: Secondary | ICD-10-CM | POA: Diagnosis not present

## 2018-06-10 DIAGNOSIS — R0789 Other chest pain: Secondary | ICD-10-CM | POA: Insufficient documentation

## 2018-06-10 DIAGNOSIS — Y999 Unspecified external cause status: Secondary | ICD-10-CM | POA: Insufficient documentation

## 2018-06-10 DIAGNOSIS — R079 Chest pain, unspecified: Secondary | ICD-10-CM | POA: Diagnosis not present

## 2018-06-10 LAB — CBC
HCT: 44.9 % (ref 39.0–52.0)
HEMOGLOBIN: 15.1 g/dL (ref 13.0–17.0)
MCH: 28.1 pg (ref 26.0–34.0)
MCHC: 33.6 g/dL (ref 30.0–36.0)
MCV: 83.6 fL (ref 78.0–100.0)
Platelets: 371 10*3/uL (ref 150–400)
RBC: 5.37 MIL/uL (ref 4.22–5.81)
RDW: 14.5 % (ref 11.5–15.5)
WBC: 10.7 10*3/uL — ABNORMAL HIGH (ref 4.0–10.5)

## 2018-06-10 LAB — BASIC METABOLIC PANEL
ANION GAP: 10 (ref 5–15)
BUN: 14 mg/dL (ref 6–20)
CHLORIDE: 106 mmol/L (ref 101–111)
CO2: 26 mmol/L (ref 22–32)
Calcium: 9 mg/dL (ref 8.9–10.3)
Creatinine, Ser: 1.48 mg/dL — ABNORMAL HIGH (ref 0.61–1.24)
GFR calc non Af Amer: 58 mL/min — ABNORMAL LOW (ref >60)
Glucose, Bld: 114 mg/dL — ABNORMAL HIGH (ref 65–99)
Potassium: 3.9 mmol/L (ref 3.5–5.1)
Sodium: 142 mmol/L (ref 135–145)

## 2018-06-10 LAB — I-STAT TROPONIN, ED: Troponin i, poc: 0 ng/mL (ref 0.00–0.08)

## 2018-06-10 MED ORDER — IOPAMIDOL (ISOVUE-370) INJECTION 76%
100.0000 mL | Freq: Once | INTRAVENOUS | Status: AC | PRN
  Administered 2018-06-10: 100 mL via INTRAVENOUS

## 2018-06-10 MED ORDER — IOPAMIDOL (ISOVUE-370) INJECTION 76%
INTRAVENOUS | Status: AC
  Filled 2018-06-10: qty 100

## 2018-06-10 MED ORDER — DIAZEPAM 5 MG PO TABS: 5.0000 mg | ORAL_TABLET | Freq: Two times a day (BID) | ORAL | 0 refills | Status: DC

## 2018-06-10 MED ORDER — ASPIRIN EC 325 MG PO TBEC
325.0000 mg | DELAYED_RELEASE_TABLET | Freq: Once | ORAL | Status: DC
  Filled 2018-06-10: qty 1

## 2018-06-10 MED ORDER — NITROGLYCERIN 0.4 MG SL SUBL
0.4000 mg | SUBLINGUAL_TABLET | SUBLINGUAL | Status: DC | PRN
  Administered 2018-06-10: 0.4 mg via SUBLINGUAL
  Filled 2018-06-10: qty 1

## 2018-06-10 NOTE — ED Triage Notes (Signed)
Patient c/o intermittent right chest pain that radiates into bilateral mid back pain x 2 days. Pain is worse with a deep breath.

## 2018-06-10 NOTE — ED Provider Notes (Signed)
[facility]-EMERGENCY DEPT Provider Note   CSN: [id number] Arrival date & time: 06/10/18  4098     History   Chief Complaint Chief Complaint  Patient presents with  . Chest Pain  . Back Pain    HPI Gerald Palmer is a 42 y.o. male.  Pt presents to the ED today with cp and back pain.  Back pain started first after lying on his daughter's floor (she was scared during a thunderstorm) to sleep.  The pain now radiates into his chest.  He said he feels like he can't get a deep breath.  The pt denies any n/v.  No f/c.  No cough.     Past Medical History:  Diagnosis Date  . Asthma   . Hypertension     Patient Active Problem List   Diagnosis Date Noted  . Acute gout 03/21/2018  . Knee pain 12/30/2017  . Asthma with acute exacerbation 11/14/2017  . Allergic rhinitis 11/14/2017  . Asthma 10/11/2017    Past Surgical History:  Procedure Laterality Date  . ACHILLES TENDON SURGERY    . ANKLE FRACTURE SURGERY Right   . TONSILLECTOMY          Home Medications    Prior to Admission medications   Medication Sig Start Date End Date Taking? Authorizing Provider  albuterol (PROAIR HFA) 108 (90 Base) MCG/ACT inhaler INHALE 2 PUFFS INTO THE LUNGS EVERY 4-6 HOURS AS NEEDED FOR COUGH OR WHEEZE 05/24/18  Yes Ardith Dark, MD  benzonatate (TESSALON) 200 MG capsule TAKE 1 CAPSULE BY MOUTH THREE TIMES DAILY AS NEEDED FOR COUGH 06/06/18  Yes Ardith Dark, MD  clindamycin (CLEOCIN) 300 MG capsule Take 1 capsule (300 mg total) by mouth 3 (three) times daily. 05/31/18  Yes Helane Rima, DO  colchicine 0.6 MG tablet Take 1 tablet (0.6 mg total) by mouth daily. Patient taking differently: Take 0.6 mg by mouth daily as needed (gout).  04/25/18  Yes Ardith Dark, MD  fluticasone [facility]) 50 MCG/ACT nasal spray 1 spray by Each Nare route daily. allergy 08/09/17  Yes [provider]  ipratropium-albuterol (DUONEB) 0.5-2.5 (3) MG/3ML SOLN INHALE THE CONTENTS OF  ONE VIAL IN NEBULIZER EVERY 4 TO 6 HOURS AS NEEDED FOR COUGH OR WHEEZE 11/14/17  Yes Bobbitt, Heywood Iles, MD  montelukast (SINGULAIR) 10 MG tablet Take one tablet once daily 10/11/17  Yes Ardith Dark, MD  budesonide-formoterol [facility]) 160-4.5 MCG/ACT inhaler Inhale 2 puffs into the lungs 2 (two) times daily. Patient taking differently: Inhale 2 puffs into the lungs 2 (two) times daily as needed (sob and wheezing).  11/14/17   Bobbitt, Heywood Iles, MD  diazepam (VALIUM) 5 MG tablet Take 1 tablet (5 mg total) by mouth 2 (two) times daily. 06/10/18   Jacalyn Lefevre, MD  fluticasone (FLOVENT HFA) 110 MCG/ACT inhaler Inhale 2 puffs into the lungs 2 (two) times daily. Patient not taking: Reported on 06/10/2018 11/14/17   Bobbitt, Heywood Iles, MD  ipratropium (ATROVENT) 0.06 % nasal spray Place 2 sprays into both nostrils 4 (four) times daily. Patient not taking: Reported on 06/10/2018 05/24/18   Ardith Dark, MD  predniSONE (DELTASONE) 5 MG tablet Take 6,5,4,3,2,1 and done. Patient not taking: Reported on 06/10/2018 05/31/18   Helane Rima, DO  predniSONE (DELTASONE) 50 MG tablet Take 1 tablet daily for 5 days Patient not taking: Reported on 06/10/2018 05/25/18   Ardith Dark, MD    Family History Family History  Problem Relation Age of  Onset  . Hypertension Mother   . Arthritis Mother   . Diabetes Mother   . Hyperlipidemia Mother   . Asthma Father   . Hyperlipidemia Father   . Hypertension Father   . Stroke Father   . Hypertension Brother   . Hyperlipidemia Brother   . Arthritis Maternal Grandmother     Social History Social History   Tobacco Use  . Smoking status: Former Games developermoker  . Smokeless tobacco: Never Used  Substance Use Topics  . Alcohol use: Yes    Comment: occasionally  . Drug use: No     Allergies   Aspirin; Ibuprofen; and Shellfish allergy   Review of Systems Review of Systems  Cardiovascular: Positive for chest pain.  Gastrointestinal: Positive for  abdominal pain.  All other systems reviewed and are negative.    Physical Exam Updated Vital Signs BP (!) 129/91 (BP Location: Right Wrist)   Pulse 86   Temp 98.3 F (36.8 C) (Oral)   Resp 18   Ht 5\' 7"  (1.702 m)   Wt 116.1 kg (256 lb)   SpO2 98%   BMI 40.10 kg/m   Physical Exam  Constitutional: He is oriented to person, place, and time. He appears well-developed and well-nourished.  HENT:  Head: Normocephalic and atraumatic.  Eyes: Pupils are equal, round, and reactive to light. EOM are normal.  Neck: Normal range of motion. Neck supple.  Cardiovascular: Normal rate, regular rhythm, intact distal pulses and normal pulses.  Pulmonary/Chest: Effort normal and breath sounds normal.  Abdominal: Soft. Bowel sounds are normal.  Musculoskeletal: Normal range of motion.       Right lower leg: Normal.       Left lower leg: Normal.  Upper back with some muscle spasm  Neurological: He is alert and oriented to person, place, and time.  Skin: Skin is warm and dry. Capillary refill takes less than 2 seconds.  Psychiatric: He has a normal mood and affect. His behavior is normal.  Nursing note and vitals reviewed.    ED Treatments / Results  Labs (all labs ordered are listed, but only abnormal results are displayed) Labs Reviewed  BASIC METABOLIC PANEL - Abnormal; Notable for the following components:      Result Value   Glucose, Bld 114 (*)    Creatinine, Ser 1.48 (*)    GFR calc non Af Amer 58 (*)    All other components within normal limits  CBC - Abnormal; Notable for the following components:   WBC 10.7 (*)    All other components within normal limits  I-STAT TROPONIN, ED    EKG EKG Interpretation  Date/Time:  Saturday June 10 2018 09:49:24 EDT Ventricular Rate:  95 PR Interval:    QRS Duration: 88 QT Interval:  338 QTC Calculation: 425 R Axis:   24 Text Interpretation:  Sinus rhythm Posterior infarct, old No significant change since last tracing Confirmed by  Jacalyn LefevreHaviland, Lurene Robley [phone]([id number]) on 06/10/2018 10:17:11 AM Also confirmed by Jacalyn LefevreHaviland, Lasundra Hascall [phone]), editor Madalyn RobEverhart, Marilyn [phone])  on 06/10/2018 11:46:05 AM   Radiology Dg Chest 2 View  Result Date: 06/10/2018 CLINICAL DATA:  Intermittent right chest pain that radiates to the mid back for 2 days. Pain is worse with a deep breath. EXAM: CHEST - 2 VIEW COMPARISON:  06/26/2010. FINDINGS: Mild elevation of the right hemidiaphragm. There is concern for a plate-like density at the medial right lung base and along the posterior right chest. Few streaky densities at the left lung base. Upper  lungs are clear. Heart and mediastinum are within normal limits. Trachea is midline. No large pleural effusions. Bone structures are unremarkable. IMPRESSION: Plate-like or band-like density at the right lung base with volume loss. Underlying infectious process cannot be excluded. Consider short-term follow-up to ensure resolution of this finding. Left basilar atelectasis. Electronically Signed   By: Richarda Overlie M.D.   On: 06/10/2018 11:04   Ct Angio Chest/abd/pel For Dissection W And/or Wo Contrast  Result Date: 06/10/2018 CLINICAL DATA:  intermittent right chest pain that radiates into bilateral mid back pain x 2 days. Pain is worse with a deep breath EXAM: CT ANGIOGRAPHY CHEST, ABDOMEN AND PELVIS TECHNIQUE: Multidetector CT imaging through the chest, abdomen and pelvis was performed using the standard protocol during bolus administration of intravenous contrast. Multiplanar reconstructed images and MIPs were obtained and reviewed to evaluate the vascular anatomy. CONTRAST:  ISOVUE-370 IOPAMIDOL (ISOVUE-370) INJECTION 76% COMPARISON:  Abdomen and pelvis CT, 11/11/2014. FINDINGS: CTA CHEST FINDINGS Cardiovascular: The thoracic aorta is normal in caliber. No dissection or atherosclerosis. The aortic branch vessels are widely patent. Heart is normal in size and configuration. No pericardial effusion. No coronary artery  calcifications. Mediastinum/Nodes: No enlarged mediastinal, hilar, or axillary lymph nodes. Thyroid gland, trachea, and esophagus demonstrate no significant findings. Lungs/Pleura: There is lung base opacity, most evident posterolateral, inferior right lower lobe, likely all due to atelectasis. Remainder of the lungs is clear. No pleural effusion. No pneumothorax. Review of the MIP images confirms the above findings. CTA ABDOMEN AND PELVIS FINDINGS VASCULAR Aorta: Normal caliber aorta without aneurysm, dissection, vasculitis or significant stenosis. Celiac: Patent without evidence of aneurysm, dissection, vasculitis or significant stenosis. SMA: Patent without evidence of aneurysm, dissection, vasculitis or significant stenosis. Renals: Both renal arteries are patent without evidence of aneurysm, dissection, vasculitis, fibromuscular dysplasia or significant stenosis. IMA: Patent without evidence of aneurysm, dissection, vasculitis or significant stenosis. Inflow: Patent without evidence of aneurysm, dissection, vasculitis or significant stenosis. Veins: No obvious venous abnormality within the limitations of this arterial phase study. Review of the MIP images confirms the above findings. NON-VASCULAR Hepatobiliary: No focal liver abnormality is seen. No gallstones, gallbladder wall thickening, or biliary dilatation. Pancreas: Unremarkable. No pancreatic ductal dilatation or surrounding inflammatory changes. Spleen: Normal in size without focal abnormality. Adrenals/Urinary Tract: No adrenal masses. Mild bilateral renal cortical thinning. Kidneys normal in size and position. Symmetric renal enhancement. 12 mm low-density lesion in the midpole the right kidney consistent with a cyst. 8 mm low-density cortical lesion in the midpole the left kidney, also consistent with a cyst. No other renal masses, no stones and no hydronephrosis. Normal ureters.  Normal bladder. Stomach/Bowel: Stomach is within normal limits.  Appendix appears normal. No evidence of bowel wall thickening, distention, or inflammatory changes. Lymphatic: No enlarged lymph nodes. Reproductive: Unremarkable. Other: No abdominal wall hernia or abnormality. No abdominopelvic ascites. MUSCULOSKELETAL FINDINGS No fracture. No bone lesion. There are minor disc degenerative changes reflected by endplate spurring along the mid to lower thoracic spine and lower lumbar spine. Review of the MIP images confirms the above findings. IMPRESSION: CTA FINDINGS 1. Negative exam. Normal thoracoabdominal aorta. Widely patent branch vessels with no atherosclerosis. CT CHEST FINDINGS 1. Dependent opacity in the lung bases most evident in the right lower lobe. This is most likely all atelectasis. Consider a component of pneumonia if there are consistent clinical findings. No other abnormality in the chest. CT ABDOMEN AND PELVIS FINDINGS 1. No acute findings in the abdomen or pelvis. 2. Mild bilateral  renal cortical thinning. Single small bilateral low-density renal masses consistent with cysts. Electronically Signed   By: Amie Portland M.D.   On: 06/10/2018 13:13    Procedures Procedures (including critical care time)  Medications Ordered in ED Medications  nitroGLYCERIN (NITROSTAT) SL tablet 0.4 mg (0.4 mg Sublingual Given 06/10/18 1057)  iopamidol (ISOVUE-370) 76 % injection (has no administration in time range)  iopamidol (ISOVUE-370) 76 % injection 100 mL (100 mLs Intravenous Contrast Given 06/10/18 1225)     Initial Impression / Assessment and Plan / ED Course  I have reviewed the triage vital signs and the nursing notes.  Pertinent labs & imaging results that were available during my care of the patient were reviewed by me and considered in my medical decision making (see chart for details).    Pt is feeling better.  BP is down.  Pt's sx very atypical for CAD.  They occurred after sleeping on a hard floor.  Pt is instructed to return if worse and to f/u  with pcp.  Final Clinical Impressions(s) / ED Diagnoses   Final diagnoses:  Atypical chest pain  Strain of lumbar region, initial encounter    ED Discharge Orders        Ordered    diazepam (VALIUM) 5 MG tablet  2 times daily     06/10/18 1324       Jacalyn Lefevre, MD 06/10/18 1325

## 2018-06-26 ENCOUNTER — Ambulatory Visit: Payer: Federal, State, Local not specified - PPO | Admitting: Family Medicine

## 2018-06-26 ENCOUNTER — Encounter: Payer: Self-pay | Admitting: Family Medicine

## 2018-06-26 VITALS — BP 130/74 | HR 86 | Temp 98.4°F | Ht 67.0 in | Wt 257.6 lb

## 2018-06-26 DIAGNOSIS — M67979 Unspecified disorder of synovium and tendon, unspecified ankle and foot: Secondary | ICD-10-CM | POA: Diagnosis not present

## 2018-06-26 DIAGNOSIS — M7989 Other specified soft tissue disorders: Secondary | ICD-10-CM | POA: Diagnosis not present

## 2018-06-26 DIAGNOSIS — M67879 Other specified disorders of synovium and tendon, unspecified ankle and foot: Secondary | ICD-10-CM

## 2018-06-26 MED ORDER — PREDNISONE 20 MG PO TABS: 20.0000 mg | ORAL_TABLET | Freq: Every day | ORAL | 0 refills | Status: DC

## 2018-06-26 NOTE — Progress Notes (Signed)
   Subjective:  Gerald Palmer is a 42 y.o. male who presents today for same-day appointment with a chief complaint of swollen ankle.   HPI:  Leg Swelling, acute problem Started about a week ago. Located on the lack of his left leg, just over the site of his prior Achilles tendon repair. No obvious precipitating events. Area is mildly painful. He is able to walk normally. No obvious precipitating events. No specific treatments tried.   ROS: Per HPI  PMH: He reports that he has quit smoking. He has never used smokeless tobacco. He reports that he drinks alcohol. He reports that he does not use drugs.  Objective:  Physical Exam: BP 130/74 (BP Location: Right Arm, Patient Position: Sitting, Cuff Size: Large)   Pulse 86   Temp 98.4 F (36.9 C) (Oral)   Ht 5\' 7"  (1.702 m)   Wt 257 lb 9.6 oz (116.8 kg)   SpO2 96%   BMI 40.35 kg/m   Gen: NAD, resting comfortably MSK: -LLE: 3cm mass on posterior aspect of left achilles. Mildly tender to palpation. Minimal plantarflexion with thompson test.  Plantar flexion strength 5 out of 5. -RLE: No deformities.  Plantarflexion with Thompson squeeze test.  Plantarflexion strength 5 out of 5.  Assessment/Plan:  Leg swelling/Achilles Tendon mass Concern for partial tear vs scar tissue inflammation from prior surgery. He has no signs of a complete tear to stable currently.  We will start prednisone today to help with any underlying inflammation -he is not a candidate for NSAIDs due to ibuprofen allergy.  He will follow-up with Dr. Berline Palmer tomorrow for further evaluation management.  Gerald [name] M. Jimmey RalphParker, MD 06/26/2018 4:45 PM

## 2018-06-26 NOTE — Patient Instructions (Signed)
It was very nice to see you today!  I think you have some inflammation in your achilles tendon.  This could be due to scar tissue or a partial tear.  Please follow-up with Dr. Berline Choughigby tomorrow to have an ultrasound done.  We will start prednisone to help with the swelling today.  Take care, Dr Jimmey RalphParker

## 2018-06-27 ENCOUNTER — Ambulatory Visit: Payer: Federal, State, Local not specified - PPO | Admitting: Sports Medicine

## 2018-06-27 ENCOUNTER — Ambulatory Visit: Payer: Federal, State, Local not specified - PPO | Admitting: Family Medicine

## 2018-06-28 ENCOUNTER — Encounter: Payer: Self-pay | Admitting: Sports Medicine

## 2018-07-06 ENCOUNTER — Encounter: Payer: Self-pay | Admitting: Sports Medicine

## 2018-07-06 ENCOUNTER — Ambulatory Visit: Payer: Self-pay

## 2018-07-06 ENCOUNTER — Ambulatory Visit: Payer: Federal, State, Local not specified - PPO | Admitting: Sports Medicine

## 2018-07-06 VITALS — BP 138/92 | HR 98 | Ht 67.0 in | Wt 253.8 lb

## 2018-07-06 DIAGNOSIS — M25561 Pain in right knee: Secondary | ICD-10-CM

## 2018-07-06 DIAGNOSIS — M1000 Idiopathic gout, unspecified site: Secondary | ICD-10-CM | POA: Diagnosis not present

## 2018-07-06 DIAGNOSIS — M67879 Other specified disorders of synovium and tendon, unspecified ankle and foot: Secondary | ICD-10-CM

## 2018-07-06 DIAGNOSIS — M10061 Idiopathic gout, right knee: Secondary | ICD-10-CM

## 2018-07-06 DIAGNOSIS — M67979 Unspecified disorder of synovium and tendon, unspecified ankle and foot: Secondary | ICD-10-CM | POA: Diagnosis not present

## 2018-07-06 DIAGNOSIS — M25461 Effusion, right knee: Secondary | ICD-10-CM | POA: Diagnosis not present

## 2018-07-06 LAB — URIC ACID: Uric Acid, Serum: 10.8 mg/dL — ABNORMAL HIGH (ref 4.0–7.8)

## 2018-07-06 NOTE — Procedures (Signed)
PROCEDURE NOTE:  Ultrasound Guided: Aspiration and Injection: Right knee Images were obtained and interpreted by myself, Gaspar BiddingMichael Zariah Jost, DO  Images have been saved and stored to PACS system. Images obtained on: GE S7 Ultrasound machine    ULTRASOUND FINDINGS:  large effusion  DESCRIPTION OF PROCEDURE:  The patient's clinical condition is marked by substantial pain and/or significant functional disability. Other conservative therapy has not provided relief, is contraindicated, or not appropriate. There is a reasonable likelihood that injection will significantly improve the patient's pain and/or functional impairment.   After discussing the risks, benefits and expected outcomes of the injection and all questions were reviewed and answered, the patient wished to undergo the above named procedure.  Verbal consent was obtained.  The ultrasound was used to identify the target structure and adjacent neurovascular structures. The skin was then prepped in sterile fashion and the target structure was injected under direct visualization using sterile technique as below:  Single injection performed as below: PREP: Alcohol, Ethel Chloride and 5 cc 1% lidocaine on 25g 1.5 in. needle APPROACH:superiolateral, stopcock technique, 18g 1.5 in. INJECTATE: 2 cc 0.5% Marcaine and 2 cc 40mg /mL DepoMedrol ASPIRATE: 42cc serous fluid, clear and yellow DRESSING: Band-Aid and 6-inch Ace Wrap  Post procedural instructions including recommending icing and warning signs for infection were reviewed.    This procedure was well tolerated and there were no complications.   IMPRESSION: Succesful Ultrasound Guided: Aspiration and Injection

## 2018-07-06 NOTE — Patient Instructions (Signed)

## 2018-07-06 NOTE — Progress Notes (Signed)
Gerald Palmer. [facility] at [facility] [phone]  Gerald Palmer - 42 y.o. male MRN [id number]  Date of birth: November 10, 1976  Visit Date: 07/06/2018  PCP: Ardith Dark, MD   Referred by: Ardith Dark, MD  Scribe(s) for today's visit: Stevenson Clinch, CMA  SUBJECTIVE:  Gerald Palmer is here for Follow-up (R knee pain and swelling) and L achilles (mass on posterior L ankle)   01/11/2018: Compared to the last office visit, his previously described symptoms of RT knee pain and swelling are: are worsening, he started doing a few miles on the treadmill last Thursday or Friday. He has been going well since the injection but when he woke yesterday he noticed sharp shooting pains in the knee. This morning when he woke up his RT hip and knee were both hurting. There is swelling around the knee. The lateral aspect of the knee is now tender to palpation.  Current symptoms are severe & are non-radiating. RT hip pain started today within the past few hours.  He had steroid injection and 100 CC fluid aspirated 12/30/17. Synovial fluid came back negative for crystals. He was prescribed Colchicine and advised to wear ACE wrap. Pt ambulating with crutches today. He has tried taking Aleve with some relief.  Pt denies erythema, increased warmth or drainage at the injection site.   07/06/2018: Compared to the last office visit, his previously described symptoms are worsening. He walked on the Treadmill Sunday and Tuesday morning his knee started "feeling funny" and swelling. The pain and swelling is causing him to limp. He reports that sx are similar to last time and pain and swelling seems to be in the same spot.  Current symptoms are moderate (6/10) & are nonradiating He has been icing the knee with minimal relief. He has not taken any OTC meds for pain. He has a Body Helix compression sleeve but he has not used it during this flare up.   He c/o pain in L  achilles. Pain comes and goes. He had surgery on his acilles about 6 years ago. Over the past month he has notices a mass/knot the proximal end of the incision scar. Initially it was tender to palpation but that has resolved. He denies drainage, increased warmth or erythema. Initially the area was very soft but now it is hardening. This does not cause difficulty with walking.    REVIEW OF SYSTEMS: Denies night time disturbances. Denies fevers, chills, or night sweats. Denies unexplained weight loss. Denies personal history of cancer. Denies changes in bowel or bladder habits. Denies recent unreported falls. Denies new or worsening dyspnea or wheezing. Denies headaches or dizziness.  Denies numbness, tingling or weakness  In the extremities.  Denies dizziness or presyncopal episodes Reports lower extremity edema    HISTORY & PERTINENT PRIOR DATA:  Prior History reviewed and updated per electronic medical record.  Significant/pertinent history, findings, studies include:  reports that he has quit smoking. He has never used smokeless tobacco. No results for input(s): HGBA1C, LABURIC, CREATINE in the last 8760 hours. No specialty comments available. No problems updated.  OBJECTIVE:  VS:  HT:5\' 7"  (170.2 cm)   WT:253 lb 12.8 oz (115.1 kg)  BMI:39.74    BP:(Abnormal) 138/92  HR:98bpm  TEMP: ( )  RESP:96 %   PHYSICAL EXAM: Constitutional: WDWN, Non-toxic appearing. Psychiatric: Alert & appropriately interactive.  Not depressed or anxious appearing. Respiratory: No increased work of breathing.  Trachea Midline Eyes: Pupils  are equal.  EOM intact without nystagmus.  No scleral icterus  Vascular Exam: warm to touch no edema  lower extremity neuro exam: unremarkable  MSK Exam: Left Achilles has a well-healed postsurgical incision with a nodule at the superior incision/surgical site.  This is nontender but there is a small amount of blanching erythema over the skin directly.  It  is approximately 2 x 2 cm.  Right knee has a large effusion.  Mild synovitis.  Pain with flexion and extension.  Ligamentously stable otherwise.  Extensor mechanism intact.   ASSESSMENT & PLAN:   1. Effusion of right knee   2. Acute pain of right knee   3. Acute idiopathic gout, unspecified site   4. Achilles tendon mass   5. Acute idiopathic gout of right knee     PLAN: Aspiration injection in the knee again today.  We will send for cell count and crystal analysis again given that he has had multiple flareups of where clinically consistent with gout.  We will check uric acid level today but did discuss that even if this is normal we will need to recheck his uric acid levels again in 6 weeks for the potential of false normal values during an acute flareup.  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  Could consider empiric allopurinol.  He has a prescription for colchicine and recommend he try this for the next 2 days to see if any benefit.  MRI results previously obtained earlier this year were reviewed  Follow-up: Return in about 6 weeks (around 08/17/2018).      Please see additional documentation for Objective, Assessment and Plan sections. Pertinent additional documentation may be included in corresponding procedure notes, imaging studies, problem based documentation and patient instructions. Please see these sections of the encounter for additional information regarding this visit.  CMA/ATC served as Neurosurgeonscribe during this visit. History, Physical, and Plan performed by medical provider. Documentation and orders reviewed and attested to.      Gerald MewsMichael D Rigby, DO    [facility]

## 2018-07-07 ENCOUNTER — Other Ambulatory Visit: Payer: Self-pay

## 2018-07-07 ENCOUNTER — Telehealth: Payer: Self-pay | Admitting: Family Medicine

## 2018-07-07 LAB — SYNOVIAL CELL COUNT + DIFF, W/ CRYSTALS
Basophils, %: 0 %
EOSINOPHILS-SYNOVIAL: 0 % (ref 0–2)
Lymphocytes-Synovial Fld: 13 % (ref 0–74)
Monocyte/Macrophage: 23 % (ref 0–69)
NEUTROPHIL, SYNOVIAL: 62 % — AB (ref 0–24)
Synoviocytes, %: 0 % (ref 0–15)
WBC, SYNOVIAL: 404 {cells}/uL — AB (ref <150)

## 2018-07-07 MED ORDER — COLCHICINE 0.6 MG PO TABS: 0.6000 mg | ORAL_TABLET | Freq: Every day | ORAL | 5 refills | Status: DC | PRN

## 2018-07-07 MED ORDER — ALLOPURINOL 300 MG PO TABS: 300.0000 mg | ORAL_TABLET | Freq: Every day | ORAL | 5 refills | Status: DC

## 2018-07-07 NOTE — Telephone Encounter (Signed)
Dr. Berline Choughigby had these labs performed.

## 2018-07-07 NOTE — Progress Notes (Signed)
Arthrocentesis is positive for uric acid crystals consistent.  His uric acid levels are also elevated and his blood.  Please see other note for further management.

## 2018-07-07 NOTE — Telephone Encounter (Signed)
See note.   Copied from CRM [phone]. Topic: Quick Communication - Lab Results >> Jul 07, 2018 12:10 PM Alexander BergeronBarksdale, Gerald Palmer for [facility] tri[facility] to give labs

## 2018-07-07 NOTE — Progress Notes (Signed)
Markedly elevated uric acid levels.  We need to start him on allopurinol 300 mg once daily as well as colchicine.  Have a follow-up in 6 weeks for recheck of blood work.

## 2018-07-12 ENCOUNTER — Telehealth: Payer: Self-pay | Admitting: Family Medicine

## 2018-07-12 NOTE — Telephone Encounter (Signed)
Copied from CRM [phone]. Topic: Quick Communication - See Telephone Encounter >> Jul 12, 2018  4:20 PM Raquel SarnaHayes, Teresa G wrote: [facility]  Fax # [phone] Needing all info on pt's knee faxed over to treat pt.

## 2018-07-13 DIAGNOSIS — M25561 Pain in right knee: Secondary | ICD-10-CM | POA: Diagnosis not present

## 2018-07-13 DIAGNOSIS — M25461 Effusion, right knee: Secondary | ICD-10-CM | POA: Diagnosis not present

## 2018-07-13 NOTE — Telephone Encounter (Signed)
Office notes and Ultrasound report [facility].

## 2018-08-07 ENCOUNTER — Encounter: Payer: Self-pay | Admitting: Family Medicine

## 2018-08-07 ENCOUNTER — Ambulatory Visit (INDEPENDENT_AMBULATORY_CARE_PROVIDER_SITE_OTHER): Payer: Federal, State, Local not specified - PPO | Admitting: Family Medicine

## 2018-08-07 ENCOUNTER — Encounter (INDEPENDENT_AMBULATORY_CARE_PROVIDER_SITE_OTHER): Payer: Self-pay

## 2018-08-07 ENCOUNTER — Other Ambulatory Visit: Payer: Self-pay | Admitting: Family Medicine

## 2018-08-07 VITALS — BP 124/78 | HR 85 | Temp 98.5°F | Ht 67.0 in | Wt 253.6 lb

## 2018-08-07 DIAGNOSIS — Z0001 Encounter for general adult medical examination with abnormal findings: Secondary | ICD-10-CM | POA: Diagnosis not present

## 2018-08-07 DIAGNOSIS — Z13220 Encounter for screening for lipoid disorders: Secondary | ICD-10-CM

## 2018-08-07 DIAGNOSIS — F320 Major depressive disorder, single episode, mild: Secondary | ICD-10-CM | POA: Insufficient documentation

## 2018-08-07 DIAGNOSIS — R739 Hyperglycemia, unspecified: Secondary | ICD-10-CM | POA: Diagnosis not present

## 2018-08-07 DIAGNOSIS — G4730 Sleep apnea, unspecified: Secondary | ICD-10-CM

## 2018-08-07 DIAGNOSIS — J4540 Moderate persistent asthma, uncomplicated: Secondary | ICD-10-CM

## 2018-08-07 LAB — LIPID PANEL
CHOL/HDL RATIO: 6
Cholesterol: 185 mg/dL (ref 0–200)
HDL: 30.9 mg/dL — ABNORMAL LOW (ref >39.00)
LDL CALC: 129 mg/dL — AB (ref 0–99)
NONHDL: 154.45
Triglycerides: 126 mg/dL (ref 0.0–149.0)
VLDL: 25.2 mg/dL (ref 0.0–40.0)

## 2018-08-07 LAB — CBC
HCT: 44.7 % (ref 39.0–52.0)
Hemoglobin: 14.5 g/dL (ref 13.0–17.0)
MCHC: 32.5 g/dL (ref 30.0–36.0)
MCV: 83.3 fl (ref 78.0–100.0)
Platelets: 410 10*3/uL — ABNORMAL HIGH (ref 150.0–400.0)
RBC: 5.37 Mil/uL (ref 4.22–5.81)
RDW: 15.1 % (ref 11.5–15.5)
WBC: 5.5 10*3/uL (ref 4.0–10.5)

## 2018-08-07 LAB — COMPREHENSIVE METABOLIC PANEL
ALT: 22 U/L (ref 0–53)
AST: 18 U/L (ref 0–37)
Albumin: 4.2 g/dL (ref 3.5–5.2)
Alkaline Phosphatase: 82 U/L (ref 39–117)
BILIRUBIN TOTAL: 0.6 mg/dL (ref 0.2–1.2)
BUN: 13 mg/dL (ref 6–23)
CHLORIDE: 105 meq/L (ref 96–112)
CO2: 29 meq/L (ref 19–32)
CREATININE: 1.59 mg/dL — AB (ref 0.40–1.50)
Calcium: 9.7 mg/dL (ref 8.4–10.5)
GFR: 62.2 mL/min (ref >60.00)
Glucose, Bld: 93 mg/dL (ref 70–99)
Potassium: 4.7 mEq/L (ref 3.5–5.1)
SODIUM: 141 meq/L (ref 135–145)
Total Protein: 6.7 g/dL (ref 6.0–8.3)

## 2018-08-07 LAB — TSH: TSH: 1.27 u[IU]/mL (ref 0.35–4.50)

## 2018-08-07 LAB — HEMOGLOBIN A1C: Hgb A1c MFr Bld: 6.6 % — ABNORMAL HIGH (ref 4.6–6.5)

## 2018-08-07 MED ORDER — ALBUTEROL SULFATE HFA 108 (90 BASE) MCG/ACT IN AERS: INHALATION_SPRAY | RESPIRATORY_TRACT | 1 refills | Status: DC

## 2018-08-07 MED ORDER — ESCITALOPRAM OXALATE 10 MG PO TABS: 10.0000 mg | ORAL_TABLET | Freq: Every day | ORAL | 5 refills | Status: DC

## 2018-08-07 NOTE — Assessment & Plan Note (Addendum)
Start Lexapro 10 mg daily.  He will also be following up with a therapist.  Follow-up with me in 4 to 6 weeks.  Check CBC, CMET, and TSH.

## 2018-08-07 NOTE — Patient Instructions (Signed)
It was very nice to see you today!  Please start the lexapro.  We will check blood work today and set you up to have a sleep study.  Please schedule an appointment with Gerald Palmer soon.   Take care, Dr Jerline Pain   Preventive Care 40-64 Years, Male Preventive care refers to lifestyle choices and visits with your health care provider that can promote health and wellness. What does preventive care include?  A yearly physical exam. This is also called an annual well check.  Dental exams once or twice a year.  Routine eye exams. Ask your health care provider how often you should have your eyes checked.  Personal lifestyle choices, including: ? Daily care of your teeth and gums. ? Regular physical activity. ? Eating a healthy diet. ? Avoiding tobacco and drug use. ? Limiting alcohol use. ? Practicing safe sex. ? Taking low-dose aspirin every day starting at age 91. What happens during an annual well check? The services and screenings done by your health care provider during your annual well check will depend on your age, overall health, lifestyle risk factors, and family history of disease. Counseling Your health care provider may ask you questions about your:  Alcohol use.  Tobacco use.  Drug use.  Emotional well-being.  Home and relationship well-being.  Sexual activity.  Eating habits.  Work and work Statistician.  Screening You may have the following tests or measurements:  Height, weight, and BMI.  Blood pressure.  Lipid and cholesterol levels. These may be checked every 5 years, or more frequently if you are over 79 years old.  Skin check.  Lung cancer screening. You may have this screening every year starting at age 33 if you have a 30-pack-year history of smoking and currently smoke or have quit within the past 15 years.  Fecal occult blood test (FOBT) of the stool. You may have this test every year starting at age 77.  Flexible sigmoidoscopy or colonoscopy.  You may have a sigmoidoscopy every 5 years or a colonoscopy every 10 years starting at age 34.  Prostate cancer screening. Recommendations will vary depending on your family history and other risks.  Hepatitis C blood test.  Hepatitis B blood test.  Sexually transmitted disease (STD) testing.  Diabetes screening. This is done by checking your blood sugar (glucose) after you have not eaten for a while (fasting). You may have this done every 1-3 years.  Discuss your test results, treatment options, and if necessary, the need for more tests with your health care provider. Vaccines Your health care provider may recommend certain vaccines, such as:  Influenza vaccine. This is recommended every year.  Tetanus, diphtheria, and acellular pertussis (Tdap, Td) vaccine. You may need a Td booster every 10 years.  Varicella vaccine. You may need this if you have not been vaccinated.  Zoster vaccine. You may need this after age 5.  Measles, mumps, and rubella (MMR) vaccine. You may need at least one dose of MMR if you were born in 1957 or later. You may also need a second dose.  Pneumococcal 13-valent conjugate (PCV13) vaccine. You may need this if you have certain conditions and have not been vaccinated.  Pneumococcal polysaccharide (PPSV23) vaccine. You may need one or two doses if you smoke cigarettes or if you have certain conditions.  Meningococcal vaccine. You may need this if you have certain conditions.  Hepatitis A vaccine. You may need this if you have certain conditions or if you travel or work in  places where you may be exposed to hepatitis A.  Hepatitis B vaccine. You may need this if you have certain conditions or if you travel or work in places where you may be exposed to hepatitis B.  Haemophilus influenzae type b (Hib) vaccine. You may need this if you have certain risk factors.  Talk to your health care provider about which screenings and vaccines you need and how often you  need them. This information is not intended to replace advice given to you by your health care provider. Make sure you discuss any questions you have with your health care provider. Document Released: 01/02/2016 Document Revised: 08/25/2016 Document Reviewed: 10/07/2015 Elsevier Interactive Patient Education  [employer].

## 2018-08-07 NOTE — Assessment & Plan Note (Signed)
Stable Refilled albuterol  

## 2018-08-07 NOTE — Progress Notes (Signed)
Subjective:  Gerald Palmer is a 42 y.o. male who presents today for his annual comprehensive physical exam.    HPI:  Sleep Disordered Breathing Patient reports having several episodes of witnessed apnea at night.  Diffuse sleep studies in all been "borderline".  He would like to have a repeat sleep study done.  Depressed mood Patient with several history of intermittently depressed mood.  Symptoms of worsened over the last several weeks to months.  No clear precipitating events.  He has been on medication in the past, but does not remember the name.  Is also seeing a therapist in the past.  No specific treatments tried.  Lifestyle Diet: No specific diets.  Exercise: Treadmill at gym regularly. Limited due to knee pain. 3-4 times per week.   Depression screen PHQ 2/9 10/04/2017  Decreased Interest 0  Down, Depressed, Hopeless 0  PHQ - 2 Score 0   Health Maintenance Due  Topic Date Due  . TETANUS/TDAP  03/16/1997  . INFLUENZA VACCINE  07/20/2018     ROS: Per HPI, otherwise a complete review of systems was negative.   PMH:  The following were reviewed and entered/updated in epic: Past Medical History:  Diagnosis Date  . Asthma   . Hypertension    Patient Active Problem List   Diagnosis Date Noted  . Depression, major, single episode, mild (HCC) 08/07/2018  . Acute gout 03/21/2018  . Knee pain 12/30/2017  . Allergic rhinitis 11/14/2017  . Asthma 10/11/2017   Past Surgical History:  Procedure Laterality Date  . ACHILLES TENDON SURGERY    . ANKLE FRACTURE SURGERY Right   . TONSILLECTOMY      Family History  Problem Relation Age of Onset  . Hypertension Mother   . Arthritis Mother   . Diabetes Mother   . Hyperlipidemia Mother   . Asthma Father   . Hyperlipidemia Father   . Hypertension Father   . Stroke Father   . Hypertension Brother   . Hyperlipidemia Brother   . Arthritis Maternal Grandmother     Medications- reviewed and updated Current Outpatient  Medications  Medication Sig Dispense Refill  . albuterol (PROAIR HFA) 108 (90 Base) MCG/ACT inhaler INHALE 2 PUFFS INTO THE LUNGS EVERY 4-6 HOURS AS NEEDED FOR COUGH OR WHEEZE 8.5 g 1  . allopurinol (ZYLOPRIM) 300 MG tablet Take 1 tablet (300 mg total) by mouth daily. 30 tablet 5  . budesonide-formoterol (SYMBICORT) 160-4.5 MCG/ACT inhaler Inhale 2 puffs into the lungs 2 (two) times daily. (Patient taking differently: Inhale 2 puffs into the lungs 2 (two) times daily as needed (sob and wheezing). ) 1 Inhaler 5  . colchicine 0.6 MG tablet Take 1 tablet (0.6 mg total) by mouth daily as needed (gout). 30 tablet 5  . fluticasone (FLONASE) 50 MCG/ACT nasal spray 1 spray by Each Nare route daily. allergy    . ipratropium-albuterol (DUONEB) 0.5-2.5 (3) MG/3ML SOLN INHALE THE CONTENTS OF ONE VIAL IN NEBULIZER EVERY 4 TO 6 HOURS AS NEEDED FOR COUGH OR WHEEZE 540 mL 1  . montelukast (SINGULAIR) 10 MG tablet Take one tablet once daily 30 tablet 5  . escitalopram (LEXAPRO) 10 MG tablet Take 1 tablet (10 mg total) by mouth daily. 30 tablet 5   No current facility-administered medications for this visit.    Allergies-reviewed and updated Allergies  Allergen Reactions  . Aspirin     unknown  . Ibuprofen Hives    High doses cause hives  . Nsaids Hives  . Shellfish  Allergy Itching    Eyes swell    Social History   Socioeconomic History  . Marital status: Single    Spouse name: Not on file  . Number of children: Not on file  . Years of education: Not on file  . Highest education level: Not on file  Occupational History  . Not on file  Social Needs  . Financial resource strain: Not on file  . Food insecurity:    Worry: Not on file    Inability: Not on file  . Transportation needs:    Medical: Not on file    Non-medical: Not on file  Tobacco Use  . Smoking status: Former Games developer  . Smokeless tobacco: Never Used  Substance and Sexual Activity  . Alcohol use: Yes    Comment: occasionally  .  Drug use: No  . Sexual activity: Yes  Lifestyle  . Physical activity:    Days per week: Not on file    Minutes per session: Not on file  . Stress: Not on file  Relationships  . Social connections:    Talks on phone: Not on file    Gets together: Not on file    Attends religious service: Not on file    Active member of club or organization: Not on file    Attends meetings of clubs or organizations: Not on file    Relationship status: Not on file  Other Topics Concern  . Not on file  Social History Narrative  . Not on file    Objective:  Physical Exam: BP 124/78 (BP Location: Left Arm, Patient Position: Sitting, Cuff Size: Large)   Pulse 85   Temp 98.5 F (36.9 C) (Oral)   Ht 5\' 7"  (1.702 m)   Wt 253 lb 9.6 oz (115 kg)   SpO2 97%   BMI 39.72 kg/m   Body mass index is 39.72 kg/m. Wt Readings from Last 3 Encounters:  08/07/18 253 lb 9.6 oz (115 kg)  07/06/18 253 lb 12.8 oz (115.1 kg)  06/26/18 257 lb 9.6 oz (116.8 kg)   Gen: NAD, resting comfortably HEENT: TMs normal bilaterally. OP clear. No thyromegaly noted.  CV: RRR with no murmurs appreciated Pulm: NWOB, CTAB with no crackles, wheezes, or rhonchi GI: Normal bowel sounds present. Soft, Nontender, Nondistended. MSK: no edema, cyanosis, or clubbing noted Skin: warm, dry Neuro: CN2-12 grossly intact. Strength 5/5 in upper and lower extremities. Reflexes symmetric and intact bilaterally.  Psych: Normal affect and thought content  Assessment/Plan:  Depression, major, single episode, mild (HCC) Start Lexapro 10 mg daily.  He will also be following up with a therapist.  Follow-up with me in 4 to 6 weeks.  Check CBC, CMET, and TSH.  Asthma Stable.  Refilled albuterol.  Sleep disordered breathing Please referral for sleep study.  Hyperglycemia Check A1c.  Preventative Healthcare: Check lipid panel.  Patient Counseling(The following topics were reviewed and/or handout was given):  -Nutrition: Stressed  importance of moderation in sodium/caffeine intake, saturated fat and cholesterol, caloric balance, sufficient intake of fresh fruits, vegetables, and fiber.  -Stressed the importance of regular exercise.   -Substance Abuse: Discussed cessation/primary prevention of tobacco, alcohol, or other drug use; driving or other dangerous activities under the influence; availability of treatment for abuse.   -Injury prevention: Discussed safety belts, safety helmets, smoke detector, smoking near bedding or upholstery.   -Sexuality: Discussed sexually transmitted diseases, partner selection, use of condoms, avoidance of unintended pregnancy and contraceptive alternatives.   -Dental health: Discussed  importance of regular tooth brushing, flossing, and dental visits.  -Health maintenance and immunizations reviewed. Please refer to Health maintenance section.  Return to care in 1 year for next preventative visit.   [name], MD 08/07/2018 2:36 PM

## 2018-08-08 ENCOUNTER — Encounter: Payer: Self-pay | Admitting: Family Medicine

## 2018-08-08 DIAGNOSIS — R739 Hyperglycemia, unspecified: Secondary | ICD-10-CM | POA: Insufficient documentation

## 2018-08-08 DIAGNOSIS — E785 Hyperlipidemia, unspecified: Secondary | ICD-10-CM | POA: Insufficient documentation

## 2018-08-08 NOTE — Progress Notes (Signed)
Please inform patient of the following:  Blood counts are normal.  Thyroid level is normal Electrolytes, kidney function, and liver function are normal. His blood sugar level is high and his cholesterol level is high. He would benefit from starting metformin to lower his blood sugar numbers and reduce his risk of heart attack and stroke. Please send in the XR form 750mg  daily if he is willing to start it. He would also benefit from starting lipitor 40mg  daily to lower his cholesterol and prevent heart attack and stroke. Please send this in.  I would like for him to come back in 3 months to recheck his A1c.  [name], MD 08/08/2018 7:54 AM

## 2018-08-17 ENCOUNTER — Ambulatory Visit: Payer: Federal, State, Local not specified - PPO | Admitting: Sports Medicine

## 2018-08-17 ENCOUNTER — Telehealth: Payer: Self-pay

## 2018-08-17 NOTE — Telephone Encounter (Signed)
Patient dropped off FMLA forms to be completed. Patient last seen 10-2017 and was due 02-2018 for a follow up. Patient must be seen in office prior to completion of forms. I did attempt to contact patient but his voicemail was full. Forms being placed back in box until seen in office.

## 2018-09-05 ENCOUNTER — Ambulatory Visit: Payer: Federal, State, Local not specified - PPO | Admitting: Allergy and Immunology

## 2018-09-05 ENCOUNTER — Other Ambulatory Visit: Payer: Self-pay | Admitting: Allergy and Immunology

## 2018-09-05 ENCOUNTER — Encounter: Payer: Self-pay | Admitting: Allergy and Immunology

## 2018-09-05 ENCOUNTER — Telehealth: Payer: Self-pay | Admitting: Family Medicine

## 2018-09-05 VITALS — BP 130/96 | HR 80 | Resp 16

## 2018-09-05 DIAGNOSIS — Z91018 Allergy to other foods: Secondary | ICD-10-CM

## 2018-09-05 DIAGNOSIS — J3089 Other allergic rhinitis: Secondary | ICD-10-CM | POA: Diagnosis not present

## 2018-09-05 DIAGNOSIS — J45901 Unspecified asthma with (acute) exacerbation: Secondary | ICD-10-CM | POA: Diagnosis not present

## 2018-09-05 DIAGNOSIS — J4550 Severe persistent asthma, uncomplicated: Secondary | ICD-10-CM

## 2018-09-05 MED ORDER — BUDESONIDE-FORMOTEROL FUMARATE 160-4.5 MCG/ACT IN AERO: 2.0000 | INHALATION_SPRAY | Freq: Two times a day (BID) | RESPIRATORY_TRACT | 5 refills | Status: DC

## 2018-09-05 MED ORDER — ALBUTEROL SULFATE HFA 108 (90 BASE) MCG/ACT IN AERS: INHALATION_SPRAY | RESPIRATORY_TRACT | 0 refills | Status: DC

## 2018-09-05 MED ORDER — MOMETASONE FUROATE 200 MCG/ACT IN AERO: 2.0000 | INHALATION_SPRAY | Freq: Two times a day (BID) | RESPIRATORY_TRACT | 1 refills | Status: DC

## 2018-09-05 NOTE — Progress Notes (Signed)
Follow-up Note  Referring Provider: Ardith Dark, MD Primary Provider: Ardith Dark, MD Date of Office Visit: 09/05/2018  Subjective:   Gerald Palmer (DOB: 1976-11-15) is a 42 y.o. male who [facility] in re-evaluation of the following:  HPI: Gerald Palmer returns to this clinic in reevaluation of his asthma and allergic rhinoconjunctivitis and history of food allergy directed against shellfish.  I have not seen him in this clinic since 03 August 2016.  He has been seen multiple times by other people in his clinic usually for asthma exacerbations requiring prednisone.  He has required 2 courses of systemic steroids this spring.  His last dose was May 2019.  His requirement for bronchodilator at this point in time is 2-3 times per day.  He continues to use Symbicort on a regular basis.  For the most part he thinks that his nose is doing relatively well.  Allergies as of 09/05/2018      Reactions   Aspirin    unknown   Ibuprofen Hives   High doses cause hives   Nsaids Hives   Shellfish Allergy Itching   Eyes swell      Medication List      albuterol 108 (90 Base) MCG/ACT inhaler Commonly known as:  PROVENTIL HFA;VENTOLIN HFA INHALE 2 PUFFS INTO THE LUNGS EVERY 4 TO 6 HOURS AS NEEDED FOR COUGH OR WHEEZING   allopurinol 300 MG tablet Commonly known as:  ZYLOPRIM Take 1 tablet (300 mg total) by mouth daily.   budesonide-formoterol 160-4.5 MCG/ACT inhaler Commonly known as:  SYMBICORT Inhale 2 puffs into the lungs 2 (two) times daily.   colchicine 0.6 MG tablet Take 1 tablet (0.6 mg total) by mouth daily as needed (gout).   escitalopram 10 MG tablet Commonly known as:  LEXAPRO Take 1 tablet (10 mg total) by mouth daily.   fluticasone 50 MCG/ACT nasal spray Commonly known as:  FLONASE 1 spray by Each Nare route daily. allergy   ipratropium-albuterol 0.5-2.5 (3) MG/3ML Soln Commonly known as:  DUONEB INHALE THE CONTENTS OF  ONE VIAL IN NEBULIZER EVERY 4 TO 6 HOURS AS NEEDED FOR COUGH OR WHEEZE   montelukast 10 MG tablet Commonly known as:  SINGULAIR Take one tablet once daily       Past Medical History:  Diagnosis Date  . Asthma   . Hypertension     Past Surgical History:  Procedure Laterality Date  . ACHILLES TENDON SURGERY    . ANKLE FRACTURE SURGERY Right   . TONSILLECTOMY      Review of systems negative except as noted in HPI / PMHx or noted below:  Review of Systems  Constitutional: Negative.   HENT: Negative.   Eyes: Negative.   Respiratory: Negative.   Cardiovascular: Negative.   Gastrointestinal: Negative.   Genitourinary: Negative.   Musculoskeletal: Negative.   Skin: Negative.   Neurological: Negative.   Endo/Heme/Allergies: Negative.   Psychiatric/Behavioral: Negative.      Objective:   Vitals:   09/05/18 1658  BP: (!) 130/96  Pulse: 80  Resp: 16          Physical Exam  HENT:  Head: Normocephalic.  Right Ear: Tympanic membrane, external ear and ear canal normal.  Left Ear: Tympanic membrane, external ear and ear canal normal.  Nose: Nose normal. No mucosal edema or rhinorrhea.  Mouth/Throat: Uvula is midline, oropharynx is clear and moist and mucous membranes are normal. No oropharyngeal exudate.  Eyes:  Conjunctivae are normal.  Neck: Trachea normal. No tracheal tenderness present. No tracheal deviation present. No thyromegaly present.  Cardiovascular: Normal rate, regular rhythm, S1 normal, S2 normal and normal heart sounds.  No murmur heard. Pulmonary/Chest: Breath sounds normal. No stridor. No respiratory distress. He has no wheezes. He has no rales.  Musculoskeletal: He exhibits no edema.  Lymphadenopathy:       Head (right side): No tonsillar adenopathy present.       Head (left side): No tonsillar adenopathy present.    He has no cervical adenopathy.  Neurological: He is alert.  Skin: No rash noted. He is not diaphoretic. No erythema. Nails show no  clubbing.    Diagnostics:    Spirometry was performed and demonstrated an FEV1 of 1.96 at 61 % of predicted.  The patient had an Asthma Control Test with the following results: ACT Total Score: 14.    Assessment and Plan:   1. Not well controlled severe persistent asthma   2. Perennial allergic rhinitis   3. Food allergy   4. Asthma with acute exacerbation, unspecified asthma severity, unspecified whether persistent      1.  Continue Symbicort 160 - 2 inhalations twice a day  2.  Start Asmanex 200 - 2 inhalations twice a day  3.  Continue Flonase - 1 spray each nostril twice a day  4.  If needed:   A.  Proventil HFA or DuoNeb every 4-6 hours  B.  OTC antihistamine  C.  EpiPen  5.  Blood -CBC w/diff.  Biological agent?  6.  Return to clinic in 4 weeks or earlier if problem  7.  Obtain a flu vaccine this fall  Gerald Palmer has basically failed standard medical therapy for his atopic respiratory disease and he is now a candidate for a biological agent.  In the past he has tried omalizumab but because of a logistical issue could not continue to use that medication.  Based upon his results of his blood test will make a choice about a biological agent and until that point in time he will use a very high dose of inhaled steroid as noted above.  I will see him back in his clinic in 4 weeks or earlier if there is a problem.  Laurette SchimkeEric Kozlow, MD Allergy / Immunology [facility]

## 2018-09-05 NOTE — Telephone Encounter (Signed)
Copied from CRM [id number]. Topic: General - Other >> Sep 05, 2018  5:51 PM Mcneil, Ja-Kwan wrote: Reason for CRM: Pt states he had labs done a few weeks ago and he would like the results. Pt requests call back

## 2018-09-05 NOTE — Patient Instructions (Addendum)
  1.  Continue Symbicort 160 - 2 inhalations twice a day  2.  Start Asmanex 200 - 2 inhalations twice a day  3.  Continue Flonase - 1 spray each nostril twice a day  4.  If needed:   A.  Proventil HFA or DuoNeb every 4-6 hours  B.  OTC antihistamine  C.  EpiPen  5.  Blood -CBC w/diff.  Biological agent?  6.  Return to clinic in 4 weeks or earlier if problem  7.  Obtain a flu vaccine this fall

## 2018-09-06 ENCOUNTER — Encounter: Payer: Self-pay | Admitting: Allergy and Immunology

## 2018-09-06 LAB — CBC WITH DIFFERENTIAL
BASOS ABS: 0.1 10*3/uL (ref 0.0–0.2)
Basos: 1 %
EOS (ABSOLUTE): 0.3 10*3/uL (ref 0.0–0.4)
Eos: 4 %
Hematocrit: 41.4 % (ref 37.5–51.0)
Hemoglobin: 14.2 g/dL (ref 13.0–17.7)
IMMATURE GRANULOCYTES: 0 %
Immature Grans (Abs): 0 10*3/uL (ref 0.0–0.1)
LYMPHS ABS: 3.5 10*3/uL — AB (ref 0.7–3.1)
Lymphs: 47 %
MCH: 26.9 pg (ref 26.6–33.0)
MCHC: 34.3 g/dL (ref 31.5–35.7)
MCV: 79 fL (ref 79–97)
MONOCYTES: 7 %
Monocytes Absolute: 0.5 10*3/uL (ref 0.1–0.9)
Neutrophils Absolute: 3 10*3/uL (ref 1.4–7.0)
Neutrophils: 41 %
RBC: 5.27 x10E6/uL (ref 4.14–5.80)
RDW: 14.5 % (ref 12.3–15.4)
WBC: 7.4 10*3/uL (ref 3.4–10.8)

## 2018-09-06 NOTE — Telephone Encounter (Signed)
Attempted to call patient.  Voicemail full.  Will attempt to call again tomorrow.

## 2018-09-07 ENCOUNTER — Encounter

## 2018-09-07 ENCOUNTER — Ambulatory Visit: Payer: Federal, State, Local not specified - PPO | Admitting: Sports Medicine

## 2018-09-07 NOTE — Telephone Encounter (Signed)
Unable to reach patient again.  Mailbox full.  I have been trying to reach the patient since the end of August to deliver lab results.  Also mailed a letter to his home.  Lab results are below.  "Blood counts are normal.  Thyroid level is normal Electrolytes, kidney function, and liver function are normal. His blood sugar level is high and his cholesterol level is high. He would benefit from starting metformin to lower his blood sugar numbers and reduce his risk of heart attack and stroke. Please send in the XR form 750mg  daily if he is willing to start it. He would also benefit from starting lipitor 40mg  daily to lower his cholesterol and prevent heart attack and stroke. Please send this in.  I would like for him to come back in 3 months to recheck his A1c."  CRM has been started.

## 2018-09-08 ENCOUNTER — Encounter: Payer: Self-pay | Admitting: Sports Medicine

## 2018-09-11 ENCOUNTER — Telehealth: Payer: Self-pay | Admitting: *Deleted

## 2018-09-11 NOTE — Telephone Encounter (Signed)
Result note from 08/07/18 read to patient; verbalizes understanding. Pt is agreeable to medications. Also states he was prescribed lexapro and is requesting alternative medication; states making him too drowsy. Has tried taking it at night "Still drowsy all day." Also states he has not heard from sleep clinic, however he is not able to retrieve VM and is concerned he missed call. Pt will need to CB to set up appt for A1C.  Telephone encounter as result [facility].

## 2018-09-12 ENCOUNTER — Other Ambulatory Visit: Payer: Self-pay

## 2018-09-12 MED ORDER — METFORMIN HCL ER 750 MG PO TB24: 750.0000 mg | ORAL_TABLET | Freq: Every day | ORAL | 3 refills | Status: DC

## 2018-09-12 MED ORDER — ATORVASTATIN CALCIUM 40 MG PO TABS: 40.0000 mg | ORAL_TABLET | Freq: Every day | ORAL | 3 refills | Status: DC

## 2018-09-12 NOTE — Telephone Encounter (Signed)
Rx sent to pharmacy   

## 2018-09-13 NOTE — Telephone Encounter (Signed)
Patient will need to come in for an appointment to discuss changing his medication (he was supposed to follow up 4-6 weeks from last office visit).  Dr. [name] will not be in the office until Tuesday of next week (09/19/2018).  Patient was referred to pulmonology for his sleep study and they have attempted to call him several times.  He can call them to schedule at [phone].  Attempted to contact patient but could not reach him and his voicemail is full.

## 2018-09-14 ENCOUNTER — Encounter: Payer: Self-pay | Admitting: *Deleted

## 2018-09-15 NOTE — Telephone Encounter (Signed)
Attempted to contact patient.  No answer, voicemail full.

## 2018-09-29 ENCOUNTER — Encounter: Payer: Self-pay | Admitting: Physician Assistant

## 2018-09-29 ENCOUNTER — Ambulatory Visit (INDEPENDENT_AMBULATORY_CARE_PROVIDER_SITE_OTHER): Payer: Federal, State, Local not specified - PPO | Admitting: Physician Assistant

## 2018-09-29 VITALS — BP 138/90 | HR 84 | Temp 98.8°F | Ht 67.0 in | Wt 252.5 lb

## 2018-09-29 DIAGNOSIS — J45901 Unspecified asthma with (acute) exacerbation: Secondary | ICD-10-CM

## 2018-09-29 MED ORDER — PREDNISONE 50 MG PO TABS: ORAL_TABLET | ORAL | 0 refills | Status: DC

## 2018-09-29 MED ORDER — METHYLPREDNISOLONE ACETATE 80 MG/ML IJ SUSP
80.0000 mg | Freq: Once | INTRAMUSCULAR | Status: AC
  Administered 2018-09-29: 80 mg via INTRAMUSCULAR

## 2018-09-29 NOTE — Progress Notes (Signed)
Gerald Palmer is a 42 y.o. male here for a new problem.   I acted as a Neurosurgeon for [employer], PA-C Corky Mull, LPN  History of Present Illness:   Chief Complaint  Patient presents with  . Asthma    Asthma  He complains of chest tightness, shortness of breath and wheezing. This is a recurrent problem. Episode onset: Started this past Wednesday. The problem has been gradually worsening. Associated symptoms include dyspnea on exertion and headaches. Pertinent negatives include no ear congestion, ear pain, fever, postnasal drip, sneezing or sore throat. His symptoms are aggravated by change in weather. Relieved by: Using Albuterol and Doneb nebulizer as needed. He reports minimal improvement on treatment. His past medical history is significant for asthma.   Used his albuterol inhaler 6 times today, 10 times yesterday.  Cannot afford Symbicort and Asmanex, both are approx $35 for each. Has not started either.  Past Medical History:  Diagnosis Date  . Asthma   . Hypertension      Social History   Socioeconomic History  . Marital status: Single    Spouse name: Not on file  . Number of children: Not on file  . Years of education: Not on file  . Highest education level: Not on file  Occupational History  . Not on file  Social Needs  . Financial resource strain: Not on file  . Food insecurity:    Worry: Not on file    Inability: Not on file  . Transportation needs:    Medical: Not on file    Non-medical: Not on file  Tobacco Use  . Smoking status: Former Games developer  . Smokeless tobacco: Never Used  Substance and Sexual Activity  . Alcohol use: Yes    Comment: occasionally  . Drug use: No  . Sexual activity: Yes  Lifestyle  . Physical activity:    Days per week: Not on file    Minutes per session: Not on file  . Stress: Not on file  Relationships  . Social connections:    Talks on phone: Not on file    Gets together: Not on file    Attends religious service:  Not on file    Active member of club or organization: Not on file    Attends meetings of clubs or organizations: Not on file    Relationship status: Not on file  . Intimate partner violence:    Fear of current or ex partner: Not on file    Emotionally abused: Not on file    Physically abused: Not on file    Forced sexual activity: Not on file  Other Topics Concern  . Not on file  Social History Narrative  . Not on file    Past Surgical History:  Procedure Laterality Date  . ACHILLES TENDON SURGERY    . ANKLE FRACTURE SURGERY Right   . TONSILLECTOMY      Family History  Problem Relation Age of Onset  . Hypertension Mother   . Arthritis Mother   . Diabetes Mother   . Hyperlipidemia Mother   . Asthma Father   . Hyperlipidemia Father   . Hypertension Father   . Stroke Father   . Hypertension Brother   . Hyperlipidemia Brother   . Arthritis Maternal Grandmother     Allergies  Allergen Reactions  . Aspirin     unknown  . Ibuprofen Hives    High doses cause hives  . Nsaids Hives  . Shellfish Allergy Itching  Eyes swell    Current Medications:   Current Outpatient Medications:  .  albuterol (PROVENTIL HFA;VENTOLIN HFA) 108 (90 Base) MCG/ACT inhaler, INHALE 2 PUFFS INTO THE LUNGS EVERY 4 TO 6 HOURS AS NEEDED FOR COUGH OR WHEEZING, Disp: 42.5 g, Rfl: 0 .  allopurinol (ZYLOPRIM) 300 MG tablet, Take 1 tablet (300 mg total) by mouth daily., Disp: 30 tablet, Rfl: 5 .  colchicine 0.6 MG tablet, Take 1 tablet (0.6 mg total) by mouth daily as needed (gout)., Disp: 30 tablet, Rfl: 5 .  fluticasone (FLONASE) 50 MCG/ACT nasal spray, 1 spray by Each Nare route daily. allergy, Disp: , Rfl:  .  ipratropium-albuterol (DUONEB) 0.5-2.5 (3) MG/3ML SOLN, INHALE THE CONTENTS OF ONE VIAL IN NEBULIZER EVERY 4 TO 6 HOURS AS NEEDED FOR COUGH OR WHEEZE, Disp: 540 mL, Rfl: 1 .  montelukast (SINGULAIR) 10 MG tablet, Take one tablet once daily, Disp: 30 tablet, Rfl: 5 .  atorvastatin (LIPITOR)  40 MG tablet, Take 1 tablet (40 mg total) by mouth daily. (Patient not taking: Reported on 09/29/2018), Disp: 30 tablet, Rfl: 3 .  budesonide-formoterol (SYMBICORT) 160-4.5 MCG/ACT inhaler, Inhale 2 puffs into the lungs 2 (two) times daily. (Patient not taking: Reported on 09/29/2018), Disp: 1 Inhaler, Rfl: 5 .  metFORMIN (GLUCOPHAGE-XR) 750 MG 24 hr tablet, Take 1 tablet (750 mg total) by mouth daily with breakfast. (Patient not taking: Reported on 09/29/2018), Disp: 30 tablet, Rfl: 3 .  Mometasone Furoate (ASMANEX HFA) 200 MCG/ACT AERO, Inhale 2 puffs into the lungs 2 (two) times daily. (Patient not taking: Reported on 09/29/2018), Disp: 13 g, Rfl: 1 .  predniSONE (DELTASONE) 50 MG tablet, Take 1 tablet daily, Disp: 5 tablet, Rfl: 0   Review of Systems:   Review of Systems  Constitutional: Negative for fever.  HENT: Negative for ear pain, postnasal drip, sneezing and sore throat.   Respiratory: Positive for shortness of breath and wheezing.   Cardiovascular: Positive for dyspnea on exertion.  Neurological: Positive for headaches.    Vitals:   Vitals:   09/29/18 1540  BP: 138/90  Pulse: 84  Temp: 98.8 F (37.1 C)  TempSrc: Oral  SpO2: 96%  Weight: 252 lb 8 oz (114.5 kg)  Height: 5\' 7"  (1.702 m)     Body mass index is 39.55 kg/m.  Physical Exam:   Physical Exam  Constitutional: He appears well-developed. He is cooperative.  Non-toxic appearance. He does not have a sickly appearance. He does not appear ill. No distress.  Cardiovascular: Normal rate, regular rhythm, S1 normal, S2 normal, normal heart sounds and normal pulses.  No LE edema  Pulmonary/Chest: Effort normal. He has wheezes in the right upper field, the right middle field, the right lower field, the left upper field, the left middle field and the left lower field.  Expiratory wheezes bilaterally; no accessory muscle usage or appearance of respiratory distress  Neurological: He is alert. GCS eye subscore is 4. GCS  verbal subscore is 5. GCS motor subscore is 6.  Skin: Skin is warm, dry and intact.  Psychiatric: He has a normal mood and affect. His speech is normal and behavior is normal.  Nursing note and vitals reviewed.   Assessment and Plan:    Gerald Palmer was seen today for asthma.  Diagnoses and all orders for this visit:  Asthma with acute exacerbation, unspecified asthma severity, unspecified whether persistent -     methylPREDNISolone acetate (DEPO-MEDROL) injection 80 mg  Other orders -     predniSONE (DELTASONE) 50  MG tablet; Take 1 tablet daily   Uncontrolled. Received Depo-Medrol injection in office and tolerated well. He declined breathing treatment or nebulizer solution for him, states that he has sufficient amount. No concerns for infection at this time. Start prednisone burst of 50 mg oral tomorrow. Provided him with coupon that should hopefully allow Symbicort to be free. Follow-up with asthma and allergy as recommended, or our office if needed. Red flags provided.  . Reviewed expectations re: course of current medical issues. . Discussed self-management of symptoms. . Outlined signs and symptoms indicating need for more acute intervention. . Patient verbalized understanding and all questions were answered. . See orders for this visit as documented in the electronic medical record. . Patient received an After-Visit Summary.  CMA or LPN served as scribe during this visit. History, Physical, and Plan performed by medical provider. The above documentation has been reviewed and is accurate and complete.  Jarold Motto, PA-C

## 2018-09-29 NOTE — Patient Instructions (Signed)
It was great to see you!  1. Start oral prednisone tomorrow. 2. Please try to pick up your inhalers and start using. 3. If worsening symptoms, please seek medical attention.  Take care,  Jarold Motto PA-C   Asthma, Acute Bronchospasm Acute bronchospasm caused by asthma is also referred to as an asthma attack. Bronchospasm means your air passages become narrowed. The narrowing is caused by inflammation and tightening of the muscles in the air tubes (bronchi) in your lungs. This can make it hard to breathe or cause you to wheeze and cough. What are the causes? Possible triggers are:  Animal dander from the skin, hair, or feathers of animals.  Dust mites contained in house dust.  Cockroaches.  Pollen from trees or grass.  Mold.  Cigarette or tobacco smoke.  Air pollutants such as dust, household cleaners, hair sprays, aerosol sprays, paint fumes, strong chemicals, or strong odors.  Cold air or weather changes. Cold air may trigger inflammation. Winds increase molds and pollens in the air.  Strong emotions such as crying or laughing hard.  Stress.  Certain medicines such as aspirin or beta-blockers.  Sulfites in foods and drinks, such as dried fruits and wine.  Infections or inflammatory conditions, such as a flu, cold, or inflammation of the nasal membranes (rhinitis).  Gastroesophageal reflux disease (GERD). GERD is a condition where stomach acid backs up into your esophagus.  Exercise or strenuous activity.  What are the signs or symptoms?  Wheezing.  Excessive coughing, particularly at night.  Chest tightness.  Shortness of breath. How is this diagnosed? Your health care provider will ask you about your medical history and perform a physical exam. A chest X-ray or blood testing may be performed to look for other causes of your symptoms or other conditions that may have triggered your asthma attack. How is this treated? Treatment is aimed at reducing  inflammation and opening up the airways in your lungs. Most asthma attacks are treated with inhaled medicines. These include quick relief or rescue medicines (such as bronchodilators) and controller medicines (such as inhaled corticosteroids). These medicines are sometimes given through an inhaler or a nebulizer. Systemic steroid medicine taken by mouth or given through an IV tube also can be used to reduce the inflammation when an attack is moderate or severe. Antibiotic medicines are only used if a bacterial infection is present. Follow these instructions at home:  Rest.  Drink plenty of liquids. This helps the mucus to remain thin and be easily coughed up. Only use caffeine in moderation and do not use alcohol until you have recovered from your illness.  Do not smoke. Avoid being exposed to secondhand smoke.  You play a critical role in keeping yourself in good health. Avoid exposure to things that cause you to wheeze or to have breathing problems.  Keep your medicines up-to-date and available. Carefully follow your health care provider's treatment plan.  Take your medicine exactly as prescribed.  When pollen or pollution is bad, keep windows closed and use an air conditioner or go to places with air conditioning.  Asthma requires careful medical care. See your health care provider for a follow-up as advised. If you are more than [redacted] weeks pregnant and you were prescribed any new medicines, let your obstetrician know about the visit and how you are doing. Follow up with your health care provider as directed.  After you have recovered from your asthma attack, make an appointment with your outpatient doctor to talk about ways to  reduce the likelihood of future attacks. If you do not have a doctor who manages your asthma, make an appointment with a primary care doctor to discuss your asthma. Get help right away if:  You are getting worse.  You have trouble breathing. If severe, call your local  emergency services (911 in the [place]  You develop chest pain or discomfort.  You are vomiting.  You are not able to keep fluids down.  You are coughing up yellow, green, brown, or bloody sputum.  You have a fever and your symptoms suddenly get worse.  You have trouble swallowing. This information is not intended to replace advice given to you by your health care provider. Make sure you discuss any questions you have with your health care provider. Document Released: 03/23/2007 Document Revised: 05/19/2016 Document Reviewed: 06/13/2013 Elsevier Interactive Patient Education  2017 [employer].

## 2018-10-10 ENCOUNTER — Ambulatory Visit: Payer: Federal, State, Local not specified - PPO | Admitting: Allergy and Immunology

## 2018-10-10 DIAGNOSIS — J309 Allergic rhinitis, unspecified: Secondary | ICD-10-CM

## 2018-10-20 ENCOUNTER — Other Ambulatory Visit: Payer: Self-pay | Admitting: Physician Assistant

## 2018-11-13 ENCOUNTER — Ambulatory Visit: Payer: Federal, State, Local not specified - PPO | Admitting: Family Medicine

## 2018-11-13 ENCOUNTER — Encounter: Payer: Self-pay | Admitting: Family Medicine

## 2018-11-13 VITALS — BP 134/84 | HR 87 | Temp 98.6°F | Ht 67.0 in | Wt 254.4 lb

## 2018-11-13 DIAGNOSIS — J4540 Moderate persistent asthma, uncomplicated: Secondary | ICD-10-CM | POA: Diagnosis not present

## 2018-11-13 DIAGNOSIS — R739 Hyperglycemia, unspecified: Secondary | ICD-10-CM

## 2018-11-13 DIAGNOSIS — Z23 Encounter for immunization: Secondary | ICD-10-CM

## 2018-11-13 DIAGNOSIS — G4730 Sleep apnea, unspecified: Secondary | ICD-10-CM

## 2018-11-13 DIAGNOSIS — F320 Major depressive disorder, single episode, mild: Secondary | ICD-10-CM | POA: Diagnosis not present

## 2018-11-13 DIAGNOSIS — E785 Hyperlipidemia, unspecified: Secondary | ICD-10-CM | POA: Diagnosis not present

## 2018-11-13 MED ORDER — BUPROPION HCL ER (XL) 150 MG PO TB24: 150.0000 mg | ORAL_TABLET | Freq: Every day | ORAL | 2 refills | Status: DC

## 2018-11-13 MED ORDER — PREDNISONE 50 MG PO TABS: ORAL_TABLET | ORAL | 0 refills | Status: DC

## 2018-11-13 NOTE — Assessment & Plan Note (Signed)
Continue metformin 750 mg daily.  He will follow-up with me in 3 months to recheck A1c.

## 2018-11-13 NOTE — Progress Notes (Signed)
   Subjective:  Gerald Palmer is a 42 y.o. male who presents today for same-day appointment with a chief complaint of asthma.   HPI:  Asthma, chronic problem, worsening Symptoms worsen within the last few days.  He has had more shortness of breath, chest tightness, and wheezing.  No clear precipitating factor.  Currently takes albuterol as needed.  Also on Asmanex 2 puffs twice daily.  No fevers or chills.  No sick contacts.  HLD, chronic problem Started on Lipitor 3 months ago.  He has tolerated this well without side effects.  Hyperglycemia, new problem Started on metformin 750 mg daily 3 months ago.  He has also tolerated this well without side effects.  Depression, chronic problem, stable Seen about 3 months ago for this.  At that time we started him on Lexapro 10 mg daily.  He tried this however was not able to tolerate due to side effects.  Has not been on any medications recently.  Currently symptoms are stable however not well controlled.  Sleep disordered breathing Would like another referral for sleep study.  ROS: Per HPI  PMH: He reports that he has quit smoking. He has never used smokeless tobacco. He reports that he drinks alcohol. He reports that he does not use drugs.  Objective:  Physical Exam: BP 134/84 (BP Location: Left Arm, Patient Position: Sitting, Cuff Size: Large)   Pulse 87   Temp 98.6 F (37 C) (Oral)   Ht 5\' 7"  (1.702 m)   Wt 254 lb 6.4 oz (115.4 kg)   SpO2 96%   BMI 39.84 kg/m   Wt Readings from Last 3 Encounters:  11/13/18 254 lb 6.4 oz (115.4 kg)  09/29/18 252 lb 8 oz (114.5 kg)  08/07/18 253 lb 9.6 oz (115 kg)  Gen: NAD, resting comfortably CV: RRR with no murmurs appreciated Pulm: NWOB, administer airflow bases bilaterally, otherwise CTAB GI: Normal bowel sounds present. Soft, Nontender, Nondistended.  Assessment/Plan:  Asthma Exam consistent with mild flare.  Start prednisone burst.  Continue Singulair.  Continue Asmanex.  Continue  albuterol as needed.  Discussed reasons to return to care.  Follow-up as needed.  Hyperglycemia Continue metformin 750 mg daily.  He will follow-up with me in 3 months to recheck A1c.  Dyslipidemia Continue Lipitor 40 mg daily.  Recheck lipid panel in 9 months.  Depression, major, single episode, mild (HCC) Start Wellbutrin 150 mg daily.  Follow-up with me in 3 months.  Sleep disordered breathing Referral for sleep study placed.  Preventative healthcare Flu shot given today.  [name], MD 11/13/2018 4:09 PM

## 2018-11-13 NOTE — Assessment & Plan Note (Addendum)
Continue Lipitor 40 mg daily.  Recheck lipid panel in 9 months.

## 2018-11-13 NOTE — Patient Instructions (Signed)
It was very nice to see you today!  Please start the prednisone.  Please take 1 pill a day for the next 5 days, then half a pill for 2 days after that.  Please continue using your inhalers.  We will start Wellbutrin today.  Please take 1 pill once daily in the morning.  Please let if your symptoms worsen or do not improve with this.  We will give your flu shot today.  I will also place another referral for you to get your sleep study done.  Come back to see me in 3 months, or sooner as needed.  Take care, Dr Jimmey RalphParker

## 2018-11-13 NOTE — Assessment & Plan Note (Signed)
Start Wellbutrin 150 mg daily.  Follow-up with me in 3 months.

## 2018-11-13 NOTE — Assessment & Plan Note (Signed)
Exam consistent with mild flare.  Start prednisone burst.  Continue Singulair.  Continue Asmanex.  Continue albuterol as needed.  Discussed reasons to return to care.  Follow-up as needed.

## 2018-11-21 ENCOUNTER — Telehealth: Payer: Self-pay

## 2018-11-21 NOTE — Telephone Encounter (Signed)
FMLA paperwork filled out, signed, faxed on 11/21/2018.

## 2018-11-24 NOTE — Telephone Encounter (Signed)
See note

## 2018-11-24 NOTE — Telephone Encounter (Signed)
Informed patient of below message.

## 2018-12-25 ENCOUNTER — Other Ambulatory Visit: Payer: Self-pay | Admitting: Family Medicine

## 2019-01-23 ENCOUNTER — Encounter: Payer: Self-pay | Admitting: Family Medicine

## 2019-01-23 ENCOUNTER — Ambulatory Visit (INDEPENDENT_AMBULATORY_CARE_PROVIDER_SITE_OTHER): Payer: Federal, State, Local not specified - PPO | Admitting: Family Medicine

## 2019-01-23 VITALS — BP 130/82 | HR 83 | Temp 98.1°F | Ht 67.0 in | Wt 260.0 lb

## 2019-01-23 DIAGNOSIS — G8929 Other chronic pain: Secondary | ICD-10-CM

## 2019-01-23 DIAGNOSIS — M25561 Pain in right knee: Secondary | ICD-10-CM | POA: Diagnosis not present

## 2019-01-23 MED ORDER — METHYLPREDNISOLONE ACETATE 80 MG/ML IJ SUSP
80.0000 mg | Freq: Once | INTRAMUSCULAR | Status: AC
  Administered 2019-01-23: 80 mg via INTRA_ARTICULAR

## 2019-01-23 NOTE — Patient Instructions (Signed)
It was very nice to see you today!  We injected your knee today.  Please let me know if you have worsening pain, redness, or swelling to the area.  Please continue using the Aleve as needed.  Please also keep the area and a compression sleeve and use ice as needed.  Please continue working on your home exercises.  Let you know if your symptoms worsen or do not improve over the next 1 to 2 weeks.  Take care, Dr [name]

## 2019-01-23 NOTE — Addendum Note (Signed)
Addended by: Koleen Distance on: 01/23/2019 04:02 PM   Modules accepted: Orders

## 2019-01-23 NOTE — Progress Notes (Signed)
   Chief Complaint:  Gerald Palmer is a 43 y.o. male who presents for same day appointment with a chief complaint of right knee pain.   Assessment/Plan:  Right Knee Pain No red flags.  Likely secondary to osteoarthritis flare. MRI last year showed mild to moderate degenerative changes. Discussed treatment options.  He elected for intra-articular steroid injection today.  This was performed as noted in below procedure note.  He tolerated well.  Recommended continuing Aleve for the next 5 to 7 days.  Also recommended ice and compression to the area.  Consider sports med orthopedics evaluate if no improvement with above.     Subjective:  HPI:  Right Knee Pain, acute problem Started a few days ago.  Stable over that time.  He has tried taking over-the-counter Aleve and ice which have helped.  He has not noticed any swelling to the area.  It is worse when bending his knee and with weightbearing.  He has had a bit of popping and clicking to the area as well.  No buckling.  No weakness or numbness.  No other obvious alleviating or aggravating factors.   ROS: Per HPI  PMH: He reports that he has quit smoking. He has never used smokeless tobacco. He reports current alcohol use. He reports that he does not use drugs.      Objective:  Physical Exam: BP 130/82 (BP Location: Left Arm, Patient Position: Sitting, Cuff Size: Large)   Pulse 83   Temp 98.1 F (36.7 C) (Oral)   Ht 5\' 7"  (1.702 m)   Wt 260 lb (117.9 kg)   SpO2 96%   BMI 40.72 kg/m   Wt Readings from Last 3 Encounters:  01/23/19 260 lb (117.9 kg)  11/13/18 254 lb 6.4 oz (115.4 kg)  09/29/18 252 lb 8 oz (114.5 kg)  Gen: NAD, resting comfortably MSK:  -Right knee: No deformities.  Tender to palpation along medial joint line.  Stable to varus and valgus stress.  Anterior and posterior drawer signs negative.  McMurray negative.  Crepitus with active range of motion noted. Neurovascularly intact distally.  Knee Injection Procedure  Note  Indication: Symptom relief of right Knee Pain.  Procedure Details  Verbal consent was obtained for the procedure. The joint was prepped with Betadine. Topical ethyl chloride was applied for anesthesia. A 22 gauge needle was inserted into the medial aspect of the joint.  3 ml 1% lidocaine and 1 ml of 40mg /cc Depo-Medrol was then injected into the joint. The needle was removed and the area cleansed and dressed.  Complications:  None; patient tolerated the procedure well.      Katina Degree. [name], MD 01/23/2019 3:13 PM

## 2019-03-14 ENCOUNTER — Other Ambulatory Visit: Payer: Self-pay | Admitting: Family Medicine

## 2019-03-15 ENCOUNTER — Ambulatory Visit (INDEPENDENT_AMBULATORY_CARE_PROVIDER_SITE_OTHER): Payer: Federal, State, Local not specified - PPO | Admitting: Family Medicine

## 2019-03-15 ENCOUNTER — Other Ambulatory Visit: Payer: Self-pay

## 2019-03-15 ENCOUNTER — Encounter: Payer: Self-pay | Admitting: Family Medicine

## 2019-03-15 ENCOUNTER — Telehealth: Payer: Self-pay | Admitting: Neurology

## 2019-03-15 DIAGNOSIS — J45901 Unspecified asthma with (acute) exacerbation: Secondary | ICD-10-CM

## 2019-03-15 DIAGNOSIS — M1000 Idiopathic gout, unspecified site: Secondary | ICD-10-CM

## 2019-03-15 DIAGNOSIS — J3089 Other allergic rhinitis: Secondary | ICD-10-CM | POA: Diagnosis not present

## 2019-03-15 DIAGNOSIS — J4540 Moderate persistent asthma, uncomplicated: Secondary | ICD-10-CM

## 2019-03-15 MED ORDER — ALBUTEROL SULFATE HFA 108 (90 BASE) MCG/ACT IN AERS: INHALATION_SPRAY | RESPIRATORY_TRACT | 0 refills | Status: DC

## 2019-03-15 MED ORDER — PREDNISONE 50 MG PO TABS: ORAL_TABLET | ORAL | 0 refills | Status: DC

## 2019-03-15 MED ORDER — MONTELUKAST SODIUM 10 MG PO TABS: ORAL_TABLET | ORAL | 3 refills | Status: DC

## 2019-03-15 MED ORDER — CETIRIZINE HCL 10 MG PO TABS: 10.0000 mg | ORAL_TABLET | Freq: Every day | ORAL | 11 refills | Status: AC

## 2019-03-15 NOTE — Telephone Encounter (Signed)
Called the patient to inform them that our office has placed new protocols in place for our office visits. Due to the virus pandemic our office is reducing our number of office visits in order to minimize the risk to our patients and healthcare providers. Patient is scheduled for April 1 visit, was calling to offer sooner apt but also discuss Virtual visit for the patient.   No answer and patient didn't have voicemail set up. If patient calls back I will speak with him.

## 2019-03-15 NOTE — Assessment & Plan Note (Signed)
Stable.  Continue Flonase.  Start Zyrtec.  Will have some improvement with prednisone burst as noted above.

## 2019-03-15 NOTE — Progress Notes (Signed)
    Chief Complaint:  Gerald Palmer is a 43 y.o. male who presents today for a virtual office visit with a chief complaint of asthma.   Assessment/Plan:  Asthma History consistent with mild flare.  No signs of respiratory distress.  Continue albuterol as needed.  Continue Singulair.  Start Zyrtec.  We will send in prednisone burst.  Discussed reasons to return to care and seek emergent care.  Allergic rhinitis Stable.  Continue Flonase.  Start Zyrtec.  Will have some improvement with prednisone burst as noted above.  Gout Stable.  Continue allopurinol 300 mg daily.  Check uric acid with next blood draw.    Subjective:  HPI:  # Asthma / Allergic Rhinitis  - Chronic problems, started worsening about a week ago.  Thinks had recent pulmonary uppercase worsen his symptoms.  He has noticed increased cough and wheeze.  He has been using his albuterol inhaler 4-6 times per day.  He has been compliant with Symbicort 2 puffs twice daily.  He is also been compliant with Singulair.  He has used Sudafed which is helped a little bit.  No other obvious alleviating or aggravating factors.  # Gout Chronic problem.  On allopurinol 300 mg daily and doing well.  ROS: Per HPI  PMH: He reports that he has quit smoking. He has never used smokeless tobacco. He reports current alcohol use. He reports that he does not use drugs.      Objective/Observations  Physical Exam: Gen: NAD, resting comfortably Pulm: Normal work of breathing.  Speaking in full sentences.  No appreciable wheeze or stridor. Neuro: Grossly normal, moves all extremities Psych: Normal affect and thought content  Virtual Visit via Video   I connected with Gerald Palmer on 03/15/19 at  1:20 PM EDT by a video enabled telemedicine application and verified that I am speaking with the correct person using two identifiers. I discussed the limitations of evaluation and management by telemedicine and the availability of in person  appointments. The patient expressed understanding and agreed to proceed.   Patient location: Home Provider location: [place] [facility] Persons participating in the virtual visit: Myself and patient     Katina Degree. [name], MD 03/15/2019 1:52 PM

## 2019-03-15 NOTE — Assessment & Plan Note (Signed)
History consistent with mild flare.  No signs of respiratory distress.  Continue albuterol as needed.  Continue Singulair.  Start Zyrtec.  We will send in prednisone burst.  Discussed reasons to return to care and seek emergent care.

## 2019-03-15 NOTE — Assessment & Plan Note (Signed)
Stable.  Continue allopurinol 300 mg daily.  Check uric acid with next blood draw. 

## 2019-03-19 NOTE — Telephone Encounter (Signed)
Called the patient about upcoming apt on 03/21/2019. No answer. Voicemail box is full and unable to leave a message.

## 2019-03-20 ENCOUNTER — Encounter: Payer: Self-pay | Admitting: Neurology

## 2019-03-20 ENCOUNTER — Telehealth: Payer: Self-pay | Admitting: Neurology

## 2019-03-20 NOTE — Telephone Encounter (Signed)
Made a 3rd attempt at calling the patient. No answer and still unable to leave a message

## 2019-03-20 NOTE — Telephone Encounter (Signed)
Pt returned call and I was able to inform them that our office has placed new protocols in place for our office visits. Due to the virus pandemic our office is reducing our number of office visits in order to minimize the risk to our patients and healthcare providers. Advised that our office is now providing the capability to offer the patients phone visits at this time. Informed of what that process looks like and informed that the telephone office visit will still be billed through insurance and due to [place] we need them to know since the appointment is taking place over the phone, we can't guarantee the security of the phone line. With that said if we do move forward I would have to get verbal consent to completed the call over the phone.  Pt gave verbal consent to completed the video visit. Pt's email is [email or web address]. Pt understands that the [employer] webex software must be downloaded and operational on the device pt plans to use for the visit. Reviewed the chart and was able to confirm and update meds, pharmacy and H&P. Pt verbalized understanding of downloading the app and having it ready to go at 9:45 am. Advised pt to call with any issues.

## 2019-03-21 ENCOUNTER — Encounter

## 2019-03-21 ENCOUNTER — Ambulatory Visit (INDEPENDENT_AMBULATORY_CARE_PROVIDER_SITE_OTHER): Payer: Federal, State, Local not specified - PPO | Admitting: Neurology

## 2019-03-21 ENCOUNTER — Other Ambulatory Visit: Payer: Self-pay

## 2019-03-21 DIAGNOSIS — G4730 Sleep apnea, unspecified: Secondary | ICD-10-CM

## 2019-03-21 DIAGNOSIS — R739 Hyperglycemia, unspecified: Secondary | ICD-10-CM

## 2019-03-21 DIAGNOSIS — G478 Other sleep disorders: Secondary | ICD-10-CM

## 2019-03-21 DIAGNOSIS — J3089 Other allergic rhinitis: Secondary | ICD-10-CM | POA: Diagnosis not present

## 2019-03-21 DIAGNOSIS — F320 Major depressive disorder, single episode, mild: Secondary | ICD-10-CM

## 2019-03-21 DIAGNOSIS — J4540 Moderate persistent asthma, uncomplicated: Secondary | ICD-10-CM | POA: Diagnosis not present

## 2019-03-21 DIAGNOSIS — R0683 Snoring: Secondary | ICD-10-CM

## 2019-03-21 DIAGNOSIS — E785 Hyperlipidemia, unspecified: Secondary | ICD-10-CM

## 2019-03-21 DIAGNOSIS — M1000 Idiopathic gout, unspecified site: Secondary | ICD-10-CM

## 2019-03-22 ENCOUNTER — Other Ambulatory Visit: Payer: Self-pay | Admitting: Family Medicine

## 2019-03-23 NOTE — Telephone Encounter (Signed)
Will refill but he needs OV if not improving.  Katina Degree. [name], MD 03/23/2019 11:46 AM

## 2019-03-23 NOTE — Telephone Encounter (Signed)
Patient requeting refill due to itchy throat,watery eyes,tightness in chest and SOB when outside,symptoms started Tuesday.Explained to patient he might need a visit.

## 2019-04-08 ENCOUNTER — Encounter: Payer: Self-pay | Admitting: Neurology

## 2019-04-08 DIAGNOSIS — G478 Other sleep disorders: Secondary | ICD-10-CM | POA: Insufficient documentation

## 2019-04-08 DIAGNOSIS — G4730 Sleep apnea, unspecified: Secondary | ICD-10-CM | POA: Insufficient documentation

## 2019-04-08 NOTE — Patient Instructions (Signed)

## 2019-04-08 NOTE — Progress Notes (Signed)
Virtual Visit via Video Note  I connected with Gerald Palmer on 03-21-2019 at 10:00 AM EDT by a video enabled telemedicine application and verified that I am speaking with the correct person using two identifiers.   I discussed the limitations of evaluation and management by telemedicine and the availability of in person appointments. The patient expressed understanding and agreed to proceed.    Gerald Palmer Jerman Tinnon, MD     [facility]   Provider:  Melvyn Palmer  Gerald Warmack, MD   Primary Care Physician:  Gerald DarkParker, Caleb M, MD   Referring Provider: Ardith DarkParker, Caleb M, MD    HPI:  Gerald Palmer is a 43 y.o. male , seen on 03-21-2019  in a referral from Dr. Jimmey Palmer for a sleep apnea evaluation.   Chief complaint according to patient : Gerald Palmer is a 43 year old gentleman who recently presented to his primary care physician with a chief complaint of moderate and persistent asthma.  He presented with normal blood pressure readings, a pulse of 87, a temperature of 37 C, at a height of 5 foot 7 inches and a weight of 254 pounds.  He has reached a BMI of 39.8 as of November 2019.     The patient does acknowledge that he gained about 15 pounds over the last 12 months in addition to being already overweight he is waiting for a full physical in late April 2020.  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  He describes his bedroom as cool, quiet and dark.  He states normally he is promptly asleep but he moves a lot prone and back he also sleeps on his sides in a flat bed usually using one pillow only for head support his girlfriend has reported that his snoring is loud and bothersome to her she also has witnessed apneas.   Twice at night he has bathroom breaks.     Sleep medical history :He was referred here to be evaluated for sleep disordered breathing.  He had a tonsillectomy as an adult in the year 2014 followed by UPPP and septoplasty, he describes his condition of gout is sometimes painful and affecting his sleep.  He has a moderate degree of retrognathia.  He has already had 2 or 3 sleep studies in the past all of which have confirmed that he has obstructive sleep apnea.  [facility] he was also observed by the nursing staff that he had which confirmed that he had apnea.  He still has nasal congestion , continues to breathe through his mouth-in spite of the tonsillectomy UPPP and septoplasty.   He also carries a diagnosis of depression currently treated on Lexapro, hyperglycemia which have been a new problem and he started on metformin 750 mg about 7 months ago so far he has tolerated this without side effects.  At the same time he started on Lipitor for hyperlipidemia which has become a chronic problem.  He also states that he has no trouble with myalgia or any other side effects.  He is on Asmanex 2 puffs twice daily and takes daily albuterol.  He has had more and more shortness of breath chest tightness  and wheezing without a clear precipitating factor as of November last year.     Family medical/ sleep history: The patient's mother had also obstructive sleep apnea and his father was a snorer who died in his sleep at age 20 he has 2 siblings a brother and a sister he believes both are snoring but he is not sure if they were diagnosed with apnea.   Social history: The patient works for the post office he has a girlfriend, he does not use tobacco or does not regularly exercise he may drink 2 beers on the weekend but many weeks he does not consume any alcohol, caffeine consume is not in form of tea or coffee but 2 or 3 sodas a day.  Review of Systems: Out of a complete 14 system  review, the patient complains of only the following symptoms, and all other reviewed systems are negative. Snoring, restless sleep, nocturia, apnea, excessive daytime sleepiness, morbid obesity,  How likely are you to doze in the following situations: 0 = not likely, 1 = slight chance, 2 = moderate chance, 3 = high chance  Sitting and Reading? Watching Television? Sitting inactive in a public place (theater or meeting)? Lying down in the afternoon when circumstances permit? Sitting and talking to someone? Sitting quietly after lunch without alcohol? In a car, while stopped for a few minutes in traffic? As a passenger in a car for an hour without a break?  Total = 12 out of 24 points.     Social History   Socioeconomic History   Marital status: Single    Spouse name: Not on file   Number of children: Not on file   Years of education: Not on file   Highest education level: Not on file  Occupational History   Not on file  Social Needs   Financial resource strain: Not on file   Food insecurity:    Worry: Not on file    Inability: Not on file   Transportation needs:    Medical: Not on file    Non-medical: Not on file  Tobacco Use   Smoking status: Former Smoker   Smokeless tobacco: Never Used  Substance and Sexual Activity   Alcohol use: Yes    Comment: occasionally   Drug use: No   Sexual activity: Yes  Lifestyle   Physical activity:    Days per week: Not on file    Minutes per session: Not on file   Stress: Not on file  Relationships   Social connections:    Talks on phone: Not on file    Gets together: Not on file    Attends religious service: Not on file    Active member of club or organization: Not on file    Attends meetings of clubs or organizations: Not on file    Relationship status: Not on file   Intimate partner violence:    Fear of current or ex partner: Not on file    Emotionally abused: Not on file    Physically abused: Not on  file    Forced sexual activity: Not on file  Other Topics Concern   Not on file  Social History Narrative   Not on file    Family History  Problem Relation Age of Onset   Hypertension Mother    Arthritis Mother    Diabetes Mother    Hyperlipidemia Mother    Asthma Father    Hyperlipidemia Father    Hypertension Father  Stroke Father    Hypertension Brother    Hyperlipidemia Brother    Arthritis Maternal Grandmother     Past Medical History:  Diagnosis Date   Asthma    Hypertension     Past Surgical History:  Procedure Laterality Date   ACHILLES TENDON SURGERY     ANKLE FRACTURE SURGERY Right    TONSILLECTOMY      Current Outpatient Medications  Medication Sig Dispense Refill   albuterol (PROVENTIL HFA;VENTOLIN HFA) 108 (90 Base) MCG/ACT inhaler Use every 4-6 hours as needed. 42.5 g 0   allopurinol (ZYLOPRIM) 300 MG tablet Take 1 tablet (300 mg total) by mouth daily. 30 tablet 5   budesonide-formoterol (SYMBICORT) 160-4.5 MCG/ACT inhaler Inhale 2 puffs into the lungs 2 (two) times daily. 1 Inhaler 5   buPROPion (WELLBUTRIN XL) 150 MG 24 hr tablet Take 1 tablet (150 mg total) by mouth daily. 30 tablet 2   cetirizine (ZYRTEC) 10 MG tablet Take 1 tablet (10 mg total) by mouth daily. 30 tablet 11   colchicine 0.6 MG tablet Take 1 tablet (0.6 mg total) by mouth daily as needed (gout). 30 tablet 5   fluticasone (FLONASE) 50 MCG/ACT nasal spray 1 spray by Each Nare route daily. allergy     ipratropium-albuterol (DUONEB) 0.5-2.5 (3) MG/3ML SOLN INHALE THE CONTENTS OF ONE VIAL IN NEBULIZER EVERY 4 TO 6 HOURS AS NEEDED FOR COUGH OR WHEEZE 540 mL 1   metFORMIN (GLUCOPHAGE-XR) 750 MG 24 hr tablet Take 1 tablet (750 mg total) by mouth daily with breakfast. (Patient not taking: Reported on 03/20/2019) 30 tablet 3   montelukast (SINGULAIR) 10 MG tablet Take one tablet once daily 90 tablet 3   predniSONE (DELTASONE) 50 MG tablet TAKE 1 TABLET BY MOUTH  DAILY FOR 5 DAYS THEN TAKE 1/2 TABLET BY MOUTH DAILY FOR 2 DAYS 6 tablet 0   No current facility-administered medications for this visit.     Allergies as of 03/21/2019 - Review Complete 03/20/2019  Allergen Reaction Noted   Aspirin  06/10/2018   Ibuprofen Hives 08/17/2014   Nsaids Hives 06/26/2018   Shellfish allergy Itching 11/11/2014    Vitals: There were no vitals taken for this visit. Last Weight:  Wt Readings from Last 1 Encounters:  01/23/19 260 lb (117.9 kg)   NOM:[facility] is no height or weight on file to calculate BMI.      Last Height:   Ht Readings from Last 1 Encounters:  01/23/19 5\' 7"  (1.702 Palmer)    Observations/Objective:   General: The patient is awake, alert and appears not in acute distress. The patient is well groomed. The patient has facial hair.  Head: Normocephalic, atraumatic. Mallampati 4   neck circumference:19". Nasal airflow congested ,  Retrognathia is seen.  Cardiovascular:  Deferred. Respiratory: no tachypnoea at rest/  Neurologic exam : The patient is awake and alert, oriented to place and time.   Memory subjective  described as intact.  Attention span & concentration ability appears normal.  Speech is fluent,  without  dysarthria, there is mild "nasal" dysphonia, no  aphasia.  Mood and affect are appropriate. The patient reports a longstanding history of depression, currently controlled.   Cranial nerves: Pupils are equal . Extraocular movements  in vertical and horizontal planes intact.  Facial motor strength is symmetric and tongue and uvula move midline. Shoulder shrug was symmetrical.   Motor exam: Normal and symmetric  muscle bulk and symmetric ROM strength in upper  extremities.  Coordination: Alternating movements in  the fingers/hands  normal without evidence of ataxia, dysmetria or tremor.  Gait and station: Patient walks without assistive device    Assessment:  After physical and neurologic examination, review of laboratory  studies,  Personal review of imaging studies, reports of other /same  Imaging studies, results of polysomnography and / or neurophysiology testing and pre-existing records as far as provided in visit., my assessment is   1) based on the increasing body mass index, the patient's restless sleep and his recent diagnosis of impaired glucose tolerance, hyperlipidemia and worsening of asthma-shortness of breath I find it necessary to order an home sleep test for the patient.  He may be after all a candidate for a dental device should apnea be unassociated with oxygen desaturations and not REM sleep dependent.  However his main risk factor is obesity , the second is retrognathia.  PLAN :  I discussed with him that he should consider seeing the medical weight and  wellness clinic and that a home sleep test will be ordered for him in order to treat his apnea which I am confident will be found.   It will help weight loss if he is able to advance his bedtime by about an hour and is able to reach and more restorative and refreshing quality of his sleep.   The patient was advised of the nature of the diagnosed disorder , the treatment options and the  risks for general health and wellness arising from not treating the condition.   I spent more than 28 minutes of non-face to face time with the patient.  Greater than 50% of time was spent in counseling and coordination of care. We have discussed the diagnosis and differential and I answered the patient's questions.    Plan:  I discussed the assessment and treatment plan with the patient.   HST - if OSA will be diagnosed for the third time, we will address the results in person if CPAP is not tolerated , and refer to [facility] or dental procedure if possible.   Referral to medical weight and wellness clinic.   The patient was provided an opportunity to ask questions and all were answered. The patient agreed with the plan and demonstrated an understanding of the  instructions.   The patient was advised to call back or seek an in-person evaluation if the symptoms worsen or if the condition fails to improve as anticipated.  I provided 28 minutes of non-face-to-face time during this encounter.  RV in 3 month , NP- please obtain PHQ 12 depression score.   Gerald Novas, MD 03-29-2019, 5:02 PM  Certified in Neurology by ABPN Certified in Sleep Medicine by [facility] [address] [place], [place] [address]           I discussed the assessment and treatment plan with the patient. The patient was provided an opportunity to ask questions and all were answered. The patient agreed with the plan and demonstrated an understanding of the instructions.   The patient was advised to call back or seek an in-person evaluation if the symptoms worsen or if the condition fails to improve as anticipated.  I provided 28 minutes of non-face-to-face time during this encounter.

## 2019-05-07 ENCOUNTER — Ambulatory Visit (INDEPENDENT_AMBULATORY_CARE_PROVIDER_SITE_OTHER): Payer: Federal, State, Local not specified - PPO | Admitting: Neurology

## 2019-05-07 ENCOUNTER — Other Ambulatory Visit: Payer: Self-pay

## 2019-05-07 DIAGNOSIS — E785 Hyperlipidemia, unspecified: Secondary | ICD-10-CM

## 2019-05-07 DIAGNOSIS — G4730 Sleep apnea, unspecified: Secondary | ICD-10-CM

## 2019-05-07 DIAGNOSIS — J4540 Moderate persistent asthma, uncomplicated: Secondary | ICD-10-CM

## 2019-05-07 DIAGNOSIS — G4733 Obstructive sleep apnea (adult) (pediatric): Secondary | ICD-10-CM | POA: Diagnosis not present

## 2019-05-07 DIAGNOSIS — R739 Hyperglycemia, unspecified: Secondary | ICD-10-CM

## 2019-05-07 DIAGNOSIS — M1000 Idiopathic gout, unspecified site: Secondary | ICD-10-CM

## 2019-05-07 DIAGNOSIS — G478 Other sleep disorders: Secondary | ICD-10-CM

## 2019-05-07 DIAGNOSIS — J3089 Other allergic rhinitis: Secondary | ICD-10-CM

## 2019-05-07 DIAGNOSIS — R0683 Snoring: Secondary | ICD-10-CM

## 2019-05-15 ENCOUNTER — Telehealth: Payer: Self-pay | Admitting: Neurology

## 2019-05-15 DIAGNOSIS — G4733 Obstructive sleep apnea (adult) (pediatric): Secondary | ICD-10-CM | POA: Insufficient documentation

## 2019-05-15 NOTE — Addendum Note (Signed)
Addended by: Melvyn Novas on: 05/15/2019 02:18 PM   Modules accepted: Orders

## 2019-05-15 NOTE — Procedures (Signed)
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  The patient does acknowledge that he gained about 15 pounds over the last 12 months in addition to being already overweight, he reports excessive daytime sleepiness, a history of depression, hyperglycemia, and had UPPP and septoplasty. He is waiting for a face to face full physical later in 2020.      Diagnosis:     Severest degree of sleep apnea at AHI of 69.1 /h. loud snoring, over 30 minutes of hypoxia at or below 88% spO2. Supine AHI was 83.9/h.   Recommendations:     Immediately starting autotitration CPAP, 6-20 cm water, 3 cm EPR- heated humidity and interface of patient's choice and comfort. Follow up RV with NP/MD within 90 days of use.     Electronically Signed:  Melvyn Novas, MD    05-14-2019           Sleep Summary  Oxygen Saturation Statistics   Start Study Time: End Study Time: Total Recording Time:  10:21:05 PM          7:15:22 AM   8 h, 54 min  Total Sleep Time % REM of Sleep Time:  7 h, 25 min  7.6    Mean: 94 Minimum: 60 Maximum: 100  Mean of Desaturations Nadirs (%):   89  Oxygen Desaturation %:   4-9 10-20 >20 Total  Events Number Total   296  145 64.9 31.8  15 3.3  456 100.0  Oxygen Saturation: <90 <=88 <85 <80 <70  Duration (minutes): Sleep % 41.9 9.4 31.1 12.4 7.0 2.8 4.8 1.1 1.4 0.3     Respiratory Indices      Total Events REM NREM All Night  pRDI:  494  pAHI:  494 ODI:  456  pAHIc:  62  % CSR: 0.0 48.1 48.1 46.2 5.6 70.8 70.8  65.2 9.2 69.1 69.1 63.8 9.0       Pulse Rate Statistics during Sleep (BPM)      Mean: 65 Minimum: N/A Maximum: 127    Indices are calculated using technically valid sleep time of  7 hrs, 8 min. Central-Indices are calculated using technically valid sleep time of  6  hrs, 55 min. pRDI/pAHI are calculated using oxi desaturations ? 3%  Body Position Statistics  Position Supine Prone Right Left Non-Supine  Sleep (min) 209.5 153.5 36.5 46.0 236.0  Sleep % 47.0 34.5 8.2 10.3 53.0  pRDI 83.9 50.6 59.2 73.3 56.3  pAHI 83.9 50.6 59.2 73.3 56.3  ODI 80.0 41.4 64.4 66.5 49.7     Snoring Statistics Snoring Level (dB) >40 >50 >60 >70 >80 >Threshold (45)  Sleep (min) 370.4 249.1 46.9 0.0 0.0 311.6  Sleep % 83.1 55.9 10.5 0.0 0.0 69.9    Mean: 51 dB Sleep Stages Chart  pAHI=69.1                                                                                              Mild              Moderate                    Severe                                                 5              15                    30

## 2019-05-15 NOTE — Telephone Encounter (Signed)
-----   Message from Melvyn Novas, MD sent at 05/15/2019  2:18 PM EDT ----- Severest degree of sleep apnea at AHI of 69.1 /h. loud snoring, over 30 minutes of hypoxia at or below 88% spO2. Supine AHI was 83.9/h.   Recommendations: Immediately starting autotitration CPAP, 6-20 cm water, 3 cm EPR- heated humidity and interface of patient's choice and comfort.   Follow up RV with NP/MD within 90 days of use.     Electronically Signed:  Melvyn Novas, MD     05-14-2019

## 2019-05-15 NOTE — Telephone Encounter (Signed)
I called pt. I advised pt that Dr. Vickey Huger reviewed their sleep study results and found that pt has severe apnea. Dr. Vickey Huger recommends that pt starts auto CPAP. I reviewed PAP compliance expectations with the pt. Pt is agreeable to starting a CPAP. I advised pt that an order will be sent to a DME, [facility], and [facility] will call the pt within about one week after they file with the pt's insurance. [facility] will show the pt how to use the machine, fit for masks, and troubleshoot the CPAP if needed. A follow up appt was made for insurance purposes with Shawnie Dapper, NP on Aug 5,2020 at 3:30 pm. Pt verbalized understanding to arrive 15 minutes early and bring their CPAP. A letter with all of this information in it will be mailed to the pt as a reminder. I verified with the pt that the address we have on file is correct. Pt verbalized understanding of results. Pt had no questions at this time but was encouraged to call back if questions arise. I have sent the order to [facility] and have received confirmation that they have received the order.

## 2019-05-24 ENCOUNTER — Encounter: Payer: Self-pay | Admitting: Family Medicine

## 2019-05-24 ENCOUNTER — Ambulatory Visit: Payer: Self-pay | Admitting: *Deleted

## 2019-05-24 ENCOUNTER — Ambulatory Visit (INDEPENDENT_AMBULATORY_CARE_PROVIDER_SITE_OTHER): Payer: Federal, State, Local not specified - PPO | Admitting: Family Medicine

## 2019-05-24 VITALS — Ht 67.0 in | Wt 260.0 lb

## 2019-05-24 DIAGNOSIS — R200 Anesthesia of skin: Secondary | ICD-10-CM

## 2019-05-24 MED ORDER — DICLOFENAC SODIUM 75 MG PO TBEC: 75.0000 mg | DELAYED_RELEASE_TABLET | Freq: Two times a day (BID) | ORAL | 0 refills | Status: DC

## 2019-05-24 NOTE — Telephone Encounter (Signed)
See note

## 2019-05-24 NOTE — Telephone Encounter (Signed)
Pt had virtual visit today with Dr. [name].

## 2019-05-24 NOTE — Progress Notes (Signed)
    Chief Complaint:  Gerald Palmer is a 43 y.o. male who presents today for a virtual office visit with a chief complaint of numbness.   Assessment/Plan:  Numbness in left leg History consistent with meralgia paresthetica.  No red flags.  Will start course of diclofenac.  He is tolerated Aleve well in the past without reaction.  Discussed importance of avoidance of compression of that area.  Discussed reasons to return to care.  Follow-up as needed.   Subjective:  HPI:  Numbness Located left thigh.  Started 3 days ago.  Stable over that time.  Felt a pulling sensation in the area.  Feels like several small pinpricks to the area as well.  No redness, warmth, or swelling.  No fevers.  No back pain.  No obvious precipitating events.  Recently wore tight fitting belt.  Does not wear a fit cell phone clip or a pager.  No other obvious alleviating or aggravating factors.  No symptoms in right leg.  No weakness.  Located strictly on anterior lateral left thigh.g  ROS: Per HPI  PMH: He reports that he has quit smoking. He has never used smokeless tobacco. He reports current alcohol use. He reports that he does not use drugs.      Objective/Observations  Physical Exam: Gen: NAD, resting comfortably Pulm: Normal work of breathing Neuro: Grossly normal, moves all extremities Psych: Normal affect and thought content  No results found for this or any previous visit (from the past 24 hour(s)).   Virtual Visit via Video   I connected with [name] on 05/24/19 at  2:40 PM EDT by a video enabled telemedicine application and verified that I am speaking with the correct person using two identifiers. I discussed the limitations of evaluation and management by telemedicine and the availability of in person appointments. The patient expressed understanding and agreed to proceed.   Patient location: Home Provider location: [place] [employer] [facility] Persons participating in the virtual visit:  Myself and Patient     Katina Degree. [name], MD 05/24/2019 3:03 PM

## 2019-05-24 NOTE — Telephone Encounter (Signed)
Pt reports numbness and tingling of an area on left thigh, onset Monday. Does not recall any injury. States area is 6-7 in x  4-5 in. States went on long walk yesterday and felt "Pulling." Denies any other symptoms. No redness, warmth or swelling of area, no fever. No back pain. Pts email and ph # verified, call transferred to practice for consideration of appt.  Reason for Disposition . Numbness in a leg or foot (i.e., loss of sensation)  Answer Assessment - Initial Assessment Questions 1. ONSET: "When did the pain start?"      Numbness and tingling 2. LOCATION: "Where is the pain located?"      Left thigh 3. PAIN: "How bad is the pain?"    (Scale 1-10; or mild, moderate, severe)   -  MILD (1-3): doesn't interfere with normal activities    -  MODERATE (4-7): interferes with normal activities (e.g., work or school) or awakens from sleep, limping    -  SEVERE (8-10): excruciating pain, unable to do any normal activities, unable to walk     moderate 4. WORK OR EXERCISE: "Has there been any recent work or exercise that involved this part of the body?"      no 5. CAUSE: "What do you think is causing the leg pain?"     unsure 6. OTHER SYMPTOMS: "Do you have any other symptoms?" (e.g., chest pain, back pain, breathing difficulty, swelling, rash, fever, numbness, weakness)     no  Protocols used: LEG PAIN-A-AH

## 2019-05-28 DIAGNOSIS — G4733 Obstructive sleep apnea (adult) (pediatric): Secondary | ICD-10-CM | POA: Diagnosis not present

## 2019-06-15 ENCOUNTER — Other Ambulatory Visit: Payer: Self-pay | Admitting: Family Medicine

## 2019-06-18 ENCOUNTER — Other Ambulatory Visit: Payer: Self-pay

## 2019-06-18 ENCOUNTER — Encounter: Payer: Self-pay | Admitting: Family Medicine

## 2019-06-18 ENCOUNTER — Ambulatory Visit (INDEPENDENT_AMBULATORY_CARE_PROVIDER_SITE_OTHER): Payer: Federal, State, Local not specified - PPO | Admitting: Family Medicine

## 2019-06-18 VITALS — BP 132/80 | HR 77 | Temp 98.4°F | Resp 16 | Ht 67.0 in | Wt 265.4 lb

## 2019-06-18 DIAGNOSIS — J4540 Moderate persistent asthma, uncomplicated: Secondary | ICD-10-CM | POA: Diagnosis not present

## 2019-06-18 DIAGNOSIS — G5712 Meralgia paresthetica, left lower limb: Secondary | ICD-10-CM | POA: Diagnosis not present

## 2019-06-18 MED ORDER — PEAK FLOW METER DEVI: 0 refills | Status: DC

## 2019-06-18 MED ORDER — GABAPENTIN 100 MG PO CAPS: 100.0000 mg | ORAL_CAPSULE | Freq: Three times a day (TID) | ORAL | 0 refills | Status: DC

## 2019-06-18 NOTE — Patient Instructions (Signed)
Please return in 4 weeks with Dr. Jimmey RalphParker to f/u on asthma and numbness in thigh.   Start gabapentin nightly and get up to 3x/day. We may be able to increase the dose in the next week or two if needed (can take up to 300mg  3x/day.   If you have any questions or concerns, please don't hesitate to send me a message via MyChart or call the office at [phone]. Thank you for visiting with Gerald Palmer today! It's our pleasure caring for you.   Peak Flow Meter  A peak flow meter is a device that helps you determine how well your asthma is being controlled. The device measures the flow of air out of your lungs. This is a simple but important tool in daily asthma management. Peak flow meters are available over the counter. The readings from the meter will help you and your health care provider:  Determine the severity of your asthma.  Evaluate the effectiveness of your current treatment.  Determine when to add or stop certain medicines.  Recognize an asthma attack before signs or symptoms appear.  Decide when to seek emergency care. What are the risks?  Dizziness. Breathing too quickly into the peak flow meter may cause dizziness. This could cause you to pass out. Take your time so you do not get dizzy or light-headed.  False readings. Not cleaning your meter may cause false results. You may not know that your asthma is getting better or getting worse, making you to start or stop treatment improperly. How to find your personal best Your "personal best" is the highest peak flow rate you can reach when you feel good and have no asthma symptoms. This can be used as your standard for comparing your peak flow meter readings. Because everyone's asthma is different, your personal best will be unique to you. Your health care provider will help you to figure out your personal best. Typically, you will take readings once or twice a day for 2 weeks when you are not having symptoms. The highest reading during the  trial period is your personal best. Because your lung function can change over time, your personal best should be measured each year. How to use your peak flow meter 1. Move the upper marker to the number that is your personal best. 2. Move the lower marker to the bottom of the numbered scale. 3. Connect the mouthpiece to the peak flow meter. 4. Stand up. 5. Take a deep breath. Make sure you fill your lungs completely. 6. Place your lips tightly around the mouthpiece. Blow as hard and as fast as you can with a single breath (forced exhalation), as if you are blowing out candles. If your lips are not placed tightly around the mouthpiece of the peak flow meter, you will get incorrect low readings. 7. At the end of your forced exhale, check to see what number the lower marker landed on. This is your peak flow rate. 8. Move the lower marker back to the bottom of the numbered scale. 9. Repeat these steps 2 more times. Record the highest reading of the 3 tries in your asthma diary. Do not calculate the average for your 3 tries, just record the highest. Always write down the results in your asthma diary. After using your peak flow meter, rest and breathe slowly and easily. Keep a record of your progress. Your health care provider can provide you with a simple table to help with this. For the most accurate readings, it is  important to keep your peak flow meter clean. Follow the manufacturer's instructions on how to take care of your peak flow meter. Your health care provider will give you instructions on when to do regular monitoring. You may also need to check your peak flow when:  Coughing, wheezing, or other asthma symptoms wake you up at night.  Your asthma symptoms worsen during the day.  Your breathing is made worse because of a cold, flu, or other respiratory illness.  You need to use your quick-relief, "rescue medicine." It is best to check your peak flow rate before you use rescue medicine, and  then 20-30 minutes afterward. This will help determine whether you need to take the rescue medicine again. How to use your results Use color-coded zones on the meter to see how your peak flow rate compares to your personal best. If your peak flow readings fall too far below your personal best into the yellow or red zone, you will need to take action to prevent or minimize an asthma attack. The color code for each zone reflects progressively more severe symptoms: Green zone: Good   Peak flow rate between 80% and 100% of your personal best. Your asthma is under good control when your peak flow rate is in the green zone.  When you are in the green zone, you are not likely to be experiencing any asthma symptoms.  Only take your regular, preventive medicine. Your rescue medicine is not required. Yellow zone: Caution   Peak flow rate between 50% and 80% of your personal best. Your asthma is getting worse and could be improved.  You may have begun coughing, wheezing, or feeling chest tightness. Sometimes peak flow rates dip down into the yellow zone before asthma symptoms appear.  Consider increasing or changing your asthma medicine. This may include using your rescue medicine. If you have an asthma action plan, follow each step listed for the yellow zone, including medicine changes. Red zone: Warning   Peak flow rate below 50% of your personal best. This may mean you are having, or are at risk of having, a medical emergency.  Your coughing, wheezing, or shortness of breath may have become severe. Do not wait to use your rescue medicine. Stop whatever you are doing and use your rescue inhaler, nebulizer, or other medicines to open your airways.  If you have an asthma action plan, follow each step listed for the red zone, including medicine changes and when to seek emergency medical care. Contact a health care provider if: You are in the yellow zone. If you have an asthma action plan, follow all  of the steps listed under the yellow zone section of the plan. Let your health care provider know that you are in the yellow zone. Get help right away if: You are in the red zone. If you have an asthma action plan, follow all of the steps listed under the red zone section of the plan while you are seeking immediate medical care. Summary  A peak flow meter is a device that helps you determine how well your asthma is being controlled. The device measures the flow of air out of your lungs.  Readings from the meter will help you determine the severity of your asthma, whether your treatment is working, and when to start or stop treatment.  Measure your personal best every year during periods of no symptoms. The meter will compare this reading to your regular readings to determine your condition at a given time.  Work with your health care provider to understand what each zone (green, yellow, red) means and what actions to take in each zone. This information is not intended to replace advice given to you by your health care provider. Make sure you discuss any questions you have with your health care provider. Document Released: 10/03/2007 Document Revised: 11/18/2017 Document Reviewed: 12/02/2016 Elsevier Patient Education  2020 [employer].

## 2019-06-18 NOTE — Progress Notes (Signed)
Subjective  CC:  Chief Complaint  Patient presents with  . Thigh pain    Left side for 4 weeks.. Reports severe pain on the outside of thigh, he spoke with Dr. Jimmey RalphParker about the pain was given Diclofenac with no relief.  . Asthma    SOB, using albuteral and Symbicort and had had to use nebulizer machine this past weekend   Same day acute visit; PCP not available. New pt to me. Chart reviewed.   HPI: Gerald Palmer is a 43 y.o. male who presents to the office today to address the problems listed above in the chief complaint.  43 year old male with prediabetes, hyperlipidemia, obesity with symptoms consistent with meralgia paresthetica evaluated last month.  He returns today because symptoms persist and now with mild pain over his anterior lateral thigh on the left.  He denies weakness in the extremity.  No low back pain.  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  He denies rash.  He is taking Voltaren without any significant pain relief.  History of persistent asthma treated by primary care doctor and Dr. Lucie LeatherKozlow in the past.  I reviewed those notes.  Patient reports that he has felt more short of breath recently.  Reports typically uses albuterol twice a day, in part due to change in breathing from moving from cold to warm environments.  He also takes Symbicort and has a home nebulizer.  However over the last week or 2 he has been using albuterol 7-8 times daily.  He reports he used it an hour before this visit and it did not help him.  He denies recent infection symptoms, specifically no fevers chills sweats congestion or cough.  He denies chest pain.  He was treated for 2 exacerbations in the last 4 months. Assessment  1. Meralgia paresthetica of left side   2. Moderate persistent asthma without complication      Plan   Meralgia paresthetica: Education given.  Progressive symptoms will start gabapentin 100 3 times daily, may need to titrate up to 300 3 times daily.  Discussed appropriate  use and side effects.  Course can be variable.  We will follow-up if not improving or if worsening.  Moderate persistent asthma: Education given.  Currently his respiratory status is normal.  Question subjective symptoms playing a role.  Recommend home peak flow meter for more objective data.  Education on how to use it and how to interpret.  Recommend decreasing albuterol use, continuing Symbicort, and return for follow-up in 4 weeks with a log of his peak flows.  Peak flow are remaining low or if he has increased wheezing, he will follow-up sooner.  May need Dr. Lucie LeatherKozlow in the future but currently he is stable.  Reassured  Follow up: 4 weeks with Dr. Jimmey RalphParker to follow-up on neuralgia paresthetica and asthma Visit date not found  No orders of the defined types were placed in this encounter.  Meds ordered this encounter  Medications  . Peak Flow Meter DEVI    Sig: Use as needed to check your breathing. Log your values    Dispense:  1 each    Refill:  0  . gabapentin (NEURONTIN) 100 MG capsule    Sig: Take 1 capsule (100 mg total) by mouth 3 (three) times daily.    Dispense:  90 capsule    Refill:  0      I reviewed the patients updated PMH, FH, and SocHx.    Patient Active Problem  List   Diagnosis Date Noted  . Severe obstructive sleep apnea-hypopnea syndrome 05/15/2019  . 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 Sleep-disordered breathing 04/08/2019  . Non-restorative sleep 04/08/2019  . Dyslipidemia 08/08/2018  . Hyperglycemia 08/08/2018  . Depression, major, single episode, mild ([place]) 08/07/2018  . Gout 03/21/2018  . Knee pain 12/30/2017  . Allergic rhinitis 11/14/2017  . Asthma 10/11/2017   Current Meds  Medication Sig  . albuterol (VENTOLIN HFA) 108 (90 Base) MCG/ACT inhaler INHALE 2 PUFFS INTO THE LUNGS EVERY 4 TO 6 HOURS AS NEEDED FOR COUGH OR WHEEZING  . allopurinol (ZYLOPRIM) 300 MG tablet Take 1 tablet (300 mg total) by mouth daily.  . budesonide-formoterol  (SYMBICORT) 160-4.5 MCG/ACT inhaler Inhale 2 puffs into the lungs 2 (two) times daily.  Marland Kitchen buPROPion (WELLBUTRIN XL) 150 MG 24 hr tablet Take 1 tablet (150 mg total) by mouth daily.  . cetirizine (ZYRTEC) 10 MG tablet Take 1 tablet (10 mg total) by mouth daily.  . colchicine 0.6 MG tablet Take 1 tablet (0.6 mg total) by mouth daily as needed (gout).  . fluticasone (FLONASE) 50 MCG/ACT nasal spray 1 spray by Each Nare route daily. allergy  . ipratropium-albuterol (DUONEB) 0.5-2.5 (3) MG/3ML SOLN INHALE THE CONTENTS OF ONE VIAL IN NEBULIZER EVERY 4 TO 6 HOURS AS NEEDED FOR COUGH OR WHEEZE  . metFORMIN (GLUCOPHAGE-XR) 750 MG 24 hr tablet Take 1 tablet (750 mg total) by mouth daily with breakfast.  . montelukast (SINGULAIR) 10 MG tablet Take one tablet once daily    Allergies: Patient is allergic to aspirin; ibuprofen; nsaids; and shellfish allergy. Family History: Patient family history includes Arthritis in his maternal grandmother and mother; Asthma in his father; Diabetes in his mother; Hyperlipidemia in his brother, father, and mother; Hypertension in his brother, father, and mother; Stroke in his father. Social History:  Patient  reports that he has quit smoking. He has never used smokeless tobacco. He reports current alcohol use. He reports that he does not use drugs.  Review of Systems: Constitutional: Negative for fever malaise or anorexia Cardiovascular: negative for chest pain Respiratory: negative for SOB or persistent cough Gastrointestinal: negative for abdominal pain  Objective  Vitals: BP 132/80   Pulse 77   Temp 98.4 F (36.9 C) (Oral)   Resp 16   Ht 5\' 7"  (1.702 m)   Wt 265 lb 6.4 oz (120.4 kg)   SpO2 97%   BMI 41.57 kg/m  General: no acute distress , A&Ox3, no respiratory distress, speaking clearly. HEENT: PEERL, conjunctiva normal, Oropharynx moist,neck is supple Cardiovascular:  RRR without murmur or gallop.  Respiratory:  Good breath sounds bilaterally, CTAB with  normal respiratory effort, no wheezing, no rales, no retractions Skin:  Warm, no rashes Left anterior lateral thigh with decreased sensation to light touch when compared to right, normal strength throughout the bilateral lower extremities     Commons side effects, risks, benefits, and alternatives for medications and treatment plan prescribed today were discussed, and the patient expressed understanding of the given instructions. Patient is instructed to call or message via MyChart if he/she has any questions or concerns regarding our treatment plan. No barriers to understanding were identified. We discussed Red Flag symptoms and signs in detail. Patient expressed understanding regarding what to do in case of urgent or emergency type symptoms.   Medication list was reconciled, printed and provided to the patient in AVS. Patient instructions and summary information was reviewed with the patient as documented in the AVS.  This note was prepared with assistance of Systems analyst. Occasional wrong-word or sound-a-like substitutions may have occurred due to the inherent limitations of voice recognition software

## 2019-06-27 DIAGNOSIS — G4733 Obstructive sleep apnea (adult) (pediatric): Secondary | ICD-10-CM | POA: Diagnosis not present

## 2019-07-19 ENCOUNTER — Ambulatory Visit: Payer: Federal, State, Local not specified - PPO | Admitting: Family Medicine

## 2019-07-25 ENCOUNTER — Telehealth: Payer: Self-pay

## 2019-07-25 ENCOUNTER — Ambulatory Visit: Payer: Self-pay | Admitting: Family Medicine

## 2019-07-25 NOTE — Telephone Encounter (Signed)
Patient was a no call/no show for their appointment today.   

## 2019-07-28 DIAGNOSIS — G4733 Obstructive sleep apnea (adult) (pediatric): Secondary | ICD-10-CM | POA: Diagnosis not present

## 2019-08-02 ENCOUNTER — Ambulatory Visit: Payer: Federal, State, Local not specified - PPO | Admitting: Family Medicine

## 2019-08-14 ENCOUNTER — Encounter: Payer: Self-pay | Admitting: Family Medicine

## 2019-08-14 ENCOUNTER — Ambulatory Visit (INDEPENDENT_AMBULATORY_CARE_PROVIDER_SITE_OTHER): Payer: Federal, State, Local not specified - PPO | Admitting: Family Medicine

## 2019-08-14 ENCOUNTER — Other Ambulatory Visit: Payer: Self-pay

## 2019-08-14 VITALS — BP 122/84 | HR 75 | Temp 98.0°F | Ht 67.0 in | Wt 263.5 lb

## 2019-08-14 DIAGNOSIS — F320 Major depressive disorder, single episode, mild: Secondary | ICD-10-CM

## 2019-08-14 DIAGNOSIS — M25512 Pain in left shoulder: Secondary | ICD-10-CM | POA: Diagnosis not present

## 2019-08-14 DIAGNOSIS — G5603 Carpal tunnel syndrome, bilateral upper limbs: Secondary | ICD-10-CM | POA: Diagnosis not present

## 2019-08-14 DIAGNOSIS — G5712 Meralgia paresthetica, left lower limb: Secondary | ICD-10-CM

## 2019-08-14 DIAGNOSIS — Z6841 Body Mass Index (BMI) 40.0 and over, adult: Secondary | ICD-10-CM

## 2019-08-14 MED ORDER — DICLOFENAC SODIUM 75 MG PO TBEC: 75.0000 mg | DELAYED_RELEASE_TABLET | Freq: Two times a day (BID) | ORAL | 0 refills | Status: DC

## 2019-08-14 MED ORDER — BUPROPION HCL ER (XL) 300 MG PO TB24: 300.0000 mg | ORAL_TABLET | Freq: Every day | ORAL | 3 refills | Status: DC

## 2019-08-14 NOTE — Patient Instructions (Signed)
It was very nice to see you today!  Please try the diclofenac for another few weeks.   You can use Voltaren for your hands.  Please increase your Wellbutrin 300 mg daily.  Come back to see me in a month or 2 for your physical with blood work, or sooner if needed.  Take care, Dr Jerline Pain  Please try these tips to maintain a healthy lifestyle:   Eat at least 3 REAL meals and 1-2 snacks per day.  Aim for no more than 5 hours between eating.  If you eat breakfast, please do so within one hour of getting up.    Obtain twice as many fruits/vegetables as protein or carbohydrate foods for both lunch and dinner. (Half of each meal should be fruits/vegetables, one quarter protein, and one quarter starchy carbs)   Cut down on sweet beverages. This includes juice, soda, and sweet tea.    Exercise at least 150 minutes every week.

## 2019-08-14 NOTE — Assessment & Plan Note (Signed)
Increase Wellbutrin to 300 mg daily. Follow up with me in a month or 2 for CPE.

## 2019-08-14 NOTE — Progress Notes (Signed)
   Chief Complaint:  Gerald Palmer is a 43 y.o. male who presents today with a chief complaint of meralgia paresthetic follow up.   Assessment/Plan:  Meralgia Paresthetica  Improving with diclofenac.  Will extend for another few weeks.  May need referral to neurology if continues to have problems.  Left shoulder pain Crepitus with popping and catching raises concern for possible labral tear.  May also have underlying osteoarthritis.  Will start diclofenac as noted above.  If no improvement will need referral to sports medicine.  Carpal tunnel syndrome, bilateral Discussed options including referral for injections versus surgery.  He does not wish to have either of these done at this time.  Will start diclofenac as noted above.  Also recommend over-the-counter Voltaren.  Depression, major, single episode, mild ([facility]) Increase Wellbutrin to 300 mg daily. Follow up with me in a month or 2 for CPE.  Body mass index is 41.27 kg/m. / Obese BMI Metric Follow Up - 08/14/19 0831      BMI Metric Follow Up-Please document annually   BMI Metric Follow Up  Education provided         Subjective:  HPI:  Meralgia Paresthetica Patient seen couple months ago for this.  Was started on diclofenac.  Symptoms are 60 to 70% better.  Still has unusual sensation in the distal aspect of his left upper thigh.  No longer has pain when walking.  He is also having some left shoulder pain.  This started a few months ago.  States that this flares up every 2 to 3 years.  Located on top of his left shoulder and radiates into his left arm.  Occurs randomly.  Worse with certain motions.  No specific treatments tried.  No history of trauma or other obvious precipitating events.  No specific treatments tried  Depression On Wellbutrin 150 mg daily.  Symptoms are moderately well controlled on this.  He is interested in increasing the dose.  Carpal tunnel syndrome Chronic problem.  Present for several years.  Worsened  recently.  Has tried several treatments in the past which have not worked particularly well.  ROS: Per HPI  PMH: He reports that he has quit smoking. He has never used smokeless tobacco. He reports current alcohol use. He reports that he does not use drugs.      Objective:  Physical Exam: BP 122/84   Pulse 75   Temp 98 F (36.7 C)   Ht 5\' 7"  (1.702 m)   Wt 263 lb 8 oz (119.5 kg)   SpO2 95%   BMI 41.27 kg/m   Wt Readings from Last 3 Encounters:  08/14/19 263 lb 8 oz (119.5 kg)  06/18/19 265 lb 6.4 oz (120.4 kg)  05/24/19 260 lb (117.9 kg)  Gen: NAD, resting comfortably CV: Regular rate and rhythm with no murmurs appreciated Pulm: Normal work of breathing, clear to auscultation bilaterally with no crackles, wheezes, or rhonchi MSK: No edema, cyanosis, or clubbing noted.  -Left shoulder: No deformities.  Tender to palpation along anterior and superior aspect.  Crepitus noted with active range of motion.  Positive Neer and Hawkins test. Skin: Warm, dry Neuro: Grossly normal, moves all extremities Psych: Normal affect and thought content       M. Jerline Pain, MD 08/14/2019 9:05 AM

## 2019-08-14 NOTE — Assessment & Plan Note (Signed)
Discussed options including referral for injections versus surgery.  He does not wish to have either of these done at this time.  Will start diclofenac as noted above.  Also recommend over-the-counter Voltaren.

## 2019-08-28 DIAGNOSIS — G4733 Obstructive sleep apnea (adult) (pediatric): Secondary | ICD-10-CM | POA: Diagnosis not present

## 2019-09-22 ENCOUNTER — Other Ambulatory Visit: Payer: Self-pay | Admitting: Family Medicine

## 2019-09-24 ENCOUNTER — Encounter: Payer: Self-pay | Admitting: Family Medicine

## 2019-09-24 NOTE — Telephone Encounter (Signed)
Dr. Parker patient 

## 2019-09-25 ENCOUNTER — Ambulatory Visit: Payer: Self-pay | Admitting: *Deleted

## 2019-09-25 NOTE — Telephone Encounter (Signed)
Pt called with some shortness of breath related to his asthma. He stated the change in the weather from hot to cold always makes his asthma start. He has been increasing his use of his nebulizer. He stated that he is always put on prednisone when this starts. This started about 4 or 5 days ago.  Advised to go to an urgent care per protocol. He does not want to go to the ones close to him and is asking for a call back in lthe morning.  If he gets worst then he will go to an urgent care. Routing to LB at [facility] for review.    Reason for Disposition . [1] MODERATE longstanding difficulty breathing (e.g., speaks in phrases, SOB even at rest, pulse 100-120) AND [2] SAME as normal  Answer Assessment - Initial Assessment Questions 1. RESPIRATORY STATUS: "Describe your breathing?" (e.g., wheezing, shortness of breath, unable to speak, severe coughing)      Some shortness of breath 2. ONSET: "When did this breathing problem begin?"      About 4 or 5 days ago 3. PATTERN "Does the difficult breathing come and go, or has it been constant since it started?"      constant 4. SEVERITY: "How bad is your breathing?" (e.g., mild, moderate, severe)    - MILD: No SOB at rest, mild SOB with walking, speaks normally in sentences, can lay down, no retractions, pulse < 100.    - MODERATE: SOB at rest, SOB with minimal exertion and prefers to sit, cannot lie down flat, speaks in phrases, mild retractions, audible wheezing, pulse 100-120.    - SEVERE: Very SOB at rest, speaks in single words, struggling to breathe, sitting hunched forward, retractions, pulse > 120      moderate 5. RECURRENT SYMPTOM: "Have you had difficulty breathing before?" If so, ask: "When was the last time?" and "What happened that time?"      Yes, has asthma 6. CARDIAC HISTORY: "Do you have any history of heart disease?" (e.g., heart attack, angina, bypass surgery, angioplasty)      no 7. LUNG HISTORY: "Do you have any history of lung  disease?"  (e.g., pulmonary embolus, asthma, emphysema)     asthma 8. CAUSE: "What do you think is causing the breathing problem?"      Change in the weather, going from hot to cold 9. OTHER SYMPTOMS: "Do you have any other symptoms? (e.g., dizziness, runny nose, cough, chest pain, fever)     Chest pressure, squeezing  10. PREGNANCY: "Is there any chance you are pregnant?" "When was your last menstrual period?"       no 11. TRAVEL: "Have you traveled out of the country in the last month?" (e.g., travel history, exposures)       no  Protocols used: BREATHING DIFFICULTY-A-AH

## 2019-09-26 NOTE — Telephone Encounter (Signed)
Called pt to schedule virtual visit, no answer, VM full- unable to leave message.

## 2019-09-26 NOTE — Telephone Encounter (Signed)
Please cal to schedule a virtual visit

## 2019-09-26 NOTE — Telephone Encounter (Signed)
Called pt multiple times to schedule appt, no answer and unable to LVM.

## 2019-09-26 NOTE — Telephone Encounter (Signed)
See note

## 2019-10-05 ENCOUNTER — Encounter: Payer: Self-pay | Admitting: Family Medicine

## 2019-10-05 ENCOUNTER — Ambulatory Visit (INDEPENDENT_AMBULATORY_CARE_PROVIDER_SITE_OTHER): Payer: Federal, State, Local not specified - PPO | Admitting: Family Medicine

## 2019-10-05 ENCOUNTER — Other Ambulatory Visit: Payer: Self-pay

## 2019-10-05 VITALS — BP 134/80 | HR 60 | Temp 97.7°F | Ht 67.0 in | Wt 269.0 lb

## 2019-10-05 DIAGNOSIS — F320 Major depressive disorder, single episode, mild: Secondary | ICD-10-CM

## 2019-10-05 DIAGNOSIS — Z23 Encounter for immunization: Secondary | ICD-10-CM

## 2019-10-05 DIAGNOSIS — Z0001 Encounter for general adult medical examination with abnormal findings: Secondary | ICD-10-CM

## 2019-10-05 DIAGNOSIS — E785 Hyperlipidemia, unspecified: Secondary | ICD-10-CM | POA: Diagnosis not present

## 2019-10-05 DIAGNOSIS — J45901 Unspecified asthma with (acute) exacerbation: Secondary | ICD-10-CM

## 2019-10-05 DIAGNOSIS — R739 Hyperglycemia, unspecified: Secondary | ICD-10-CM

## 2019-10-05 DIAGNOSIS — M1000 Idiopathic gout, unspecified site: Secondary | ICD-10-CM

## 2019-10-05 DIAGNOSIS — J4540 Moderate persistent asthma, uncomplicated: Secondary | ICD-10-CM | POA: Diagnosis not present

## 2019-10-05 DIAGNOSIS — J3089 Other allergic rhinitis: Secondary | ICD-10-CM

## 2019-10-05 DIAGNOSIS — G5710 Meralgia paresthetica, unspecified lower limb: Secondary | ICD-10-CM

## 2019-10-05 LAB — COMPREHENSIVE METABOLIC PANEL
ALT: 38 U/L (ref 0–53)
AST: 24 U/L (ref 0–37)
Albumin: 4.3 g/dL (ref 3.5–5.2)
Alkaline Phosphatase: 85 U/L (ref 39–117)
BUN: 14 mg/dL (ref 6–23)
CO2: 26 mEq/L (ref 19–32)
Calcium: 9 mg/dL (ref 8.4–10.5)
Chloride: 106 mEq/L (ref 96–112)
Creatinine, Ser: 1.45 mg/dL (ref 0.40–1.50)
GFR: 64.72 mL/min (ref >60.00)
Glucose, Bld: 90 mg/dL (ref 70–99)
Potassium: 3.9 mEq/L (ref 3.5–5.1)
Sodium: 142 mEq/L (ref 135–145)
Total Bilirubin: 0.3 mg/dL (ref 0.2–1.2)
Total Protein: 6.5 g/dL (ref 6.0–8.3)

## 2019-10-05 LAB — LIPID PANEL
Cholesterol: 201 mg/dL — ABNORMAL HIGH (ref 0–200)
HDL: 27.1 mg/dL — ABNORMAL LOW (ref >39.00)
NonHDL: 174.02
Total CHOL/HDL Ratio: 7
Triglycerides: 386 mg/dL — ABNORMAL HIGH (ref 0.0–149.0)
VLDL: 77.2 mg/dL — ABNORMAL HIGH (ref 0.0–40.0)

## 2019-10-05 LAB — CBC
HCT: 42.3 % (ref 39.0–52.0)
Hemoglobin: 14 g/dL (ref 13.0–17.0)
MCHC: 33.2 g/dL (ref 30.0–36.0)
MCV: 84.4 fl (ref 78.0–100.0)
Platelets: 375 10*3/uL (ref 150.0–400.0)
RBC: 5.01 Mil/uL (ref 4.22–5.81)
RDW: 14.6 % (ref 11.5–15.5)
WBC: 8.4 10*3/uL (ref 4.0–10.5)

## 2019-10-05 LAB — URIC ACID: Uric Acid, Serum: 11.5 mg/dL — ABNORMAL HIGH (ref 4.0–7.8)

## 2019-10-05 LAB — HEMOGLOBIN A1C: Hgb A1c MFr Bld: 6.6 % — ABNORMAL HIGH (ref 4.6–6.5)

## 2019-10-05 LAB — LDL CHOLESTEROL, DIRECT: Direct LDL: 103 mg/dL

## 2019-10-05 LAB — TSH: TSH: 2.92 u[IU]/mL (ref 0.35–4.50)

## 2019-10-05 MED ORDER — BUDESONIDE-FORMOTEROL FUMARATE 160-4.5 MCG/ACT IN AERO: 2.0000 | INHALATION_SPRAY | Freq: Two times a day (BID) | RESPIRATORY_TRACT | 5 refills | Status: DC

## 2019-10-05 MED ORDER — GABAPENTIN 100 MG PO CAPS: 100.0000 mg | ORAL_CAPSULE | Freq: Three times a day (TID) | ORAL | 3 refills | Status: DC

## 2019-10-05 MED ORDER — METFORMIN HCL ER 750 MG PO TB24: 750.0000 mg | ORAL_TABLET | Freq: Every day | ORAL | 3 refills | Status: DC

## 2019-10-05 MED ORDER — PROBENECID 500 MG PO TABS: 250.0000 mg | ORAL_TABLET | Freq: Two times a day (BID) | ORAL | 0 refills | Status: DC

## 2019-10-05 MED ORDER — PREDNISONE 50 MG PO TABS: ORAL_TABLET | ORAL | 0 refills | Status: DC

## 2019-10-05 MED ORDER — ALLOPURINOL 300 MG PO TABS: 300.0000 mg | ORAL_TABLET | Freq: Every day | ORAL | 3 refills | Status: DC

## 2019-10-05 NOTE — Assessment & Plan Note (Signed)
Continue Wellbutrin 300 mg daily.  He will follow-up with behavioral specialist.

## 2019-10-05 NOTE — Patient Instructions (Addendum)
It was very nice to see you today!  I will send in your medications.  Also send in a few days of prednisone.  We will check blood work today.  Please come back in 1 year for your next physical, or sooner if needed.  Take care, Dr Jerline Pain  Please try these tips to maintain a healthy lifestyle:   Eat at least 3 REAL meals and 1-2 snacks per day.  Aim for no more than 5 hours between eating.  If you eat breakfast, please do so within one hour of getting up.    Obtain twice as many fruits/vegetables as protein or carbohydrate foods for both lunch and dinner. (Half of each meal should be fruits/vegetables, one quarter protein, and one quarter starchy carbs)   Cut down on sweet beverages. This includes juice, soda, and sweet tea.    Exercise at least 150 minutes every week.    Preventive Care 41-52 Years Old, Male Preventive care refers to lifestyle choices and visits with your health care provider that can promote health and wellness. This includes:  A yearly physical exam. This is also called an annual well check.  Regular dental and eye exams.  Immunizations.  Screening for certain conditions.  Healthy lifestyle choices, such as eating a healthy diet, getting regular exercise, not using drugs or products that contain nicotine and tobacco, and limiting alcohol use. What can I expect for my preventive care visit? Physical exam Your health care provider will check:  Height and weight. These may be used to calculate body mass index (BMI), which is a measurement that tells if you are at a healthy weight.  Heart rate and blood pressure.  Your skin for abnormal spots. Counseling Your health care provider may ask you questions about:  Alcohol, tobacco, and drug use.  Emotional well-being.  Home and relationship well-being.  Sexual activity.  Eating habits.  Work and work Statistician. What immunizations do I need?  Influenza (flu) vaccine  This is recommended every  year. Tetanus, diphtheria, and pertussis (Tdap) vaccine  You may need a Td booster every 10 years. Varicella (chickenpox) vaccine  You may need this vaccine if you have not already been vaccinated. Zoster (shingles) vaccine  You may need this after age 34. Measles, mumps, and rubella (MMR) vaccine  You may need at least one dose of MMR if you were born in 1957 or later. You may also need a second dose. Pneumococcal conjugate (PCV13) vaccine  You may need this if you have certain conditions and were not previously vaccinated. Pneumococcal polysaccharide (PPSV23) vaccine  You may need one or two doses if you smoke cigarettes or if you have certain conditions. Meningococcal conjugate (MenACWY) vaccine  You may need this if you have certain conditions. Hepatitis A vaccine  You may need this if you have certain conditions or if you travel or work in places where you may be exposed to hepatitis A. Hepatitis B vaccine  You may need this if you have certain conditions or if you travel or work in places where you may be exposed to hepatitis B. Haemophilus influenzae type b (Hib) vaccine  You may need this if you have certain risk factors. Human papillomavirus (HPV) vaccine  If recommended by your health care provider, you may need three doses over 6 months. You may receive vaccines as individual doses or as more than one vaccine together in one shot (combination vaccines). Talk with your health care provider about the risks and benefits of  combination vaccines. What tests do I need? Blood tests  Lipid and cholesterol levels. These may be checked every 5 years, or more frequently if you are over 22 years old.  Hepatitis C test.  Hepatitis B test. Screening  Lung cancer screening. You may have this screening every year starting at age 38 if you have a 30-pack-year history of smoking and currently smoke or have quit within the past 15 years.  Prostate cancer screening.  Recommendations will vary depending on your family history and other risks.  Colorectal cancer screening. All adults should have this screening starting at age 8 and continuing until age 68. Your health care provider may recommend screening at age 55 if you are at increased risk. You will have tests every 1-10 years, depending on your results and the type of screening test.  Diabetes screening. This is done by checking your blood sugar (glucose) after you have not eaten for a while (fasting). You may have this done every 1-3 years.  Sexually transmitted disease (STD) testing. Follow these instructions at home: Eating and drinking  Eat a diet that includes fresh fruits and vegetables, whole grains, lean protein, and low-fat dairy products.  Take vitamin and mineral supplements as recommended by your health care provider.  Do not drink alcohol if your health care provider tells you not to drink.  If you drink alcohol: ? Limit how much you have to 0-2 drinks a day. ? Be aware of how much alcohol is in your drink. In the [place] one drink equals one 12 oz bottle of beer (355 mL), one 5 oz glass of wine (148 mL), or one 1 oz glass of hard liquor (44 mL). Lifestyle  Take daily care of your teeth and gums.  Stay active. Exercise for at least 30 minutes on 5 or more days each week.  Do not use any products that contain nicotine or tobacco, such as cigarettes, e-cigarettes, and chewing tobacco. If you need help quitting, ask your health care provider.  If you are sexually active, practice safe sex. Use a condom or other form of protection to prevent STIs (sexually transmitted infections).  Talk with your health care provider about taking a low-dose aspirin every day starting at age 54. What's next?  Go to your health care provider once a year for a well check visit.  Ask your health care provider how often you should have your eyes and teeth checked.  Stay up to date on all vaccines. This  information is not intended to replace advice given to you by your health care provider. Make sure you discuss any questions you have with your health care provider. Document Released: 01/02/2016 Document Revised: 11/30/2018 Document Reviewed: 11/30/2018 Elsevier Patient Education  2020 [employer].

## 2019-10-05 NOTE — Assessment & Plan Note (Signed)
Exam consistent with mild flare today.  We will continue current doses of Symbicort and albuterol as needed.  We will also send in a "pocket prescription" for prednisone in case symptoms do not improve over the next few days.

## 2019-10-05 NOTE — Progress Notes (Signed)
Please inform patient of the following:  His gout numbers are high - I would like to start an additional medication to lower number and reduce severity and frequency of flares. Please send in probenecid 250mg  twice daily if he wishes to start.   His blood sugar is stable but elevated. Would like to increase his metformin to 500mg  twice daily. Please send in for patient.  Cholesterol levels are borderline - do not need to start medications but she should continue to work on diet and exercise and we can recheck in a year.  Everything else is NORMAL.  Gerald Palmer. Jerline Pain, MD 10/05/2019 3:55 PM

## 2019-10-05 NOTE — Assessment & Plan Note (Signed)
Continue Singulair 10 mg daily and Zyrtec 10 mg daily.

## 2019-10-05 NOTE — Assessment & Plan Note (Signed)
Check lipid panel  

## 2019-10-05 NOTE — Progress Notes (Signed)
Chief Complaint:  Gerald LemmingsSheldon Palmer is a 43 y.o. male who presents today for his annual comprehensive physical exam.    Assessment/Plan:  Hyperglycemia Continue metformin 750 mg daily.  Check A1c today.  Dyslipidemia Check lipid panel.  Depression, major, single episode, mild (HCC) Continue Wellbutrin 300 mg daily.  He will follow-up with behavioral specialist.  Gout Check uric acid level.  Continue allopurinol 300 mg daily and colchicine as needed for flares.  Allergic rhinitis Continue Singulair 10 mg daily and Zyrtec 10 mg daily.  Asthma Exam consistent with mild flare today.  We will continue current doses of Symbicort and albuterol as needed.  We will also send in a "pocket prescription" for prednisone in case symptoms do not improve over the next few days.  Meralgia paresthetica Continue gabapentin 100 mg 3 times daily.  Preventative Healthcare: Flu vaccine and Tdap given today.  Check CBC CMET TSH and lipid panel.  Patient Counseling(The following topics were reviewed and/or handout was given):  -Nutrition: Stressed importance of moderation in sodium/caffeine intake, saturated fat and cholesterol, caloric balance, sufficient intake of fresh fruits, vegetables, and fiber.  -Stressed the importance of regular exercise.   -Substance Abuse: Discussed cessation/primary prevention of tobacco, alcohol, or other drug use; driving or other dangerous activities under the influence; availability of treatment for abuse.   -Injury prevention: Discussed safety belts, safety helmets, smoke detector, smoking near bedding or upholstery.   -Sexuality: Discussed sexually transmitted diseases, partner selection, use of condoms, avoidance of unintended pregnancy and contraceptive alternatives.   -Dental health: Discussed importance of regular tooth brushing, flossing, and dental visits.  -Health maintenance and immunizations reviewed. Please refer to Health maintenance section.  Return to  care in 1 year for next preventative visit.     Subjective:  HPI:  He has no acute complaints today.   He has been having a little bit more shortness of breath and chest tightness for last couple of days.  Thinks this is due to mild asthma flare related to his allergies.  His stable, chronic medical conditions are outlined below:   #Asthma / Allergies  - On Symbicort 160-4.5 2 puffs twice daily. -Uses albuterol and DuoNeb's as needed for flares in exacerbation. -On Zyrtec 10 mg daily and Singulair 10 mg daily.  Tolerating both well.  # Hyperglycemia -On metformin 750 mg daily.  Tolerating well. - ROS: No reported polyuria or polydipsia.  # Gout - On allopurinol 300 mg daily - Uses colchicine and voltaren as needed for flares. - No recent flares  # Depression -On Wellbutrin 300 mg daily.  Tolerating well. - ROS: No reported SI or HI  # Meralgia Paresthetica - On gabapentin 100mg  three times daily and tolerating well  Lifestyle Diet: No diets or eating plans.  Exercise: Likes to walk around neighborhood.   Depression screen PHQ 2/9 10/05/2019  Decreased Interest 1  Down, Depressed, Hopeless 1  PHQ - 2 Score 2  Altered sleeping 2  Tired, decreased energy 1  Change in appetite 2  Feeling bad or failure about yourself  1  Trouble concentrating 0  Moving slowly or fidgety/restless 0  Suicidal thoughts 0  PHQ-9 Score 8  Difficult doing work/chores Somewhat difficult    Health Maintenance Due  Topic Date Due  . INFLUENZA VACCINE  07/21/2019     ROS: Per HPI, otherwise a complete review of systems was negative.   PMH:  The following were reviewed and entered/updated in epic: Past Medical History:  Diagnosis Date  .  Asthma   . Hypertension    Patient Active Problem List   Diagnosis Date Noted  . Meralgia paresthetica 10/05/2019  . Carpal tunnel syndrome, bilateral 08/14/2019  . Severe obstructive sleep apnea-hypopnea syndrome 05/15/2019  . Dyslipidemia  08/08/2018  . Hyperglycemia 08/08/2018  . Depression, major, single episode, mild ([place]) 08/07/2018  . Gout 03/21/2018  . Knee pain 12/30/2017  . Allergic rhinitis 11/14/2017  . Asthma 10/11/2017   Past Surgical History:  Procedure Laterality Date  . ACHILLES TENDON SURGERY    . ANKLE FRACTURE SURGERY Right   . TONSILLECTOMY      Family History  Problem Relation Age of Onset  . Hypertension Mother   . Arthritis Mother   . Diabetes Mother   . Hyperlipidemia Mother   . Asthma Father   . Hyperlipidemia Father   . Hypertension Father   . Stroke Father   . Hypertension Brother   . Hyperlipidemia Brother   . Arthritis Maternal Grandmother     Medications- reviewed and updated Current Outpatient Medications  Medication Sig Dispense Refill  . albuterol (VENTOLIN HFA) 108 (90 Base) MCG/ACT inhaler INHALE 2 PUFFS INTO THE LUNGS EVERY 4 TO 6 HOURS AS NEEDED FOR COUGH OR WHEEZING 42.5 g 0  . allopurinol (ZYLOPRIM) 300 MG tablet Take 1 tablet (300 mg total) by mouth daily. 90 tablet 3  . budesonide-formoterol (SYMBICORT) 160-4.5 MCG/ACT inhaler Inhale 2 puffs into the lungs 2 (two) times daily. 1 Inhaler 5  . buPROPion (WELLBUTRIN XL) 300 MG 24 hr tablet Take 1 tablet (300 mg total) by mouth daily. 90 tablet 3  . cetirizine (ZYRTEC) 10 MG tablet Take 1 tablet (10 mg total) by mouth daily. 30 tablet 11  . colchicine 0.6 MG tablet Take 1 tablet (0.6 mg total) by mouth daily as needed (gout). 30 tablet 5  . diclofenac (VOLTAREN) 75 MG EC tablet Take 1 tablet (75 mg total) by mouth 2 (two) times daily. 30 tablet 0  . fluticasone (FLONASE) 50 MCG/ACT nasal spray 1 spray by Each Nare route daily. allergy    . gabapentin (NEURONTIN) 100 MG capsule Take 1 capsule (100 mg total) by mouth 3 (three) times daily. 270 capsule 3  . ipratropium-albuterol (DUONEB) 0.5-2.5 (3) MG/3ML SOLN INHALE THE CONTENTS OF ONE VIAL IN NEBULIZER EVERY 4 TO 6 HOURS AS NEEDED FOR COUGH OR WHEEZE 540 mL 1  . metFORMIN  (GLUCOPHAGE-XR) 750 MG 24 hr tablet Take 1 tablet (750 mg total) by mouth daily with breakfast. 90 tablet 3  . montelukast (SINGULAIR) 10 MG tablet Take one tablet once daily 90 tablet 3  . Peak Flow Meter DEVI Use as needed to check your breathing. Log your values 1 each 0  . predniSONE (DELTASONE) 50 MG tablet Take 1 tablet daily for 5 days. 5 tablet 0   No current facility-administered medications for this visit.     Allergies-reviewed and updated Allergies  Allergen Reactions  . Aspirin     unknown  . Ibuprofen Hives    High doses cause hives  . Nsaids Hives  . Shellfish Allergy Itching    Eyes swell    Social History   Socioeconomic History  . Marital status: Single    Spouse name: Not on file  . Number of children: Not on file  . Years of education: Not on file  . Highest education level: Not on file  Occupational History  . Not on file  Social Needs  . Financial resource strain: Not on file  .  Food insecurity    Worry: Not on file    Inability: Not on file  . Transportation needs    Medical: Not on file    Non-medical: Not on file  Tobacco Use  . Smoking status: Former Games developer  . Smokeless tobacco: Never Used  Substance and Sexual Activity  . Alcohol use: Yes    Comment: occasionally  . Drug use: No  . Sexual activity: Yes  Lifestyle  . Physical activity    Days per week: Not on file    Minutes per session: Not on file  . Stress: Not on file  Relationships  . Social Musician on phone: Not on file    Gets together: Not on file    Attends religious service: Not on file    Active member of club or organization: Not on file    Attends meetings of clubs or organizations: Not on file    Relationship status: Not on file  Other Topics Concern  . Not on file  Social History Narrative  . Not on file        Objective:  Physical Exam: BP 134/80   Pulse 60   Temp 97.7 F (36.5 C)   Ht 5\' 7"  (1.702 m)   Wt 269 lb (122 kg)   SpO2 96%   BMI  42.13 kg/m   Body mass index is 42.13 kg/m. Wt Readings from Last 3 Encounters:  10/05/19 269 lb (122 kg)  08/14/19 263 lb 8 oz (119.5 kg)  06/18/19 265 lb 6.4 oz (120.4 kg)   Gen: NAD, resting comfortably HEENT: TMs normal bilaterally. OP clear. No thyromegaly noted.  CV: RRR with no murmurs appreciated Pulm: NWOB, CTAB with no crackles, wheezes, or rhonchi. Mildly diminish  GI: Normal bowel sounds present. Soft, Nontender, Nondistended. MSK: no edema, cyanosis, or clubbing noted Skin: warm, dry Neuro: CN2-12 grossly intact. Strength 5/5 in upper and lower extremities. Reflexes symmetric and intact bilaterally.  Psych: Normal affect and thought content     Mariadelcarmen Corella M. 06/20/19, MD 10/05/2019 10:46 AM

## 2019-10-05 NOTE — Assessment & Plan Note (Signed)
Continue metformin 750 mg daily.  Check A1c today.

## 2019-10-05 NOTE — Telephone Encounter (Signed)
Copied from [place] [id number]. Topic: General - Other >> Oct 05, 2019 11:03 AM Gerald Palmer wrote: Reason for CRM: Pt had a visit today and left the office without getting a note for work today/ Pt would like a note for missing work today due to his appt. Please advise Pt when note can be picked up today

## 2019-10-05 NOTE — Assessment & Plan Note (Signed)
Check uric acid level.  Continue allopurinol 300 mg daily and colchicine as needed for flares.

## 2019-10-05 NOTE — Assessment & Plan Note (Signed)
Continue gabapentin 100 mg 3 times daily. 

## 2019-10-29 ENCOUNTER — Encounter: Payer: Self-pay | Admitting: Family Medicine

## 2019-11-30 ENCOUNTER — Other Ambulatory Visit: Payer: Self-pay | Admitting: Family Medicine

## 2019-12-08 DIAGNOSIS — Z20828 Contact with and (suspected) exposure to other viral communicable diseases: Secondary | ICD-10-CM | POA: Diagnosis not present

## 2019-12-22 ENCOUNTER — Other Ambulatory Visit: Payer: Self-pay | Admitting: Allergy and Immunology

## 2019-12-24 ENCOUNTER — Encounter: Payer: Self-pay | Admitting: Family Medicine

## 2019-12-26 ENCOUNTER — Other Ambulatory Visit: Payer: Self-pay

## 2019-12-26 MED ORDER — COLCHICINE 0.6 MG PO TABS: 0.6000 mg | ORAL_TABLET | Freq: Every day | ORAL | 5 refills | Status: DC | PRN

## 2019-12-26 NOTE — Telephone Encounter (Signed)
Dr. [name], okay to refill Rx's requested?

## 2020-01-04 ENCOUNTER — Telehealth: Payer: Self-pay | Admitting: Family Medicine

## 2020-01-04 ENCOUNTER — Other Ambulatory Visit: Payer: Self-pay

## 2020-01-04 MED ORDER — ALBUTEROL SULFATE HFA 108 (90 BASE) MCG/ACT IN AERS: INHALATION_SPRAY | RESPIRATORY_TRACT | 2 refills | Status: DC

## 2020-01-04 NOTE — Telephone Encounter (Signed)
Pt calling to follow up on refill for albuterol inhaler. Please advise.

## 2020-02-06 ENCOUNTER — Ambulatory Visit (INDEPENDENT_AMBULATORY_CARE_PROVIDER_SITE_OTHER): Payer: Federal, State, Local not specified - PPO | Admitting: Family Medicine

## 2020-02-06 ENCOUNTER — Encounter: Payer: Self-pay | Admitting: Family Medicine

## 2020-02-06 VITALS — BP 117/89 | Ht 67.0 in | Wt 269.0 lb

## 2020-02-06 DIAGNOSIS — J45901 Unspecified asthma with (acute) exacerbation: Secondary | ICD-10-CM | POA: Diagnosis not present

## 2020-02-06 DIAGNOSIS — J4540 Moderate persistent asthma, uncomplicated: Secondary | ICD-10-CM | POA: Diagnosis not present

## 2020-02-06 DIAGNOSIS — R739 Hyperglycemia, unspecified: Secondary | ICD-10-CM

## 2020-02-06 MED ORDER — CLINDAMYCIN HCL 300 MG PO CAPS: 300.0000 mg | ORAL_CAPSULE | Freq: Three times a day (TID) | ORAL | 0 refills | Status: AC

## 2020-02-06 MED ORDER — BUDESONIDE-FORMOTEROL FUMARATE 160-4.5 MCG/ACT IN AERO: 2.0000 | INHALATION_SPRAY | Freq: Two times a day (BID) | RESPIRATORY_TRACT | 5 refills | Status: DC

## 2020-02-06 MED ORDER — AZELASTINE HCL 0.1 % NA SOLN: 2.0000 | Freq: Two times a day (BID) | NASAL | 12 refills | Status: DC

## 2020-02-06 MED ORDER — ALBUTEROL SULFATE HFA 108 (90 BASE) MCG/ACT IN AERS: INHALATION_SPRAY | RESPIRATORY_TRACT | 2 refills | Status: DC

## 2020-02-06 MED ORDER — PREDNISONE 50 MG PO TABS: ORAL_TABLET | ORAL | 0 refills | Status: DC

## 2020-02-06 NOTE — Progress Notes (Signed)
   Gerald Palmer is a 44 y.o. male who presents today for a virtual office visit.  Assessment/Plan:  New/Acute Problems: Sinusitis Start clindamycin, prednisone burst, and Astelin nasal spray.  Can continue using over-the-counter meds if needed.  Encourage good oral hydration.  Chronic Problems Addressed Today: Asthma Well-controlled.  Will refill Symbicort and albuterol.  Hyperglycemia Will be careful to watch for hypoglycemic signs while on prednisone.  He will continue Metformin 750 mg daily.  Needs A1c with next blood draw.     Subjective:  HPI:  Patient symptoms started about a week ago.  Tried taking over-the-counter meds with no improvement.  Has quite a bit of nasal congestion sinus pressure.  Feels like past sinus infections.  No fevers or chills.  No shortness of breath.  Has had clindamycin in the past which worked well for sinus infection.  Did not tolerate Augmentin or doxycycline in the past.       Objective/Observations  Physical Exam: Gen: NAD, resting comfortably Pulm: Normal work of breathing Neuro: Grossly normal, moves all extremities Psych: Normal affect and thought content  Virtual Visit via Video   I connected with Gerald Palmer on 02/06/20 at  8:00 AM EST by a video enabled telemedicine application and verified that I am speaking with the correct person using two identifiers. The limitations of evaluation and management by telemedicine and the availability of in person appointments were discussed. The patient expressed understanding and agreed to proceed.   Patient location: Home Provider location: [place] [employer] [facility] Persons participating in the virtual visit: Myself and Patient     Gerald Palmer. Gerald Ralph, MD 02/06/2020 8:18 AM

## 2020-02-06 NOTE — Assessment & Plan Note (Signed)
Well-controlled.  Will refill Symbicort and albuterol.

## 2020-02-06 NOTE — Assessment & Plan Note (Signed)
Will be careful to watch for hypoglycemic signs while on prednisone.  He will continue Metformin 750 mg daily.  Needs A1c with next blood draw.

## 2020-02-14 DIAGNOSIS — G4733 Obstructive sleep apnea (adult) (pediatric): Secondary | ICD-10-CM | POA: Diagnosis not present

## 2020-03-06 ENCOUNTER — Ambulatory Visit: Payer: Federal, State, Local not specified - PPO | Attending: Internal Medicine

## 2020-03-06 DIAGNOSIS — Z23 Encounter for immunization: Secondary | ICD-10-CM

## 2020-03-06 NOTE — Progress Notes (Signed)
   Covid-19 Vaccination Clinic  Name:  Gerald Palmer    MRN: [id number] DOB: 1976/11/07  03/06/2020  Mr. Gerald Palmer was observed post Covid-19 immunization for 15 minutes without incident. He was provided with Vaccine Information Sheet and instruction to access the V-Safe system.   Mr. Gerald Palmer was instructed to call 911 with any severe reactions post vaccine: Marland Kitchen Difficulty breathing  . Swelling of face and throat  . A fast heartbeat  . A bad rash all over body  . Dizziness and weakness   Immunizations Administered    Name Date Dose VIS Date Route   Pfizer COVID-19 Vaccine 03/06/2020  8:40 AM 0.3 mL 11/30/2019 Intramuscular   Manufacturer: [employer], [employer]   Lot: [id number]   NDC: [address]-8

## 2020-04-02 ENCOUNTER — Ambulatory Visit: Payer: Federal, State, Local not specified - PPO | Attending: Internal Medicine

## 2020-04-02 DIAGNOSIS — Z23 Encounter for immunization: Secondary | ICD-10-CM

## 2020-04-02 NOTE — Progress Notes (Signed)
   Covid-19 Vaccination Clinic  Name:  Triston Skare    MRN: [id number] DOB: 03/12/1976  04/02/2020  Mr. Coppola was observed post Covid-19 immunization for 15 minutes without incident. He was provided with Vaccine Information Sheet and instruction to access the V-Safe system.   Mr. Casanas was instructed to call 911 with any severe reactions post vaccine: Marland Kitchen Difficulty breathing  . Swelling of face and throat  . A fast heartbeat  . A bad rash all over body  . Dizziness and weakness   Immunizations Administered    Name Date Dose VIS Date Route   Pfizer COVID-19 Vaccine 04/02/2020  8:11 AM 0.3 mL 11/30/2019 Intramuscular   Manufacturer: [employer], [employer]   Lot: [id number]   NDC: [address]-5

## 2020-04-15 ENCOUNTER — Other Ambulatory Visit: Payer: Self-pay | Admitting: *Deleted

## 2020-04-15 MED ORDER — METFORMIN HCL ER 750 MG PO TB24: 750.0000 mg | ORAL_TABLET | Freq: Every day | ORAL | 3 refills | Status: DC

## 2020-04-15 MED ORDER — MONTELUKAST SODIUM 10 MG PO TABS: ORAL_TABLET | ORAL | 3 refills | Status: DC

## 2020-05-01 ENCOUNTER — Other Ambulatory Visit: Payer: Self-pay

## 2020-05-01 ENCOUNTER — Ambulatory Visit (INDEPENDENT_AMBULATORY_CARE_PROVIDER_SITE_OTHER): Payer: Federal, State, Local not specified - PPO | Admitting: Family Medicine

## 2020-05-01 ENCOUNTER — Encounter: Payer: Self-pay | Admitting: Family Medicine

## 2020-05-01 VITALS — BP 146/85 | HR 76 | Temp 98.2°F | Ht 67.0 in | Wt 262.4 lb

## 2020-05-01 DIAGNOSIS — Z0001 Encounter for general adult medical examination with abnormal findings: Secondary | ICD-10-CM | POA: Diagnosis not present

## 2020-05-01 DIAGNOSIS — M1000 Idiopathic gout, unspecified site: Secondary | ICD-10-CM

## 2020-05-01 DIAGNOSIS — E785 Hyperlipidemia, unspecified: Secondary | ICD-10-CM | POA: Diagnosis not present

## 2020-05-01 DIAGNOSIS — R739 Hyperglycemia, unspecified: Secondary | ICD-10-CM | POA: Diagnosis not present

## 2020-05-01 DIAGNOSIS — J4540 Moderate persistent asthma, uncomplicated: Secondary | ICD-10-CM | POA: Diagnosis not present

## 2020-05-01 DIAGNOSIS — F320 Major depressive disorder, single episode, mild: Secondary | ICD-10-CM

## 2020-05-01 DIAGNOSIS — E669 Obesity, unspecified: Secondary | ICD-10-CM

## 2020-05-01 DIAGNOSIS — Z6841 Body Mass Index (BMI) 40.0 and over, adult: Secondary | ICD-10-CM

## 2020-05-01 DIAGNOSIS — J3089 Other allergic rhinitis: Secondary | ICD-10-CM

## 2020-05-01 LAB — LIPID PANEL
Cholesterol: 192 mg/dL (ref 0–200)
HDL: 35.7 mg/dL — ABNORMAL LOW (ref >39.00)
LDL Cholesterol: 119 mg/dL — ABNORMAL HIGH (ref 0–99)
NonHDL: 156.6
Total CHOL/HDL Ratio: 5
Triglycerides: 189 mg/dL — ABNORMAL HIGH (ref 0.0–149.0)
VLDL: 37.8 mg/dL (ref 0.0–40.0)

## 2020-05-01 LAB — CBC
HCT: 46.2 % (ref 39.0–52.0)
Hemoglobin: 15.3 g/dL (ref 13.0–17.0)
MCHC: 33 g/dL (ref 30.0–36.0)
MCV: 84.8 fl (ref 78.0–100.0)
Platelets: 359 10*3/uL (ref 150.0–400.0)
RBC: 5.45 Mil/uL (ref 4.22–5.81)
RDW: 14.3 % (ref 11.5–15.5)
WBC: 8.8 10*3/uL (ref 4.0–10.5)

## 2020-05-01 LAB — TSH: TSH: 2.99 u[IU]/mL (ref 0.35–4.50)

## 2020-05-01 LAB — COMPREHENSIVE METABOLIC PANEL
ALT: 38 U/L (ref 0–53)
AST: 25 U/L (ref 0–37)
Albumin: 4.5 g/dL (ref 3.5–5.2)
Alkaline Phosphatase: 85 U/L (ref 39–117)
BUN: 13 mg/dL (ref 6–23)
CO2: 29 mEq/L (ref 19–32)
Calcium: 9.2 mg/dL (ref 8.4–10.5)
Chloride: 102 mEq/L (ref 96–112)
Creatinine, Ser: 1.44 mg/dL (ref 0.40–1.50)
GFR: 65.06 mL/min (ref >60.00)
Glucose, Bld: 96 mg/dL (ref 70–99)
Potassium: 4.3 mEq/L (ref 3.5–5.1)
Sodium: 138 mEq/L (ref 135–145)
Total Bilirubin: 0.6 mg/dL (ref 0.2–1.2)
Total Protein: 7.1 g/dL (ref 6.0–8.3)

## 2020-05-01 LAB — HEMOGLOBIN A1C: Hgb A1c MFr Bld: 6.4 % (ref 4.6–6.5)

## 2020-05-01 LAB — URIC ACID: Uric Acid, Serum: 8.3 mg/dL — ABNORMAL HIGH (ref 4.0–7.8)

## 2020-05-01 MED ORDER — KETOROLAC TROMETHAMINE 60 MG/2ML IM SOLN
60.0000 mg | Freq: Once | INTRAMUSCULAR | Status: AC
  Administered 2020-05-01: 60 mg via INTRAMUSCULAR

## 2020-05-01 NOTE — Assessment & Plan Note (Signed)
Check lipid panel, CBC, C met, TSH.

## 2020-05-01 NOTE — Assessment & Plan Note (Signed)
Discussed lifestyle modifications.  He has been on Metformin 750 mg daily for a week or so.  We will recheck A1c today.  We will also place referral to nutrition/diabetes educator.  Will likely need to follow-up in 3 months to recheck depending on A1c results.

## 2020-05-01 NOTE — Assessment & Plan Note (Signed)
Stable.  He will restart Wellbutrin.  We will follow him in a few months.

## 2020-05-01 NOTE — Assessment & Plan Note (Signed)
Stable.  Continue Astelin, Singulair, and Zyrtec.

## 2020-05-01 NOTE — Progress Notes (Signed)
Chief Complaint:  Gerald Palmer is a 44 y.o. male who presents today for his annual comprehensive physical exam.    Assessment/Plan:  Chronic Problems Addressed Today: Hyperglycemia Discussed lifestyle modifications.  He has been on Metformin 750 mg daily for a week or so.  We will recheck A1c today.  We will also place referral to nutrition/diabetes educator.  Will likely need to follow-up in 3 months to recheck depending on A1c results.  Dyslipidemia Check lipid panel, CBC, C met, TSH.  Depression, major, single episode, mild (HCC) Stable.  He will restart Wellbutrin.  We will follow him in a few months.  Gout Mild flare.  Check uric acid level.  He will restart allopurinol.  Will give 60 mg of Toradol for acute flare today.   Allergic rhinitis Stable.  Continue Astelin, Singulair, and Zyrtec.  Asthma Stable.  Continue Symbicort and albuterol as needed.   Body mass index is 41.1 kg/m. / Obese BMI Metric Follow Up - 05/01/20 1319      BMI Metric Follow Up-Please document annually   BMI Metric Follow Up  Education provided        Preventative Healthcare: Up-to-date on vaccines and screenings.  Will check labs including CBC, C met, TSH.  Patient Counseling(The following topics were reviewed and/or handout was given):  -Nutrition: Stressed importance of moderation in sodium/caffeine intake, saturated fat and cholesterol, caloric balance, sufficient intake of fresh fruits, vegetables, and fiber.  -Stressed the importance of regular exercise.   -Substance Abuse: Discussed cessation/primary prevention of tobacco, alcohol, or other drug use; driving or other dangerous activities under the influence; availability of treatment for abuse.   -Injury prevention: Discussed safety belts, safety helmets, smoke detector, smoking near bedding or upholstery.   -Sexuality: Discussed sexually transmitted diseases, partner selection, use of condoms, avoidance of unintended pregnancy and  contraceptive alternatives.   -Dental health: Discussed importance of regular tooth brushing, flossing, and dental visits.  -Health maintenance and immunizations reviewed. Please refer to Health maintenance section.  Return to care in 1 year for next preventative visit.     Subjective:  HPI:  He has no acute complaints today.   He has been try to work on weight loss.  He has stopped taking his medications for a few months but restarted about a week ago.  He admits that he had too much to drink over the past week and has a mild gout flare in his right foot.  Lifestyle Diet: Room for improvement.  Exercise: Working out at home.   Depression screen [facility] 2/9 02/06/2020  Decreased Interest 0  Down, Depressed, Hopeless 0  PHQ - 2 Score 0  Altered sleeping 0  Tired, decreased energy 0  Change in appetite 0  Feeling bad or failure about yourself  0  Trouble concentrating 0  Moving slowly or fidgety/restless 0  Suicidal thoughts 0  PHQ-9 Score 0  Difficult doing work/chores Not difficult at all    There are no preventive care reminders to display for this patient.   ROS: Per HPI, otherwise a complete review of systems was negative.   PMH:  The following were reviewed and entered/updated in epic: Past Medical History:  Diagnosis Date  . Asthma   . Hypertension    Patient Active Problem List   Diagnosis Date Noted  . Meralgia paresthetica 10/05/2019  . Carpal tunnel syndrome, bilateral 08/14/2019  . Severe obstructive sleep apnea-hypopnea syndrome 05/15/2019  . Dyslipidemia 08/08/2018  . Hyperglycemia 08/08/2018  . Depression, major,  single episode, mild ([place]) 08/07/2018  . Gout 03/21/2018  . Knee pain 12/30/2017  . Allergic rhinitis 11/14/2017  . Asthma 10/11/2017   Past Surgical History:  Procedure Laterality Date  . ACHILLES TENDON SURGERY    . ANKLE FRACTURE SURGERY Right   . TONSILLECTOMY      Family History  Problem Relation Age of Onset  . Hypertension  Mother   . Arthritis Mother   . Diabetes Mother   . Hyperlipidemia Mother   . Asthma Father   . Hyperlipidemia Father   . Hypertension Father   . Stroke Father   . Hypertension Brother   . Hyperlipidemia Brother   . Arthritis Maternal Grandmother     Medications- reviewed and updated Current Outpatient Medications  Medication Sig Dispense Refill  . albuterol (VENTOLIN HFA) 108 (90 Base) MCG/ACT inhaler INHALE 2 PUFFS INTO THE LUNGS EVERY 4 TO 6 HOURS AS NEEDED FOR COUGH OR WHEEZING 42.5 g 2  . allopurinol (ZYLOPRIM) 300 MG tablet Take 1 tablet (300 mg total) by mouth daily. 90 tablet 3  . azelastine (ASTELIN) 0.1 % nasal spray Place 2 sprays into both nostrils 2 (two) times daily. 30 mL 12  . budesonide-formoterol (SYMBICORT) 160-4.5 MCG/ACT inhaler Inhale 2 puffs into the lungs 2 (two) times daily. 1 Inhaler 5  . buPROPion (WELLBUTRIN XL) 300 MG 24 hr tablet Take 1 tablet (300 mg total) by mouth daily. 90 tablet 3  . cetirizine (ZYRTEC) 10 MG tablet Take 1 tablet (10 mg total) by mouth daily. 30 tablet 11  . colchicine 0.6 MG tablet Take 1 tablet (0.6 mg total) by mouth daily as needed (gout). 30 tablet 5  . diclofenac (VOLTAREN) 75 MG EC tablet Take 1 tablet (75 mg total) by mouth 2 (two) times daily. 30 tablet 0  . ipratropium-albuterol (DUONEB) 0.5-2.5 (3) MG/3ML SOLN INHALE THE CONTENTS OF ONE VIAL IN NEBULIZER EVERY 4 TO 6 HOURS AS NEEDED FOR COUGH OR WHEEZE 540 mL 1  . metFORMIN (GLUCOPHAGE-XR) 750 MG 24 hr tablet Take 1 tablet (750 mg total) by mouth daily with breakfast. 90 tablet 3  . montelukast (SINGULAIR) 10 MG tablet Take one tablet once daily 90 tablet 3   No current facility-administered medications for this visit.    Allergies-reviewed and updated Allergies  Allergen Reactions  . Aspirin     unknown  . Ibuprofen Hives    High doses cause hives  . Nsaids Hives  . Shellfish Allergy Itching    Eyes swell    Social History   Socioeconomic History  . Marital  status: Single    Spouse name: Not on file  . Number of children: Not on file  . Years of education: Not on file  . Highest education level: Not on file  Occupational History  . Not on file  Tobacco Use  . Smoking status: Former Research scientist (life sciences)  . Smokeless tobacco: Never Used  Substance and Sexual Activity  . Alcohol use: Yes    Comment: occasionally  . Drug use: No  . Sexual activity: Yes  Other Topics Concern  . Not on file  Social History Narrative  . Not on file   Social Determinants of Health   Financial Resource Strain:   . Difficulty of Paying Living Expenses:   Food Insecurity:   . Worried About Charity fundraiser in the Last Year:   . Arboriculturist in the Last Year:   Transportation Needs:   . Film/video editor (Medical):   Marland Kitchen  Lack of Transportation (Non-Medical):   Physical Activity:   . Days of Exercise per Week:   . Minutes of Exercise per Session:   Stress:   . Feeling of Stress :   Social Connections:   . Frequency of Communication with Friends and Family:   . Frequency of Social Gatherings with Friends and Family:   . Attends Religious Services:   . Active Member of Clubs or Organizations:   . Attends Archivist Meetings:   Marland Kitchen Marital Status:         Objective:  Physical Exam: BP (!) 146/85 (BP Location: Left Arm, Patient Position: Sitting, Cuff Size: Normal)   Pulse 76   Temp 98.2 F (36.8 C) (Temporal)   Ht 5' 7" (1.702 m)   Wt 262 lb 6.4 oz (119 kg)   SpO2 97%   BMI 41.10 kg/m   Body mass index is 41.1 kg/m. Wt Readings from Last 3 Encounters:  05/01/20 262 lb 6.4 oz (119 kg)  02/06/20 269 lb (122 kg)  10/05/19 269 lb (122 kg)   Gen: NAD, resting comfortably HEENT: TMs normal bilaterally. OP clear. No thyromegaly noted.  CV: RRR with no murmurs appreciated Pulm: NWOB, CTAB with no crackles, wheezes, or rhonchi GI: Normal bowel sounds present. Soft, Nontender, Nondistended. MSK: no edema, cyanosis, or clubbing noted Skin:  warm, dry Neuro: CN2-12 grossly intact. Strength 5/5 in upper and lower extremities. Reflexes symmetric and intact bilaterally.  Psych: Normal affect and thought content     Caleb M. Jerline Pain, MD 05/01/2020 2:14 PM

## 2020-05-01 NOTE — Addendum Note (Signed)
Addended by: Dyann Kief on: 05/01/2020 02:33 PM   Modules accepted: Orders

## 2020-05-01 NOTE — Patient Instructions (Signed)
It was very nice to see you today!  We will give youand injection of toradol today.   You can try taking your medications in the evening to see if this is easier.  We will check blood work today.  Likely see back in 3 months depending on results of your blood work.  Take care, Dr [name]  Please try these tips to maintain a healthy lifestyle:   Eat at least 3 REAL meals and 1-2 snacks per day.  Aim for no more than 5 hours between eating.  If you eat breakfast, please do so within one hour of getting up.    Each meal should contain half fruits/vegetables, one quarter protein, and one quarter carbs (no bigger than a computer mouse)   Cut down on sweet beverages. This includes juice, soda, and sweet tea.     Drink at least 1 glass of water with each meal and aim for at least 8 glasses per day   Exercise at least 150 minutes every week.

## 2020-05-01 NOTE — Assessment & Plan Note (Signed)
Mild flare.  Check uric acid level.  He will restart allopurinol.  Will give 60 mg of Toradol for acute flare today.

## 2020-05-01 NOTE — Assessment & Plan Note (Signed)
Stable.  Continue Symbicort and albuterol as needed. 

## 2020-05-02 NOTE — Progress Notes (Signed)
Please inform patient of the following:  His gout number is a bit high but everything else looks good. Would like for him to continue with medications as we discussed. Would like for him to come back in 6 months to recheck his A1c.  Gerald Palmer. [name], MD 05/02/2020 9:10 AM

## 2020-05-14 DIAGNOSIS — G4733 Obstructive sleep apnea (adult) (pediatric): Secondary | ICD-10-CM | POA: Diagnosis not present

## 2020-05-29 ENCOUNTER — Other Ambulatory Visit: Payer: Self-pay | Admitting: Family Medicine

## 2020-06-11 ENCOUNTER — Other Ambulatory Visit: Payer: Self-pay | Admitting: Family Medicine

## 2020-06-11 ENCOUNTER — Encounter: Payer: Self-pay | Admitting: Family Medicine

## 2020-06-12 MED ORDER — PREDNISONE 50 MG PO TABS: ORAL_TABLET | ORAL | 0 refills | Status: DC

## 2020-07-08 ENCOUNTER — Telehealth (INDEPENDENT_AMBULATORY_CARE_PROVIDER_SITE_OTHER): Payer: Federal, State, Local not specified - PPO | Admitting: Family Medicine

## 2020-07-08 DIAGNOSIS — J4540 Moderate persistent asthma, uncomplicated: Secondary | ICD-10-CM | POA: Diagnosis not present

## 2020-07-08 DIAGNOSIS — K143 Hypertrophy of tongue papillae: Secondary | ICD-10-CM

## 2020-07-08 MED ORDER — NYSTATIN 100000 UNIT/ML MT SUSP: 5.0000 mL | Freq: Four times a day (QID) | OROMUCOSAL | 0 refills | Status: DC

## 2020-07-08 NOTE — Assessment & Plan Note (Signed)
Stable.  Is having some side effects to the Symbicort but is otherwise tolerating well.  We will continue Symbicort and albuterol.  Start nystatin to help with thrush as above.

## 2020-07-08 NOTE — Progress Notes (Signed)
   Gerald Palmer is a 44 y.o. male who presents today for a virtual office visit.  Assessment/Plan:  New/Acute Problems: Black tongue Likely side effect of Symbicort.  May have mild thrush.  Will start nystatin swish and swallow.  Discussed reasons to return to care.  Follow-up as needed.  Chronic Problems Addressed Today: Asthma Stable.  Is having some side effects to the Symbicort but is otherwise tolerating well.  We will continue Symbicort and albuterol.  Start nystatin to help with thrush as above.     Subjective:  HPI:  Started this morning. Located on the back of his tongue.  No pain. No recent changes. Seems to be receding.  He was able to scrape some area up with his fingernail this morning.  He has been compliant with all his medication.  No recent medication changes.       Objective/Observations  Physical Exam: Gen: NAD, resting comfortably HEENT: Black plaque noted on posterior tongue Pulm: Normal work of breathing Neuro: Grossly normal, moves all extremities Psych: Normal affect and thought content  Virtual Visit via Video   I connected with Gerald Palmer on 07/08/20 at 11:40 AM EDT by a video enabled telemedicine application and verified that I am speaking with the correct person using two identifiers. The limitations of evaluation and management by telemedicine and the availability of in person appointments were discussed. The patient expressed understanding and agreed to proceed.   Patient location: Home Provider location: [place] [employer] Persons participating in the virtual visit: Myself and Patient     Katina Degree. [name], MD 07/08/2020 9:22 AM

## 2020-07-17 ENCOUNTER — Telehealth: Payer: Self-pay

## 2020-07-17 NOTE — Telephone Encounter (Signed)
Patient dropped off FMLA paperwork, placed upfront in Dr.Parkers basket.

## 2020-07-18 NOTE — Telephone Encounter (Signed)
Noted  

## 2020-08-11 ENCOUNTER — Encounter: Payer: Self-pay | Admitting: Family Medicine

## 2020-08-12 NOTE — Telephone Encounter (Signed)
Unable to contact Pt, voice mail is full.  FMLA send and scam

## 2020-08-13 DIAGNOSIS — G4733 Obstructive sleep apnea (adult) (pediatric): Secondary | ICD-10-CM | POA: Diagnosis not present

## 2020-08-22 DIAGNOSIS — Z20822 Contact with and (suspected) exposure to covid-19: Secondary | ICD-10-CM | POA: Diagnosis not present

## 2020-08-28 ENCOUNTER — Telehealth: Payer: Self-pay | Admitting: Family Medicine

## 2020-08-28 NOTE — Telephone Encounter (Signed)
Patient came in the office and dropped of FMLA paperwork. The deadline is 09/02/20 patient would like called once the paperwork has been faxed. Paperwork has been put in Dr. [name] folder.

## 2020-09-05 NOTE — Telephone Encounter (Signed)
Can you check on the status of the paperwork. Since I am helping outside the office

## 2020-09-05 NOTE — Telephone Encounter (Signed)
FMLA for was fax on 08/05/2020 Pt notified

## 2020-11-06 ENCOUNTER — Encounter: Payer: Self-pay | Admitting: Family Medicine

## 2020-11-07 NOTE — Telephone Encounter (Signed)
Please advise 

## 2020-11-10 ENCOUNTER — Other Ambulatory Visit: Payer: Self-pay | Admitting: *Deleted

## 2020-11-10 DIAGNOSIS — J45901 Unspecified asthma with (acute) exacerbation: Secondary | ICD-10-CM

## 2020-11-10 MED ORDER — ALBUTEROL SULFATE HFA 108 (90 BASE) MCG/ACT IN AERS: INHALATION_SPRAY | RESPIRATORY_TRACT | 2 refills | Status: DC

## 2020-11-10 MED ORDER — PREDNISONE 50 MG PO TABS: ORAL_TABLET | ORAL | 0 refills | Status: DC

## 2020-11-10 MED ORDER — BUDESONIDE-FORMOTEROL FUMARATE 160-4.5 MCG/ACT IN AERO: INHALATION_SPRAY | RESPIRATORY_TRACT | 5 refills | Status: DC

## 2020-11-10 NOTE — Telephone Encounter (Signed)
Rx send to pharmacy Pt stated still feeling tightness in chest and labored breathing

## 2020-12-09 ENCOUNTER — Other Ambulatory Visit: Payer: Federal, State, Local not specified - PPO

## 2020-12-09 DIAGNOSIS — G4733 Obstructive sleep apnea (adult) (pediatric): Secondary | ICD-10-CM | POA: Diagnosis not present

## 2020-12-10 ENCOUNTER — Encounter: Payer: Self-pay | Admitting: Family Medicine

## 2020-12-11 MED ORDER — ALBUTEROL SULFATE (2.5 MG/3ML) 0.083% IN NEBU: 2.5000 mg | INHALATION_SOLUTION | Freq: Four times a day (QID) | RESPIRATORY_TRACT | 1 refills | Status: DC | PRN

## 2020-12-16 ENCOUNTER — Encounter: Payer: Self-pay | Admitting: Family Medicine

## 2020-12-16 ENCOUNTER — Other Ambulatory Visit: Payer: Self-pay

## 2020-12-16 ENCOUNTER — Ambulatory Visit (INDEPENDENT_AMBULATORY_CARE_PROVIDER_SITE_OTHER): Payer: Federal, State, Local not specified - PPO | Admitting: Family Medicine

## 2020-12-16 VITALS — BP 143/81 | HR 77 | Temp 98.2°F | Ht 67.0 in | Wt 254.6 lb

## 2020-12-16 DIAGNOSIS — R739 Hyperglycemia, unspecified: Secondary | ICD-10-CM

## 2020-12-16 DIAGNOSIS — E785 Hyperlipidemia, unspecified: Secondary | ICD-10-CM | POA: Diagnosis not present

## 2020-12-16 DIAGNOSIS — L0291 Cutaneous abscess, unspecified: Secondary | ICD-10-CM | POA: Diagnosis not present

## 2020-12-16 MED ORDER — DOXYCYCLINE HYCLATE 100 MG PO TABS
100.0000 mg | ORAL_TABLET | Freq: Two times a day (BID) | ORAL | 0 refills | Status: DC
Start: 2020-12-16 — End: 2021-03-17

## 2020-12-16 NOTE — Progress Notes (Signed)
   Gerald Palmer is a 44 y.o. male who presents today for an office visit.  Assessment/Plan:  New/Acute Problems: Abscess No red flags.  No areas amenable for I&D today.  Will start doxycycline.  Discussed reasons to return to care.  Chronic Problems Addressed Today: Hyperglycemia On Metformin 750 mg daily.  He has lost about 10 pounds since last visit.  Congratulated patient on this.  Will check A1c at next office visit.  Dyslipidemia Check lipids next blood draw.  Continue lifestyle modifications.  Congratulated patient on weight loss.     Subjective:  HPI:  Patient here with lesion on right side of jaw.  Started 2 months ago.  Initially had some pain to the area.  He had increased in size drained some pus.  Was stable for about a month until the last week or so when started draining will do more pus.  Has minimal pain to the area.  Minimal erythema.  No specific treatments tried.       Objective:  Physical Exam: BP (!) 143/81   Pulse 77   Temp 98.2 F (36.8 C) (Temporal)   Ht 5\' 7"  (1.702 m)   Wt 254 lb 9.6 oz (115.5 kg)   SpO2 97%   BMI 39.88 kg/m   Wt Readings from Last 3 Encounters:  12/16/20 254 lb 9.6 oz (115.5 kg)  05/01/20 262 lb 6.4 oz (119 kg)  02/06/20 269 lb (122 kg)    Gen: No acute distress, resting comfortably Skin: Indurated approximately 2 to 3 cm lesion on right mandible.  No fluctuance noted Neuro: Grossly normal, moves all extremities Psych: Normal affect and thought content      Sharea Guinther M. 02/08/20, MD 12/16/2020 3:15 PM

## 2020-12-16 NOTE — Assessment & Plan Note (Signed)
On Metformin 750 mg daily.  He has lost about 10 pounds since last visit.  Congratulated patient on this.  Will check A1c at next office visit.

## 2020-12-16 NOTE — Patient Instructions (Signed)
It was very nice to see you today!  You had an abscess on your cheek.  Please take antibiotic.  Let me know if not improving over the next few weeks.  Please come back in about 6 months for your annual physical.  We can check blood work at that time.  Please keep up the good work with your diet and exercise.  Take care, Dr [name]  Please try these tips to maintain a healthy lifestyle:   Eat at least 3 REAL meals and 1-2 snacks per day.  Aim for no more than 5 hours between eating.  If you eat breakfast, please do so within one hour of getting up.    Each meal should contain half fruits/vegetables, one quarter protein, and one quarter carbs (no bigger than a computer mouse)   Cut down on sweet beverages. This includes juice, soda, and sweet tea.     Drink at least 1 glass of water with each meal and aim for at least 8 glasses per day   Exercise at least 150 minutes every week.

## 2020-12-16 NOTE — Assessment & Plan Note (Signed)
Check lipids next blood draw.  Continue lifestyle modifications.  Congratulated patient on weight loss.

## 2020-12-24 DIAGNOSIS — G5603 Carpal tunnel syndrome, bilateral upper limbs: Secondary | ICD-10-CM | POA: Diagnosis not present

## 2021-01-11 ENCOUNTER — Other Ambulatory Visit: Payer: Self-pay | Admitting: Family Medicine

## 2021-02-05 ENCOUNTER — Encounter: Payer: Self-pay | Admitting: Family Medicine

## 2021-02-05 NOTE — Telephone Encounter (Signed)
See note

## 2021-02-06 ENCOUNTER — Other Ambulatory Visit: Payer: Self-pay | Admitting: *Deleted

## 2021-02-06 MED ORDER — GABAPENTIN 100 MG PO CAPS: 100.0000 mg | ORAL_CAPSULE | Freq: Three times a day (TID) | ORAL | 0 refills | Status: DC

## 2021-03-04 ENCOUNTER — Encounter: Payer: Self-pay | Admitting: Family Medicine

## 2021-03-04 NOTE — Telephone Encounter (Signed)
See note

## 2021-03-05 ENCOUNTER — Telehealth (INDEPENDENT_AMBULATORY_CARE_PROVIDER_SITE_OTHER): Payer: Federal, State, Local not specified - PPO | Admitting: Family Medicine

## 2021-03-05 DIAGNOSIS — M25571 Pain in right ankle and joints of right foot: Secondary | ICD-10-CM

## 2021-03-05 NOTE — Telephone Encounter (Signed)
Send to front office for appointment

## 2021-03-05 NOTE — Patient Instructions (Signed)
-  We placed a referral for you as discussed. Please contact the [facility] primary care office or your primary care office in 2 days if you have not been contacted regarding the referral.  -continue ice and aleve as needed  -elevate for 30 minutes daily  I hope you are feeling better soon!  Seek in person care promptly sooner if your symptoms worsen or new concerns arise.  It was nice to meet you today. I help [facility] out with telemedicine visits on Tuesdays and Thursdays and am available for visits on those days. If you have any concerns or questions following this visit please schedule a follow up visit with your Primary Care doctor or seek care at a local urgent care clinic to avoid delays in care.

## 2021-03-05 NOTE — Telephone Encounter (Signed)
Pt is scheduled today with Dr Kim

## 2021-03-05 NOTE — Progress Notes (Signed)
Virtual Visit via Video Note  I connected with Gerald Palmer  on 03/05/21 at  4:00 PM EDT by a video enabled telemedicine application and verified that I am speaking with the correct person using two identifiers.  Location patient: home, [place] Location provider:work or home office Persons participating in the virtual visit: patient, provider  I discussed the limitations of evaluation and management by telemedicine and the availability of in person appointments. The patient expressed understanding and agreed to proceed.   HPI:  Acute telemedicine visit for ankle pain: -Onset: 7-8 days ago -reports a history of gout and achilles issues, but reports this feels different -Symptoms include:  Pain and swelling behind R lateral malleoulas -Denies: can't think of new activity or injury -Has tried: icing, aleve, sports cream -Pertinent medication allergies: asa, ibuprofen, nsaids (but reports is fine taking aleve), shellfish  ROS: See pertinent positives and negatives per HPI.  Past Medical History:  Diagnosis Date  . Asthma   . Hypertension     Past Surgical History:  Procedure Laterality Date  . ACHILLES TENDON SURGERY    . ANKLE FRACTURE SURGERY Right   . TONSILLECTOMY       Current Outpatient Medications:  .  albuterol (PROVENTIL) (2.5 MG/3ML) 0.083% nebulizer solution, Take 3 mLs (2.5 mg total) by nebulization every 6 (six) hours as needed for wheezing or shortness of breath., Disp: 75 mL, Rfl: 1 .  albuterol (VENTOLIN HFA) 108 (90 Base) MCG/ACT inhaler, INHALE 2 PUFFS INTO THE LUNGS EVERY 4 TO 6 HOURS AS NEEDED FOR COUGH OR WHEEZING, Disp: 42.5 g, Rfl: 2 .  allopurinol (ZYLOPRIM) 300 MG tablet, TAKE 1 TABLET(300 MG) BY MOUTH DAILY, Disp: 90 tablet, Rfl: 3 .  atorvastatin (LIPITOR) 40 MG tablet, TAKE 1 TABLET(40 MG) BY MOUTH DAILY, Disp: 30 tablet, Rfl: 3 .  azelastine (ASTELIN) 0.1 % nasal spray, Place 2 sprays into both nostrils 2 (two) times daily., Disp: 30 mL, Rfl: 12 .   budesonide-formoterol (SYMBICORT) 160-4.5 MCG/ACT inhaler, 2 puff twice daily, Disp: 10.2 g, Rfl: 5 .  buPROPion (WELLBUTRIN XL) 300 MG 24 hr tablet, Take 1 tablet (300 mg total) by mouth daily. (Patient not taking: Reported on 12/16/2020), Disp: 90 tablet, Rfl: 3 .  cetirizine (ZYRTEC) 10 MG tablet, Take 1 tablet (10 mg total) by mouth daily., Disp: 30 tablet, Rfl: 11 .  colchicine 0.6 MG tablet, TAKE 1 TABLET(0.6 MG) BY MOUTH DAILY AS NEEDED FOR GOUT, Disp: 30 tablet, Rfl: 5 .  diclofenac (VOLTAREN) 75 MG EC tablet, Take 1 tablet (75 mg total) by mouth 2 (two) times daily., Disp: 30 tablet, Rfl: 0 .  doxycycline (VIBRA-TABS) 100 MG tablet, Take 1 tablet (100 mg total) by mouth 2 (two) times daily., Disp: 28 tablet, Rfl: 0 .  gabapentin (NEURONTIN) 100 MG capsule, Take 1 capsule (100 mg total) by mouth 3 (three) times daily., Disp: 90 capsule, Rfl: 0 .  metFORMIN (GLUCOPHAGE-XR) 750 MG 24 hr tablet, Take 1 tablet (750 mg total) by mouth daily with breakfast., Disp: 90 tablet, Rfl: 3 .  montelukast (SINGULAIR) 10 MG tablet, Take one tablet once daily, Disp: 90 tablet, Rfl: 3 .  nystatin (MYCOSTATIN) 100000 UNIT/ML suspension, Take 5 mLs (500,000 Units total) by mouth 4 (four) times daily. Swish in mouth for several minutes then swallow., Disp: 473 mL, Rfl: 0  EXAM:  VITALS per patient if applicable:  GENERAL: alert, oriented, appears well and in no acute distress  HEENT: atraumatic, conjunttiva clear, no obvious abnormalities on inspection of  external nose and ears  NECK: normal movements of the head and neck  LUNGS: on inspection no signs of respiratory distress, breathing rate appears normal, no obvious gross SOB, gasping or wheezing  CV: no obvious cyanosis  MS: moves all visible extremities without noticeable abnormality  PSYCH/NEURO: pleasant and cooperative, no obvious depression or anxiety, speech and thought processing grossly intact  ASSESSMENT AND PLAN:  Discussed the  following assessment and plan:  Acute right ankle pain - Plan: Ambulatory referral to Sports Medicine  -we discussed possible serious and likely etiologies, options for evaluation and workup, limitations of telemedicine visit vs in person visit, treatment, treatment risks and precautions. Pt prefers to treat via telemedicine empirically rather than in person at this moment. Query tendinitis (RL or PB), OA vs gout vs other. After discussion he opted for referral to sports med - placed. He wants to be seen asap. Continue ice, elevation 30 minutes daily, nsaids as able. Scheduled follow up with PCP offered: agrees to schedule follow up if needed Advised to seek prompt in person care if worsening, new symptoms arise while visit with SM pending.. Discussed options for inperson care if PCP office not available including orthopedic urgent care. Did let this patient know that I only do telemedicine on Tuesdays and Thursdays for [place]. Advised to schedule follow up visit with PCP or [facility] if any further questions or concerns to avoid delays in care.   I discussed the assessment and treatment plan with the patient. The patient was provided an opportunity to ask questions and all were answered. The patient agreed with the plan and demonstrated an understanding of the instructions.     Terressa Koyanagi, DO

## 2021-03-10 ENCOUNTER — Ambulatory Visit: Payer: Federal, State, Local not specified - PPO | Admitting: Family Medicine

## 2021-03-13 NOTE — Progress Notes (Signed)
Subjective:    I'm seeing this patient as a consultation for:  Dr. [name]. Note will be routed back to referring provider/PCP.  CC: R ankle pain  I, Molly Weber, LAT, ATC, am serving as scribe for Dr. Clementeen Graham.  HPI: Pt is a 45 y/o male presenting w/ R ankle pain and swelling at the R lateral malleolus x approximately 3 weeks w/ no known MOI. Pt recalls pain started on a Wed after he had done a lot of pushing and pulling at work and pt noticed R ankle was swollen. Pt has a hx of gout. Pt locates pain to distal to the lateral malleolus and posteriorly on calcaneous. Of note, pt works at the [employer].  R ankle swelling: no Aggravating factors: walking on hard surfaces, certain shoes that compress heel Treatments tried: ice, Aleve, topical pain relievers  Past medical history, Surgical history, Family history, Social history, Allergies, and medications have been entered into the medical record, reviewed.   Review of Systems: No new headache, visual changes, nausea, vomiting, diarrhea, constipation, dizziness, abdominal pain, skin rash, fevers, chills, night sweats, weight loss, swollen lymph nodes, body aches, joint swelling, muscle aches, chest pain, shortness of breath, mood changes, visual or auditory hallucinations.   Objective:    Vitals:   03/16/21 0846  BP: (!) 158/98  Pulse: 75  SpO2: 96%   General: Well Developed, well nourished, and in no acute distress.  Neuro/Psych: Alert and oriented x3, extra-ocular muscles intact, able to move all 4 extremities, sensation grossly intact. Skin: Warm and dry, no rashes noted.  Respiratory: Not using accessory muscles, speaking in full sentences, trachea midline.  Cardiovascular: Pulses palpable, no extremity edema. Abdomen: Does not appear distended. MSK: Right ankle trace effusion otherwise normal-appearing Normal motion and strength. Stable ligamentous exam. Pulses capillary fill and sensation are intact distally.  Lab and  Radiology Results  Diagnostic Limited MSK Ultrasound of: Right ankle Right anterior lateral ankle reveals small joint effusion. Intact peroneal tendons with mild tenosynovitis present. Impression: Probable gout causing right lateral ankle effusion  Lab Results  Component Value Date   LABURIC 8.3 (H) 05/01/2020     Chemistry      Component Value Date/Time   NA 138 05/01/2020 1357   K 4.3 05/01/2020 1357   CL 102 05/01/2020 1357   CO2 29 05/01/2020 1357   BUN 13 05/01/2020 1357   CREATININE 1.44 05/01/2020 1357      Component Value Date/Time   CALCIUM 9.2 05/01/2020 1357   ALKPHOS 85 05/01/2020 1357   AST 25 05/01/2020 1357   ALT 38 05/01/2020 1357   BILITOT 0.6 05/01/2020 1357       Impression and Recommendations:    Assessment and Plan: 45 y.o. male with right lateral ankle pain now improving.  Pain thought to be due to gout.  Patient may have some tenosynovitis likely from limping.  Plan to reassess uric acid level as well as metabolic panel.  Patient is taking colchicine but now has had a gout flare.  May need to adjust allopurinol dose.  In the meantime continue current regimen.  Additionally will proceed with home exercise program taught in clinic today by ATC for tendinopathy.  Recommend compression ankle sleeve and Voltaren gel as well.  Recheck in about 6 weeks.  Work note provided.  Consider steroid injection if needed.  PDMP not reviewed this encounter. Orders Placed This Encounter  Procedures  . [place] LIMITED JOINT SPACE STRUCTURES LOW RIGHT(NO LINKED CHARGES)  Standing Status:   Future    Number of Occurrences:   1    Standing Expiration Date:   09/16/2021    Order Specific Question:   Reason for Exam (SYMPTOM  OR DIAGNOSIS REQUIRED)    Answer:   Right ankle pain    Order Specific Question:   Preferred imaging location?    Answer:   [facility]  . Uric acid    Standing Status:   Future    Number of Occurrences:   1    Standing  Expiration Date:   03/16/2022  . Comprehensive metabolic panel    Standing Status:   Future    Number of Occurrences:   1    Standing Expiration Date:   03/16/2022   No orders of the defined types were placed in this encounter.   Discussed warning signs or symptoms. Please see discharge instructions. Patient expresses understanding.   The above documentation has been reviewed and is accurate and complete Clementeen Graham, M.D.

## 2021-03-16 ENCOUNTER — Other Ambulatory Visit: Payer: Self-pay

## 2021-03-16 ENCOUNTER — Ambulatory Visit (INDEPENDENT_AMBULATORY_CARE_PROVIDER_SITE_OTHER): Payer: Federal, State, Local not specified - PPO | Admitting: Family Medicine

## 2021-03-16 ENCOUNTER — Ambulatory Visit: Payer: Self-pay

## 2021-03-16 VITALS — BP 158/98 | HR 75 | Ht 67.0 in | Wt 256.2 lb

## 2021-03-16 DIAGNOSIS — M10071 Idiopathic gout, right ankle and foot: Secondary | ICD-10-CM | POA: Diagnosis not present

## 2021-03-16 DIAGNOSIS — M25571 Pain in right ankle and joints of right foot: Secondary | ICD-10-CM

## 2021-03-16 LAB — COMPREHENSIVE METABOLIC PANEL
ALT: 33 U/L (ref 0–53)
AST: 23 U/L (ref 0–37)
Albumin: 4.6 g/dL (ref 3.5–5.2)
Alkaline Phosphatase: 73 U/L (ref 39–117)
BUN: 15 mg/dL (ref 6–23)
CO2: 29 mEq/L (ref 19–32)
Calcium: 9.3 mg/dL (ref 8.4–10.5)
Chloride: 103 mEq/L (ref 96–112)
Creatinine, Ser: 1.42 mg/dL (ref 0.40–1.50)
GFR: 60.74 mL/min (ref >60.00)
Glucose, Bld: 84 mg/dL (ref 70–99)
Potassium: 3.7 mEq/L (ref 3.5–5.1)
Sodium: 139 mEq/L (ref 135–145)
Total Bilirubin: 0.6 mg/dL (ref 0.2–1.2)
Total Protein: 7 g/dL (ref 6.0–8.3)

## 2021-03-16 LAB — URIC ACID: Uric Acid, Serum: 7.4 mg/dL (ref 4.0–7.8)

## 2021-03-16 NOTE — Patient Instructions (Addendum)
Thank you for coming in today.  Please get labs today before you leave today.  Please use voltaren gel up to 4x daily for pain as needed.   I recommend you obtained a compression sleeve to help with your joint problems. There are many options on the market however I recommend obtaining a full ankle Body Helix compression sleeve.  You can find information (including how to appropriate measure yourself for sizing) can be found at www.[email or web address].ch.  Many of these products are health savings account (HSA) eligible.   You can use the compression sleeve at any time throughout the day but is most important to use while being active as well as for 2 hours post-activity.   It is appropriate to ice following activity with the compression sleeve in place.  If you need it you can get and use a cam walker boot at [facility] supply or [employer].   Please complete the exercises that the athletic trainer went over with you: View at my-exercise-code.com using code: [email or web address]  Continue colchicine as needed until better.   Continue allopurinol indefinitely.   Recheck with me in 6 weeks.

## 2021-03-17 MED ORDER — ALLOPURINOL 300 MG PO TABS: 450.0000 mg | ORAL_TABLET | Freq: Every day | ORAL | 3 refills | Status: DC

## 2021-03-17 NOTE — Progress Notes (Signed)
Uric acid is higher than I would like at 7.4.  Goal is less than 6 for gout.  Increase allopurinol to 1-1/2 tablets/day.  This would be a total of 450 mg/day.  New prescription sent to the [facility] pharmacy on [address] and [address]

## 2021-03-17 NOTE — Addendum Note (Signed)
Addended by: Rodolph Bong on: 03/17/2021 07:24 AM   Modules accepted: Orders

## 2021-03-30 ENCOUNTER — Other Ambulatory Visit: Payer: Self-pay | Admitting: Family Medicine

## 2021-03-30 MED ORDER — AMOXICILLIN 875 MG PO TABS: 875.0000 mg | ORAL_TABLET | Freq: Two times a day (BID) | ORAL | 0 refills | Status: DC

## 2021-04-09 DIAGNOSIS — G4733 Obstructive sleep apnea (adult) (pediatric): Secondary | ICD-10-CM | POA: Diagnosis not present

## 2021-04-27 ENCOUNTER — Ambulatory Visit: Payer: Federal, State, Local not specified - PPO | Admitting: Family Medicine

## 2021-04-27 ENCOUNTER — Encounter: Payer: Self-pay | Admitting: Family Medicine

## 2021-04-27 DIAGNOSIS — Z20822 Contact with and (suspected) exposure to covid-19: Secondary | ICD-10-CM | POA: Diagnosis not present

## 2021-04-28 ENCOUNTER — Other Ambulatory Visit (HOSPITAL_COMMUNITY): Payer: Self-pay

## 2021-04-28 ENCOUNTER — Other Ambulatory Visit: Payer: Self-pay | Admitting: Family Medicine

## 2021-04-28 ENCOUNTER — Other Ambulatory Visit: Payer: Federal, State, Local not specified - PPO

## 2021-04-28 ENCOUNTER — Telehealth (INDEPENDENT_AMBULATORY_CARE_PROVIDER_SITE_OTHER): Payer: Federal, State, Local not specified - PPO | Admitting: Family Medicine

## 2021-04-28 ENCOUNTER — Encounter: Payer: Self-pay | Admitting: Family Medicine

## 2021-04-28 DIAGNOSIS — J45901 Unspecified asthma with (acute) exacerbation: Secondary | ICD-10-CM

## 2021-04-28 DIAGNOSIS — U071 COVID-19: Secondary | ICD-10-CM

## 2021-04-28 MED ORDER — NIRMATRELVIR/RITONAVIR (PAXLOVID)TABLET
3.0000 | ORAL_TABLET | Freq: Two times a day (BID) | ORAL | 0 refills | Status: AC
  Filled 2021-04-28: qty 30, 5d supply, fill #0

## 2021-04-28 MED ORDER — BENZONATATE 100 MG PO CAPS
100.0000 mg | ORAL_CAPSULE | Freq: Three times a day (TID) | ORAL | 0 refills | Status: DC | PRN
  Filled 2021-04-28: qty 20, 7d supply, fill #0

## 2021-04-28 NOTE — Progress Notes (Signed)
Virtual Visit via Video Note  I connected with Gerald Palmer  on 04/28/21 at 10:40 AM EDT by a video enabled telemedicine application and verified that I am speaking with the correct person using two identifiers.  Location patient: home, [place] Location provider:work or home office Persons participating in the virtual visit: patient, provider  I discussed the limitations of evaluation and management by telemedicine and the availability of in person appointments. The patient expressed understanding and agreed to proceed.   HPI:  Acute telemedicine visit for COVID19: -Onset: 4 days ago -Symptoms include: thought he had a cold - nasal congestion, cough, mild sob yesterday on stairs, feels tired, chills, mild diarrhea last night  -Denies: CP, SOB today, NVD, inability to eat/drink/get out of bed -Has tried: robitussin -Pertinent past medical history: asthma, obesity, HTN -Pertinent medication allergies: Allergies  Allergen Reactions  . Aspirin     unknown  . Ibuprofen Hives    High doses cause hives  . Nsaids Hives  . Shellfish Allergy Itching    Eyes swell   Interactions with paxlovid and colchicine (however he has not taken colchicine in over 1 month) and atorvastatin.O/w no interactions per FDA factsheet for provider and [place] drug interaction sheet.  -renal and kidney function normal on recent labs -COVID-19 vaccine status: has had two doses  ROS: See pertinent positives and negatives per HPI.  Past Medical History:  Diagnosis Date  . Asthma   . Hypertension     Past Surgical History:  Procedure Laterality Date  . ACHILLES TENDON SURGERY    . ANKLE FRACTURE SURGERY Right   . TONSILLECTOMY       Current Outpatient Medications/supplement/OTC:  .  albuterol (PROVENTIL) (2.5 MG/3ML) 0.083% nebulizer solution, Take 3 mLs (2.5 mg total) by nebulization every 6 (six) hours as needed for wheezing or shortness of breath., Disp: 75 mL, Rfl: 1 .  albuterol (VENTOLIN HFA) 108 (90  Base) MCG/ACT inhaler, INHALE 2 PUFFS INTO THE LUNGS EVERY 4 TO 6 HOURS AS NEEDED FOR COUGH OR WHEEZING, Disp: 42.5 g, Rfl: 2 .  allopurinol (ZYLOPRIM) 300 MG tablet, Take 1.5 tablets (450 mg total) by mouth daily., Disp: 135 tablet, Rfl: 3 .  atorvastatin (LIPITOR) 40 MG tablet, TAKE 1 TABLET(40 MG) BY MOUTH DAILY, Disp: 30 tablet, Rfl: 3 .  azelastine (ASTELIN) 0.1 % nasal spray, Place 2 sprays into both nostrils 2 (two) times daily., Disp: 30 mL, Rfl: 12 .  benzonatate (TESSALON PERLES) 100 MG capsule, Take 1 capsule (100 mg total) by mouth 3 (three) times daily as needed., Disp: 20 capsule, Rfl: 0 .  budesonide-formoterol (SYMBICORT) 160-4.5 MCG/ACT inhaler, 2 puff twice daily, Disp: 10.2 g, Rfl: 5 .  cetirizine (ZYRTEC) 10 MG tablet, Take 1 tablet (10 mg total) by mouth daily., Disp: 30 tablet, Rfl: 11 .  colchicine 0.6 MG tablet, TAKE 1 TABLET(0.6 MG) BY MOUTH DAILY AS NEEDED FOR GOUT, Disp: 30 tablet, Rfl: 5 .  gabapentin (NEURONTIN) 100 MG capsule, Take 1 capsule (100 mg total) by mouth 3 (three) times daily., Disp: 90 capsule, Rfl: 0 .  montelukast (SINGULAIR) 10 MG tablet, Take one tablet once daily, Disp: 90 tablet, Rfl: 3 .  nirmatrelvir/ritonavir EUA (PAXLOVID) TABS, Take 3 tablets by mouth 2 (two) times daily for 5 days., Disp: 30 tablet, Rfl: 0 Reviewed with patient.  EXAM:  VITALS per patient if applicable:  GENERAL: alert, oriented, appears well and in no acute distress  HEENT: atraumatic, conjunttiva clear, no obvious abnormalities on inspection of external  nose and ears  NECK: normal movements of the head and neck  LUNGS: on inspection no signs of respiratory distress, breathing rate appears normal, no obvious gross SOB, gasping or wheezing  CV: no obvious cyanosis  MS: moves all visible extremities without noticeable abnormality  PSYCH/NEURO: pleasant and cooperative, no obvious depression or anxiety, speech and thought processing grossly intact  ASSESSMENT AND  PLAN:  Discussed the following assessment and plan:  COVID-19  -we discussed possible serious and likely etiologies, options for evaluation and workup, limitations of telemedicine visit vs in person visit, treatment, treatment risks and precautions. Pt prefers to treat via telemedicine empirically rather than in person at this moment. Discussed treatment options, ideal treatment window, potential complications, isolation and precautions for COVID-19.   The patient opted for treatment with Paxlovid due to being higher risk for complications of covid or severe disease. Discussed EUA status limited knowledge of risks/interactions/side effects per EUA document and liverpol interaction checker vs possible benefits and precautions share with patient and provided in patient instructions. He had recent labs and has opted to hold his statin during Paxlovid use. He has not taken atorvastatin for over 1 month he tells me and I advised not to take with Paxlovid. Also, advised that patient discuss risks/interactions and use with pharmacist/treatment team as well.  Work/School slipped offered: provided in patient instructions   Scheduled follow up with PCP offered: as needed Advised to seek prompt in person care if worsening, new symptoms arise, or if is not improving with treatment. Discussed options for inperson care if PCP office not available. Did let this patient know that I only do telemedicine on Tuesdays and Thursdays for [place]. Advised to schedule follow up visit with PCP or [facility] if any further questions or concerns to avoid delays in care.   I discussed the assessment and treatment plan with the patient. The patient was provided an opportunity to ask questions and all were answered. The patient agreed with the plan and demonstrated an understanding of the instructions.     Terressa Koyanagi, DO

## 2021-04-28 NOTE — Patient Instructions (Addendum)
---------------------------------------------------------------------------------------------------------------------------    WORK SLIP:  Patient Gerald Palmer,  02-Jul-1976, was seen for a medical visit today, 04/28/21 . Please excuse from work for a COVID like illness. We advise 10 days minimum from the onset of symptoms (04/24/21) PLUS 1 day of no fever and improved symptoms. Will defer to employer for a sooner return to work if symptoms have resolved, it is greater than 5 days since the positive test and the patient can wear a high-quality, tight fitting mask such as N95 or KN95 at all times for an additional 5 days. Would also suggest COVID19 antigen testing is negative prior to return.  Sincerely: E-signature: Dr. Colin Benton, DO [facility] [place] Ph: [phone]   ------------------------------------------------------------------------------------------------------------------------------  HOME CARE TIPS:    -I sent the medication(s) we discussed to your pharmacy: Meds ordered this encounter  Medications  . benzonatate (TESSALON PERLES) 100 MG capsule    Sig: Take 1 capsule (100 mg total) by mouth 3 (three) times daily as needed.    Dispense:  20 capsule    Refill:  0  . nirmatrelvir/ritonavir EUA (PAXLOVID) TABS    Sig: Take 3 tablets by mouth 2 (two) times daily for 5 days.    Dispense:  30 tablet    Refill:  0     -I sent in the [facility] treatment or referral you requested per our discussion. Please see the information provided below and discuss further with the pharmacist/treatment team.   -can use nasal saline a few times per day if you have nasal congestion; sometimes  a short course of Afrin nasal spray for 3 days can help with symptoms as well  -stay hydrated, drink plenty of fluids and eat small healthy meals - avoid dairy  -can take 1000 IU (62mg) Vit D3 and 100-500 mg of Vit C daily per instructions  -If the Covid test is positive,  check out the [facility] for more information on home care, transmission and treatment for COVID19  -follow up with your doctor in 2-3 days unless improving and feeling better  -stay home while sick, except to seek medical care. If you have COVID19, ideally it would be best to stay home for a full 10 days since the onset of symptoms PLUS one day of no fever and feeling better. Wear a good mask that fits snugly (such as N95 or KN95) if around others to reduce the risk of transmission.  It was nice to meet you today, and I really hope you are feeling better soon. I help [place] out with telemedicine visits on Tuesdays and Thursdays and am available for visits on those days. If you have any concerns or questions following this visit please schedule a follow up visit with your Primary Care doctor or seek care at a local urgent care clinic to avoid delays in care.    Seek in person care or schedule a follow up video visit promptly if your symptoms worsen, new concerns arise or you are not improving with treatment. Call 911 and/or seek emergency care if your symptoms are severe or life threatening.   FACT SHEET FOR PATIENTS, PARENTS, AND CAREGIVERS EMERGENCY USE AUTHORIZATION (EUA) OF PAXLOVID FOR CORONAVIRUS DISEASE 2019 (COVID-19) You are being given this Fact Sheet because your healthcare provider believes it is necessary to provide you with PAXLOVID for the treatment of mild-to-moderate coronavirus disease (COVID-19) caused by the SARS-CoV-2 virus. This Fact Sheet contains information to help you understand the risks and benefits  of taking the PAXLOVID you have received or may receive. The U.S. Food and Drug Administration (FDA) has issued an Emergency Use Authorization (EUA) to make PAXLOVID available during the COVID-19 pandemic (for more details about an EUA please see "What is an Emergency Use Authorization?" at the end of this document). PAXLOVID is not an FDA-approved medicine in  the [place]. Read this Fact Sheet for information about PAXLOVID. Talk to your healthcare provider about your options or if you have any questions. It is your choice to take PAXLOVID.  What is COVID-19? COVID-19 is caused by a virus called a coronavirus. You can get COVID-19 through close contact with another person who has the virus. COVID-19 illnesses have ranged from very mild-to-severe, including illness resulting in death. While information so far suggests that most COVID-19 illness is mild, serious illness can happen and may cause some of your other medical conditions to become worse. Older people and people of all ages with severe, long lasting (chronic) medical conditions like heart disease, lung disease, and diabetes, for example seem to be at higher risk of being hospitalized for COVID-19.  What is PAXLOVID? PAXLOVID is an investigational medicine used to treat mild-to-moderate COVID-19 in adults and children [52 years of age and older weighing at least 25 pounds (73 kg)] with positive results of direct SARS-CoV-2 viral testing, and who are at high risk for progression to severe COVID-19, including hospitalization or death. PAXLOVID is investigational because it is still being studied. There is limited information about the safety and effectiveness of using PAXLOVID to treat people with mild-to-moderate COVID-19.  The FDA has authorized the emergency use of PAXLOVID for the treatment of mild-tomoderate COVID-19 in adults and children [47 years of age and older weighing at least 21 pounds (26 kg)] with a positive test for the virus that causes COVID-19, and who are at high risk for progression to severe COVID-19, including hospitalization or death, under an EUA. 1 Revised: 06 March 2021   What should I tell my healthcare provider before I take PAXLOVID? Tell your healthcare provider if you: . Have any allergies . Have liver or kidney disease . Are pregnant or plan to  become pregnant . Are breastfeeding a child . Have any serious illnesses  Tell your healthcare provider about all the medicines you take, including prescription and over-the-counter medicines, vitamins, and herbal supplements. Some medicines may interact with PAXLOVID and may cause serious side effects. Keep a list of your medicines to show your healthcare provider and pharmacist when you get a new medicine.  You can ask your healthcare provider or pharmacist for a list of medicines that interact with PAXLOVID. Do not start taking a new medicine without telling your healthcare provider. Your healthcare provider can tell you if it is safe to take PAXLOVID with other medicines.  Tell your healthcare provider if you are taking combined hormonal contraceptive. PAXLOVID may affect how your birth control pills work. Females who are able to become pregnant should use another effective alternative form of contraception or an additional barrier method of contraception. Talk to your healthcare provider if you have any questions about contraceptive methods that might be right for you.  How do I take PAXLOVID? Marland Kitchen PAXLOVID consists of 2 medicines: nirmatrelvir and ritonavir. o Take 2 pink tablets of nirmatrelvir with 1 white tablet of ritonavir by mouth 2 times each day (in the morning and in the evening) for 5 days. For each dose, take all 3 tablets at the same  time. o If you have kidney disease, talk to your healthcare provider. You may need a different dose. Ricka Burdock the tablets whole. Do not chew, break, or crush the tablets. . Take PAXLOVID with or without food. . Do not stop taking PAXLOVID without talking to your healthcare provider, even if you feel better. . If you miss a dose of PAXLOVID within 8 hours of the time it is usually taken, take it as soon as you remember. If you miss a dose by more than 8 hours, skip the missed dose and take the next dose at your regular time. Do not  take 2 doses of PAXLOVID at the same time. . If you take too much PAXLOVID, call your healthcare provider or go to the nearest hospital emergency room right away. . If you are taking a ritonavir- or cobicistat-containing medicine to treat hepatitis C or Human Immunodeficiency Virus (HIV), you should continue to take your medicine as prescribed by your healthcare provider. 2 Revised: 06 March 2021    Talk to your healthcare provider if you do not feel better or if you feel worse after 5 days.  Who should generally not take PAXLOVID? Do not take PAXLOVID if: . You are allergic to nirmatrelvir, ritonavir, or any of the ingredients in PAXLOVID. Marland Kitchen You are taking any of the following medicines: o Alfuzosin o Pethidine, propoxyphene o Ranolazine o Amiodarone, dronedarone, flecainide, propafenone, quinidine o Colchicine o Lurasidone, pimozide, clozapine o Dihydroergotamine, ergotamine, methylergonovine o Lovastatin, simvastatin o Sildenafil (Revatio) for pulmonary arterial hypertension (PAH) o Triazolam, oral midazolam o Apalutamide o Carbamazepine, phenobarbital, phenytoin o Rifampin o St. John's Wort (hypericum perforatum) Taking PAXLOVID with these medicines may cause serious or life-threatening side effects or affect how PAXLOVID works.  These are not the only medicines that may cause serious side effects if taken with PAXLOVID. PAXLOVID may increase or decrease the levels of multiple other medicines. It is very important to tell your healthcare provider about all of the medicines you are taking because additional laboratory tests or changes in the dose of your other medicines may be necessary while you are taking PAXLOVID. Your healthcare provider may also tell you about specific symptoms to watch out for that may indicate that you need to stop or decrease the dose of some of your other medicines.  What are the important possible side effects of PAXLOVID? Possible side effects  of PAXLOVID are: . Allergic Reactions. Allergic reactions can happen in people taking PAXLOVID, even after only 1 dose. Stop taking PAXLOVID and call your healthcare provider right away if you get any of the following symptoms of an allergic reaction: o hives o trouble swallowing or breathing o swelling of the mouth, lips, or face o throat tightness o hoarseness 3 Revised: 06 March 2021  o skin rash . Liver Problems. Tell your healthcare provider right away if you have any of these signs and symptoms of liver problems: loss of appetite, yellowing of your skin and the whites of eyes (jaundice), dark-colored urine, pale colored stools and itchy skin, stomach area (abdominal) pain. Marland Kitchen Resistance to HIV Medicines. If you have untreated HIV infection, PAXLOVID may lead to some HIV medicines not working as well in the future. . Other possible side effects include: o altered sense of taste o diarrhea o high blood pressure o muscle aches These are not all the possible side effects of PAXLOVID. Not many people have taken PAXLOVID. Serious and unexpected side effects may happen. PAXLOVID is still being studied,  so it is possible that all of the risks are not known at this time.  What other treatment choices are there? Veklury (remdesivir) is FDA-approved for the treatment of mild-to-moderate [id number]-63 in certain adults and children. Talk with your doctor to see if Marijean Heath is appropriate for you. Like PAXLOVID, FDA may also allow for the emergency use of other medicines to treat people with COVID-19. Go to https://price.info/ for information on the emergency use of other medicines that are authorized by FDA to treat people with COVID-19. Your healthcare provider may talk with you about clinical trials for which you may be eligible. It is your choice to be treated or not to be treated with  PAXLOVID. Should you decide not to receive it or for your child not to receive it, it will not change your standard medical care.  What if I am pregnant or breastfeeding? There is no experience treating pregnant women or breastfeeding mothers with PAXLOVID. For a mother and unborn baby, the benefit of taking PAXLOVID may be greater than the risk from the treatment. If you are pregnant, discuss your options and specific situation with your healthcare provider. It is recommended that you use effective barrier contraception or do not have sexual activity while taking PAXLOVID. If you are breastfeeding, discuss your options and specific situation with your healthcare provider. 4 Revised: 06 March 2021   How do I report side effects with PAXLOVID? Contact your healthcare provider if you have any side effects that bother you or do not go away. Report side effects to FDA MedWatch at SmoothHits.hu or call 1-800-FDA1088 or you can report side effects to [employer]. at the contact information provided below. Website Fax number Telephone number www.pfizersafetyreporting.com [phone] How should I store [place]? Store PAXLOVID tablets at room temperature, between 68?F to 77?F (20?C to 25?C). How can I learn more about COVID-19? Marland Kitchen Ask your healthcare provider. . Visit [email or web address] Minette Brine your local or state public health department. What is an Emergency Use Authorization (EUA)? The [place] FDA has made PAXLOVID available under an emergency access mechanism called an Emergency Use Authorization (EUA). The EUA is supported by a Education officer, museum and Human Service (HHS) declaration that circumstances exist to justify the emergency use of drugs and biological products during the COVID-19 pandemic. PAXLOVID for the treatment of mild-to-moderate COVID-19 in adults and children [16 years of age and older weighing at least 39 pounds (65 kg)] with  positive results of direct SARS-CoV-2 viral testing, and who are at high risk for progression to severe COVID-19, including hospitalization or death, has not undergone the same type of review as an FDA-approved product. In issuing an EUA under the NOMVE-72 public health emergency, the FDA has determined, among other things, that based on the total amount of scientific evidence available including data from adequate and well-controlled clinical trials, if available, it is reasonable to believe that the product may be effective for diagnosing, treating, or preventing COVID-19, or a serious or life-threatening disease or condition caused by COVID-19; that the known and potential benefits of the product, when used to diagnose, treat, or prevent such disease or condition, outweigh the known and potential risks of such product; and that there are no adequate, approved, and available alternatives. All of these criteria must be met to allow for the product to be used in the treatment of patients during the COVID-19 pandemic. The EUA for PAXLOVID is in effect for the duration of the COVID-19 declaration justifying emergency use  of this product, unless terminated or revoked (after which the products may no longer be used under the EUA). 5 Revised: 06 March 2021     Additional Information For general questions, visit the website or call the telephone number provided below. Website Telephone number www.COVID19oralRx.com ([phone] (1-877-C19-PACK) You can also go to www.pfizermedinfo.com or call [phone] for more information. [id number] Revised: 06 March 2021

## 2021-04-30 ENCOUNTER — Encounter: Payer: Federal, State, Local not specified - PPO | Admitting: Family Medicine

## 2021-05-06 ENCOUNTER — Encounter: Payer: Self-pay | Admitting: Family Medicine

## 2021-06-13 ENCOUNTER — Other Ambulatory Visit: Payer: Self-pay | Admitting: Family Medicine

## 2021-07-16 DIAGNOSIS — G4733 Obstructive sleep apnea (adult) (pediatric): Secondary | ICD-10-CM | POA: Diagnosis not present

## 2021-07-31 ENCOUNTER — Telehealth: Payer: Self-pay

## 2021-07-31 NOTE — Telephone Encounter (Signed)
Patient is scheduled for Tuesday.   Nurse Assessment Nurse: Alvester Morin RN, Marcelino Duster Date/Time (Eastern Time): 07/31/2021 12:19:18 PM Confirm and document reason for call. If symptomatic, describe symptoms. ---Caller has a headache and he has been having trouble with his blood pressure. BP has been running 130/100 a couple weeks ago, was 150/108 at one time. 135/107 at one time. Did not have the record in front of him at time of call. Is not on meds currently. Started 2 weeks ago and has had a history of high BP. Was on meds 3-5 meds ago. Does the patient have any new or worsening symptoms? ---Yes Will a triage be completed? ---Yes Related visit to physician within the last 2 weeks? ---No Does the PT have any chronic conditions? (i.e. diabetes, asthma, this includes High risk factors for pregnancy, etc.) ---Yes List chronic conditions. ---Hypertension, gout Is this a behavioral health or substance abuse call? ---No Guidelines Guideline Title Affirmed Question Affirmed Notes Nurse Date/Time (Eastern Time) Blood Pressure - High Systolic BP >= 160 OR Diastolic >= 100 Bell, RN, Marcelino Duster 07/31/2021 12:22:34 PM Disp. Time Lamount Cohen Time) Disposition Final User PLEASE NOTE: All timestamps contained within this report are represented as [place] Standard Time. CONFIDENTIALTY NOTICE: This fax transmission is intended only for the addressee. It contains information that is legally privileged, confidential or otherwise protected from use or disclosure. If you are not the intended recipient, you are strictly prohibited from reviewing, disclosing, copying using or disseminating any of this information or taking any action in reliance on or regarding this information. If you have received this fax in error, please notify us immediately by telephone so that we can arrange for its return to [place]. Phone: [phone], Toll-Free: [phone], Fax: [phone] Page: 2 of 2 Call Id: [id number] 07/31/2021  12:28:51 PM SEE PCP WITHIN 3 DAYS Yes Alvester Morin, RN, Lavon Paganini Disagree/Comply Comply Caller Understands Yes PreDisposition Call Doctor Care Advice Given Per Guideline SEE PCP WITHIN 3 DAYS: * You need to be seen within 2 or 3 days. * PCP VISIT: Call your doctor (or NP/PA) during regular office hours and make an appointment. A clinic or urgent care center are good places to go for care if your doctor's office is closed or you can't get an appointment. NOTE: If office will be open tomorrow, tell caller to call then, not in 3 days. CARE ADVICE given per High Blood Pressure (Adult) guideline. CALL BACK IF: * You become worse Comments User: Len Childs, RN Date/Time Lamount Cohen Time): 07/31/2021 12:28:29 PM Advised caller to return call after 1 pm for apt scheduling. Referrals REFERRED TO PCP OFFICE

## 2021-08-04 ENCOUNTER — Encounter: Payer: Self-pay | Admitting: Family Medicine

## 2021-08-04 ENCOUNTER — Ambulatory Visit (INDEPENDENT_AMBULATORY_CARE_PROVIDER_SITE_OTHER): Payer: Federal, State, Local not specified - PPO | Admitting: Family Medicine

## 2021-08-04 ENCOUNTER — Other Ambulatory Visit: Payer: Self-pay

## 2021-08-04 VITALS — BP 137/94 | HR 77 | Temp 97.7°F | Ht 67.0 in | Wt 255.6 lb

## 2021-08-04 DIAGNOSIS — I10 Essential (primary) hypertension: Secondary | ICD-10-CM | POA: Diagnosis not present

## 2021-08-04 DIAGNOSIS — G5623 Lesion of ulnar nerve, bilateral upper limbs: Secondary | ICD-10-CM

## 2021-08-04 DIAGNOSIS — R739 Hyperglycemia, unspecified: Secondary | ICD-10-CM

## 2021-08-04 DIAGNOSIS — M1000 Idiopathic gout, unspecified site: Secondary | ICD-10-CM | POA: Diagnosis not present

## 2021-08-04 NOTE — Patient Instructions (Signed)
It was very nice to see you today!  Please keep an eye on your blood pressure and let me know if it is persistently 140/90 or higher or if you have any return your symptoms.  We will not make any medication changes today.  We may need to start losartan for blood pressure becomes elevated again.  I think you probably have ulnar neuropathy.  Your physical we can check blood work.  I like to see you back soon for your physical.  Take care, Dr [name]  PLEASE NOTE:  If you had any lab tests please let us know if you have not heard back within a few days. You may see your results on mychart before we have a chance to review them but we will give you a call once they are reviewed by [place]. If we ordered any referrals today, please let us know if you have not heard from their office within the next week.   Please try these tips to maintain a healthy lifestyle:  Eat at least 3 REAL meals and 1-2 snacks per day.  Aim for no more than 5 hours between eating.  If you eat breakfast, please do so within one hour of getting up.   Each meal should contain half fruits/vegetables, one quarter protein, and one quarter carbs (no bigger than a computer mouse)  Cut down on sweet beverages. This includes juice, soda, and sweet tea.   Drink at least 1 glass of water with each meal and aim for at least 8 glasses per day  Exercise at least 150 minutes every week.

## 2021-08-04 NOTE — Assessment & Plan Note (Signed)
Very slightly above goal.  He has had significant improvement in his numbers since reducing sodium intake and making other lifestyle modifications.  We will continue with watchful waiting for now and he will monitor his blood pressure at home for the next several weeks.  If he has persistent readings in the 140s over 90s would consider restarting losartan 50 mg daily.  He will come back soon for CPE and we can recheck blood pressure at that time.

## 2021-08-04 NOTE — Assessment & Plan Note (Signed)
Doing well.  On allopurinol for 50 mg daily.  Use colchicine as needed.  We can check uric acid level when he comes back in for his physical.

## 2021-08-04 NOTE — Assessment & Plan Note (Signed)
We will check A1c when he comes back in for CPE. ?

## 2021-08-04 NOTE — Assessment & Plan Note (Signed)
Patient would like to come back to have blood work done before being referred to sports medicine orthopedics.  We will need to check CBC, c-Met, TSH, B12, and A1c.  He has tried splinting in the past which did not work.  He is not interested in injections or surgery at this time.

## 2021-08-04 NOTE — Progress Notes (Signed)
   Gerald Palmer is a 45 y.o. male who presents today for an office visit.  Assessment/Plan:  Chronic Problems Addressed Today: Essential hypertension Very slightly above goal.  He has had significant improvement in his numbers since reducing sodium intake and making other lifestyle modifications.  We will continue with watchful waiting for now and he will monitor his blood pressure at home for the next several weeks.  If he has persistent readings in the 140s over 90s would consider restarting losartan 50 mg daily.  He will come back soon for CPE and we can recheck blood pressure at that time.  Hyperglycemia We will check A1c when he comes back in for CPE.  Ulnar neuropathy of both upper extremities Patient would like to come back to have blood work done before being referred to sports medicine orthopedics.  We will need to check CBC, c-Met, TSH, B12, and A1c.  He has tried splinting in the past which did not work.  He is not interested in injections or surgery at this time.  Gout Doing well.  On allopurinol for 50 mg daily.  Use colchicine as needed.  We can check uric acid level when he comes back in for his physical.     Subjective:  HPI:  See A/P for status of chronic conditions.  He is concerned about possible elevated blood pressure readings.  He has developed headaches over the last several weeks.  Home blood pressure readings have been in the 140s and 150s over 90s.  About 5 days ago he made changes to his diet and lifestyle and has had improvement in headache.  Blood pressure readings at home over that time have been in the 130s over 80s.  He also has intermittent numbness and tingling in his bilateral hands.  Worse with certain activities such as playing video games or giving massages.       Objective:  Physical Exam: BP (!) 137/94   Pulse 77   Temp 97.7 F (36.5 C) (Temporal)   Ht 5' 7" (1.702 m)   Wt 255 lb 9.6 oz (115.9 kg)   SpO2 98%   BMI 40.03 kg/m   Gen: No  acute distress, resting comfortably CV: Regular rate and rhythm with no murmurs appreciated Pulm: Normal work of breathing, clear to auscultation bilaterally with no crackles, wheezes, or rhonchi MSK: Tinel's sign mildly positive at bilateral wrist.  Positive bilateral medial epicondyles.  Good distal cap refill bilaterally. Neuro: Grossly normal, moves all extremities Psych: Normal affect and thought content      Caleb M. Parker, MD 08/04/2021 10:51 AM   

## 2021-08-17 ENCOUNTER — Other Ambulatory Visit: Payer: Self-pay

## 2021-08-17 ENCOUNTER — Encounter (HOSPITAL_BASED_OUTPATIENT_CLINIC_OR_DEPARTMENT_OTHER): Payer: Self-pay | Admitting: *Deleted

## 2021-08-17 ENCOUNTER — Emergency Department (HOSPITAL_BASED_OUTPATIENT_CLINIC_OR_DEPARTMENT_OTHER): Payer: Federal, State, Local not specified - PPO

## 2021-08-17 ENCOUNTER — Emergency Department (HOSPITAL_BASED_OUTPATIENT_CLINIC_OR_DEPARTMENT_OTHER)
Admission: EM | Admit: 2021-08-17 | Discharge: 2021-08-17 | Disposition: A | Discharge status: Home / Self Care | Payer: Federal, State, Local not specified - PPO | Attending: Emergency Medicine | Admitting: Emergency Medicine

## 2021-08-17 DIAGNOSIS — Z79899 Other long term (current) drug therapy: Secondary | ICD-10-CM | POA: Insufficient documentation

## 2021-08-17 DIAGNOSIS — Z87891 Personal history of nicotine dependence: Secondary | ICD-10-CM | POA: Diagnosis not present

## 2021-08-17 DIAGNOSIS — Z20822 Contact with and (suspected) exposure to covid-19: Secondary | ICD-10-CM | POA: Insufficient documentation

## 2021-08-17 DIAGNOSIS — J4541 Moderate persistent asthma with (acute) exacerbation: Secondary | ICD-10-CM | POA: Diagnosis not present

## 2021-08-17 DIAGNOSIS — I10 Essential (primary) hypertension: Secondary | ICD-10-CM | POA: Diagnosis not present

## 2021-08-17 DIAGNOSIS — R059 Cough, unspecified: Secondary | ICD-10-CM | POA: Diagnosis not present

## 2021-08-17 DIAGNOSIS — B9789 Other viral agents as the cause of diseases classified elsewhere: Secondary | ICD-10-CM | POA: Diagnosis not present

## 2021-08-17 DIAGNOSIS — J069 Acute upper respiratory infection, unspecified: Secondary | ICD-10-CM | POA: Insufficient documentation

## 2021-08-17 DIAGNOSIS — R0602 Shortness of breath: Secondary | ICD-10-CM | POA: Diagnosis not present

## 2021-08-17 LAB — RESP PANEL BY RT-PCR (FLU A&B, COVID) ARPGX2
Influenza A by PCR: NEGATIVE
Influenza B by PCR: NEGATIVE
SARS Coronavirus 2 by RT PCR: NEGATIVE

## 2021-08-17 MED ORDER — ALBUTEROL SULFATE HFA 108 (90 BASE) MCG/ACT IN AERS
4.0000 | INHALATION_SPRAY | Freq: Once | RESPIRATORY_TRACT | Status: AC
  Administered 2021-08-17: 4 via RESPIRATORY_TRACT
  Filled 2021-08-17: qty 6.7

## 2021-08-17 MED ORDER — PREDNISONE 20 MG PO TABS: 40.0000 mg | ORAL_TABLET | Freq: Every day | ORAL | 0 refills | Status: AC

## 2021-08-17 MED ORDER — DEXAMETHASONE 6 MG PO TABS
10.0000 mg | ORAL_TABLET | Freq: Once | ORAL | Status: AC
  Administered 2021-08-17: 10 mg via ORAL
  Filled 2021-08-17: qty 1

## 2021-08-17 MED ORDER — AEROCHAMBER PLUS FLO-VU MEDIUM MISC
1.0000 | Freq: Once | Status: AC
  Administered 2021-08-17: 1
  Filled 2021-08-17: qty 1

## 2021-08-17 MED ORDER — ALBUTEROL SULFATE HFA 108 (90 BASE) MCG/ACT IN AERS: 2.0000 | INHALATION_SPRAY | RESPIRATORY_TRACT | 0 refills | Status: DC | PRN

## 2021-08-17 NOTE — ED Triage Notes (Signed)
Pt c/o sob, cough, and congestion since Thursday. Has taken 3 covid test at home which were negative. Hx of asthma. Sats 99%. Took Robtussion and tylenol this morning pta.

## 2021-08-17 NOTE — ED Notes (Signed)
RT educated pt on proper use of aero chamber/MDI. Pt able to demonstrate proper use for RT.

## 2021-08-17 NOTE — Discharge Instructions (Addendum)
Your COVID test is negative.  Take the albuterol inhaler as needed.  Take the course of steroids.  Recommend recheck with your primary doctor sometime this week.  Come back to ER if you develop worsening difficulty breathing, or other new concerning symptom.

## 2021-08-17 NOTE — ED Provider Notes (Signed)
[facility] EMERGENCY DEPT Provider Note   CSN: [id number] Arrival date & time: 08/17/21  0540     History Chief Complaint  Patient presents with   Shortness of Breath    Gerald Palmer is a 45 y.o. male.  HPI     This is a 45 year old male with a history of hypertension and asthma who presents with upper respiratory congestion.  Patient reports onset of symptoms on Thursday.  He reports runny nose, cough, some increased shortness of breath.  No fevers that he is noted.  No known sick contacts or COVID exposures.  He has received his first 2 vaccines.  Patient has been taking Robitussin and Tylenol with some relief at home.  He is concerned for how long his symptoms have lasted.  He has had multiple negative COVID test.  Denies chest pain, nausea, vomiting, abdominal pain.  Past Medical History:  Diagnosis Date   Asthma    Hypertension     Patient Active Problem List   Diagnosis Date Noted   Essential hypertension 08/04/2021   Ulnar neuropathy of both upper extremities 08/04/2021   Meralgia paresthetica 10/05/2019   Carpal tunnel syndrome, bilateral 08/14/2019   Severe obstructive sleep apnea-hypopnea syndrome 05/15/2019   Dyslipidemia 08/08/2018   Hyperglycemia 08/08/2018   Depression, major, single episode, mild (HCC) 08/07/2018   Gout 03/21/2018   Knee pain 12/30/2017   Allergic rhinitis 11/14/2017   Asthma 10/11/2017    Past Surgical History:  Procedure Laterality Date   ACHILLES TENDON SURGERY     ANKLE FRACTURE SURGERY Right    TONSILLECTOMY         Family History  Problem Relation Age of Onset   Hypertension Mother    Arthritis Mother    Diabetes Mother    Hyperlipidemia Mother    Asthma Father    Hyperlipidemia Father    Hypertension Father    Stroke Father    Hypertension Brother    Hyperlipidemia Brother    Arthritis Maternal Grandmother     Social History   Tobacco Use   Smoking status: Former   Smokeless tobacco: Never   Building services engineer Use: Never used  Substance Use Topics   Alcohol use: Yes    Comment: occasionally   Drug use: No    Home Medications Prior to Admission medications   Medication Sig Start Date End Date Taking? Authorizing Provider  albuterol (PROVENTIL) (2.5 MG/3ML) 0.083% nebulizer solution Take 3 mLs (2.5 mg total) by nebulization every 6 (six) hours as needed for wheezing or shortness of breath. 12/11/20   Ardith Dark, MD  albuterol (VENTOLIN HFA) 108 (90 Base) MCG/ACT inhaler INHALE 2 PUFFS INTO THE LUNGS EVERY 4 TO 6 HOURS AS NEEDED FOR COUGH OR WHEEZING 06/15/21   Ardith Dark, MD  allopurinol (ZYLOPRIM) 300 MG tablet Take 1.5 tablets (450 mg total) by mouth daily. 03/17/21   Rodolph Bong, MD  atorvastatin (LIPITOR) 40 MG tablet TAKE 1 TABLET(40 MG) BY MOUTH DAILY 05/29/20   Ardith Dark, MD  azelastine (ASTELIN) 0.1 % nasal spray Place 2 sprays into both nostrils 2 (two) times daily. 02/06/20   Ardith Dark, MD  budesonide-formoterol [facility]) 160-4.5 MCG/ACT inhaler INHALE 2 PUFFS BY MOUTH TWICE DAILY 04/29/21   Ardith Dark, MD  cetirizine (ZYRTEC) 10 MG tablet Take 1 tablet (10 mg total) by mouth daily. 03/15/19   Ardith Dark, MD  colchicine 0.6 MG tablet TAKE 1 TABLET(0.6 MG) BY MOUTH DAILY  AS NEEDED FOR GOUT 01/13/21   Ardith Dark, MD  gabapentin (NEURONTIN) 100 MG capsule Take 1 capsule (100 mg total) by mouth 3 (three) times daily. 02/06/21   Ardith Dark, MD  montelukast (SINGULAIR) 10 MG tablet Take one tablet once daily 04/15/20   Ardith Dark, MD    Allergies    Aspirin, Ibuprofen, Nsaids, and Shellfish allergy  Review of Systems   Review of Systems  Constitutional:  Negative for fever.  HENT:  Positive for congestion and rhinorrhea.   Respiratory:  Positive for cough and shortness of breath.   Cardiovascular:  Negative for chest pain.  Gastrointestinal:  Negative for abdominal pain, diarrhea and vomiting.  All other systems reviewed and  are negative.  Physical Exam Updated Vital Signs BP (!) 147/96   Pulse 75   Temp 98.6 F (37 C)   Resp 20   Ht 1.702 m (5\' 7" )   Wt 113.4 kg   SpO2 99%   BMI 39.16 kg/m   Physical Exam Vitals and nursing note reviewed.  Constitutional:      Appearance: He is well-developed. He is not ill-appearing.  HENT:     Head: Normocephalic and atraumatic.     Nose: Congestion present.  Eyes:     Pupils: Pupils are equal, round, and reactive to light.  Cardiovascular:     Rate and Rhythm: Normal rate and regular rhythm.     Heart sounds: Normal heart sounds. No murmur heard. Pulmonary:     Effort: Pulmonary effort is normal. No respiratory distress.     Breath sounds: Wheezing present.     Comments: Speaking in full sentences, no respiratory distress, diffuse expiratory wheeze Abdominal:     General: Bowel sounds are normal.     Palpations: Abdomen is soft.     Tenderness: There is no abdominal tenderness. There is no rebound.  Musculoskeletal:     Cervical back: Neck supple.  Lymphadenopathy:     Cervical: No cervical adenopathy.  Skin:    General: Skin is warm and dry.  Neurological:     Mental Status: He is alert and oriented to person, place, and time.  Psychiatric:        Mood and Affect: Mood normal.    ED Results / Procedures / Treatments   Labs (all labs ordered are listed, but only abnormal results are displayed) Labs Reviewed  RESP PANEL BY RT-PCR (FLU A&B, COVID) [id number]    EKG EKG Interpretation  Date/Time:  Monday August 17 2021 06:03:15 EDT Ventricular Rate:  83 PR Interval:  151 QRS Duration: 80 QT Interval:  395 QTC Calculation: 465 R Axis:   37 Text Interpretation: Sinus rhythm Confirmed by [id number] (Ross Marcus) on 08/17/2021 6:14:28 AM  Radiology No results found.  Procedures Procedures   Medications Ordered in ED Medications  albuterol (VENTOLIN HFA) 108 (90 Base) MCG/ACT inhaler 4 puff (has no administration in time range)   dexamethasone (DECADRON) tablet 10 mg (has no administration in time range)    ED Course  I have reviewed the triage vital signs and the nursing notes.  Pertinent labs & imaging results that were available during my care of the patient were reviewed by me and considered in my medical decision making (see chart for details).    MDM Rules/Calculators/A&P                           Patient presents with upper respiratory symptoms,  cough, shortness of breath.  He is overall nontoxic and vital signs are reassuring.  He is in no respiratory distress.  He does have some wheezing on exam.  Suspect bronchospasm related to upper respiratory illness.  Highly suspect viral in nature.  Patient was given HFA and Decadron.  We will obtain an x-ray to ensure no bacterial pneumonia.  COVID testing sent.  EKG normal without acute ischemic or arrhythmic changes.  Favor infectious etiology and doubt ACS.  Patient signed out to oncoming provider. Final Clinical Impression(s) / ED Diagnoses Final diagnoses:  Moderate persistent asthma with exacerbation  Viral URI with cough    Rx / DC Orders ED Discharge Orders     None        Windle Huebert, Mayer Masker, MD 08/17/21 [phone]

## 2021-08-19 ENCOUNTER — Telehealth (INDEPENDENT_AMBULATORY_CARE_PROVIDER_SITE_OTHER): Payer: Federal, State, Local not specified - PPO | Admitting: Adult Health

## 2021-08-19 ENCOUNTER — Encounter: Payer: Self-pay | Admitting: Adult Health

## 2021-08-19 VITALS — Ht 67.0 in | Wt 250.0 lb

## 2021-08-19 DIAGNOSIS — J988 Other specified respiratory disorders: Secondary | ICD-10-CM | POA: Diagnosis not present

## 2021-08-19 MED ORDER — DOXYCYCLINE HYCLATE 100 MG PO CAPS: 100.0000 mg | ORAL_CAPSULE | Freq: Two times a day (BID) | ORAL | 0 refills | Status: DC

## 2021-08-19 NOTE — Progress Notes (Signed)
Virtual Visit via Video Note  I connected with Gerald Palmer on 08/19/21 at 11:00 AM EDT by a video enabled telemedicine application and verified that I am speaking with the correct person using two identifiers.  Location patient: home Location provider:work or home office Persons participating in the virtual visit: patient, provider  I discussed the limitations of evaluation and management by telemedicine and the availability of in person appointments. The patient expressed understanding and agreed to proceed.   HPI: 45 year old male who  has a past medical history of Asthma and Hypertension.  He is being evaluated today for an acute issue.  Was seen 2 days ago in the emergency room.  His symptoms started roughly 4 days earlier.  At this time he reported runny nose, productive cough, increased shortness of breath.  He denied fevers, chest pain, nausea, vomiting, or abdominal pain.  Work-up including EKG and chest x-ray are unremarkable.  It was thought that his symptoms were more viral upper respiratory in nature or an asthma exacerbation.  He was given HFA and prednisone 40 mg daily x5 days.  He reports today that normally during his acute asthma exacerbations prednisone will resolve his symptoms.  He states today "this feels different".  Symptoms include productive cough with green mucus, shortness of breath with exertion, wheezing, and rhinorrhea with clear drainage.  He has not had any fevers, or chills.  Has been using his nebulizer and inhalers as directed without relief.  Not feel as though his symptoms are getting better possibly getting worse.  At home he has been using Robitussin with little relief   ROS: See pertinent positives and negatives per HPI.  Past Medical History:  Diagnosis Date   Asthma    Hypertension     Past Surgical History:  Procedure Laterality Date   ACHILLES TENDON SURGERY     ANKLE FRACTURE SURGERY Right    TONSILLECTOMY      Family History   Problem Relation Age of Onset   Hypertension Mother    Arthritis Mother    Diabetes Mother    Hyperlipidemia Mother    Asthma Father    Hyperlipidemia Father    Hypertension Father    Stroke Father    Hypertension Brother    Hyperlipidemia Brother    Arthritis Maternal Grandmother        Current Outpatient Medications:    albuterol (PROVENTIL) (2.5 MG/3ML) 0.083% nebulizer solution, Take 3 mLs (2.5 mg total) by nebulization every 6 (six) hours as needed for wheezing or shortness of breath., Disp: 75 mL, Rfl: 1   albuterol (VENTOLIN HFA) 108 (90 Base) MCG/ACT inhaler, Inhale 2 puffs into the lungs every 4 (four) hours as needed for wheezing or shortness of breath., Disp: 8 g, Rfl: 0   allopurinol (ZYLOPRIM) 300 MG tablet, Take 1.5 tablets (450 mg total) by mouth daily., Disp: 135 tablet, Rfl: 3   atorvastatin (LIPITOR) 40 MG tablet, TAKE 1 TABLET(40 MG) BY MOUTH DAILY, Disp: 30 tablet, Rfl: 3   azelastine (ASTELIN) 0.1 % nasal spray, Place 2 sprays into both nostrils 2 (two) times daily., Disp: 30 mL, Rfl: 12   budesonide-formoterol (SYMBICORT) 160-4.5 MCG/ACT inhaler, INHALE 2 PUFFS BY MOUTH TWICE DAILY, Disp: 30.6 g, Rfl: 1   cetirizine (ZYRTEC) 10 MG tablet, Take 1 tablet (10 mg total) by mouth daily., Disp: 30 tablet, Rfl: 11   colchicine 0.6 MG tablet, TAKE 1 TABLET(0.6 MG) BY MOUTH DAILY AS NEEDED FOR GOUT, Disp: 30 tablet, Rfl: 5  doxycycline (VIBRAMYCIN) 100 MG capsule, Take 1 capsule (100 mg total) by mouth 2 (two) times daily., Disp: 14 capsule, Rfl: 0   gabapentin (NEURONTIN) 100 MG capsule, Take 1 capsule (100 mg total) by mouth 3 (three) times daily., Disp: 90 capsule, Rfl: 0   montelukast (SINGULAIR) 10 MG tablet, Take one tablet once daily, Disp: 90 tablet, Rfl: 3   predniSONE (DELTASONE) 20 MG tablet, Take 2 tablets (40 mg total) by mouth daily for 5 days., Disp: 10 tablet, Rfl: 0  EXAM:  VITALS per patient if applicable:  GENERAL: alert, oriented, appears well  and in no acute distress  HEENT: atraumatic, conjunttiva clear, no obvious abnormalities on inspection of external nose and ears  NECK: normal movements of the head and neck  LUNGS: on inspection no signs of respiratory distress, breathing rate appears normal, no obvious gross SOB, gasping or wheezing  CV: no obvious cyanosis  MS: moves all visible extremities without noticeable abnormality  PSYCH/NEURO: pleasant and cooperative, no obvious depression or anxiety, speech and thought processing grossly intact  ASSESSMENT AND PLAN:  Discussed the following assessment and plan:  1. Respiratory infection -We will treat for possible starting of bacterial infection.  Continue with inhalers and prednisone.  Follow-up with his PCP if not improving over the next couple of days. - doxycycline (VIBRAMYCIN) 100 MG capsule; Take 1 capsule (100 mg total) by mouth 2 (two) times daily.  Dispense: 14 capsule; Refill: 0   I discussed the assessment and treatment plan with the patient. The patient was provided an opportunity to ask questions and all were answered. The patient agreed with the plan and demonstrated an understanding of the instructions.   The patient was advised to call back or seek an in-person evaluation if the symptoms worsen or if the condition fails to improve as anticipated.   Shirline Frees, NP

## 2021-08-20 ENCOUNTER — Encounter: Payer: Self-pay | Admitting: Adult Health

## 2021-08-31 ENCOUNTER — Other Ambulatory Visit: Payer: Self-pay

## 2021-08-31 ENCOUNTER — Encounter: Payer: Self-pay | Admitting: Family Medicine

## 2021-08-31 ENCOUNTER — Ambulatory Visit (INDEPENDENT_AMBULATORY_CARE_PROVIDER_SITE_OTHER): Payer: Federal, State, Local not specified - PPO | Admitting: Family Medicine

## 2021-08-31 VITALS — BP 134/86 | HR 72 | Temp 98.2°F | Ht 67.0 in | Wt 253.4 lb

## 2021-08-31 DIAGNOSIS — E669 Obesity, unspecified: Secondary | ICD-10-CM

## 2021-08-31 DIAGNOSIS — G5623 Lesion of ulnar nerve, bilateral upper limbs: Secondary | ICD-10-CM

## 2021-08-31 DIAGNOSIS — G5603 Carpal tunnel syndrome, bilateral upper limbs: Secondary | ICD-10-CM

## 2021-08-31 DIAGNOSIS — Z23 Encounter for immunization: Secondary | ICD-10-CM

## 2021-08-31 DIAGNOSIS — J4540 Moderate persistent asthma, uncomplicated: Secondary | ICD-10-CM

## 2021-08-31 DIAGNOSIS — E785 Hyperlipidemia, unspecified: Secondary | ICD-10-CM | POA: Diagnosis not present

## 2021-08-31 DIAGNOSIS — R739 Hyperglycemia, unspecified: Secondary | ICD-10-CM | POA: Diagnosis not present

## 2021-08-31 DIAGNOSIS — M1000 Idiopathic gout, unspecified site: Secondary | ICD-10-CM | POA: Diagnosis not present

## 2021-08-31 DIAGNOSIS — Z0001 Encounter for general adult medical examination with abnormal findings: Secondary | ICD-10-CM

## 2021-08-31 DIAGNOSIS — Z6839 Body mass index (BMI) 39.0-39.9, adult: Secondary | ICD-10-CM

## 2021-08-31 DIAGNOSIS — I10 Essential (primary) hypertension: Secondary | ICD-10-CM

## 2021-08-31 LAB — COMPREHENSIVE METABOLIC PANEL
ALT: 31 U/L (ref 0–53)
AST: 22 U/L (ref 0–37)
Albumin: 4.1 g/dL (ref 3.5–5.2)
Alkaline Phosphatase: 72 U/L (ref 39–117)
BUN: 15 mg/dL (ref 6–23)
CO2: 28 mEq/L (ref 19–32)
Calcium: 9.3 mg/dL (ref 8.4–10.5)
Chloride: 104 mEq/L (ref 96–112)
Creatinine, Ser: 1.31 mg/dL (ref 0.40–1.50)
GFR: 66.69 mL/min (ref >60.00)
Glucose, Bld: 78 mg/dL (ref 70–99)
Potassium: 3.8 mEq/L (ref 3.5–5.1)
Sodium: 141 mEq/L (ref 135–145)
Total Bilirubin: 0.4 mg/dL (ref 0.2–1.2)
Total Protein: 6.3 g/dL (ref 6.0–8.3)

## 2021-08-31 LAB — HEMOGLOBIN A1C: Hgb A1c MFr Bld: 6.4 % (ref 4.6–6.5)

## 2021-08-31 LAB — LIPID PANEL
Cholesterol: 176 mg/dL (ref 0–200)
HDL: 31.4 mg/dL — ABNORMAL LOW (ref >39.00)
NonHDL: 144.43
Total CHOL/HDL Ratio: 6
Triglycerides: 255 mg/dL — ABNORMAL HIGH (ref 0.0–149.0)
VLDL: 51 mg/dL — ABNORMAL HIGH (ref 0.0–40.0)

## 2021-08-31 LAB — CBC
HCT: 41.5 % (ref 39.0–52.0)
Hemoglobin: 13.6 g/dL (ref 13.0–17.0)
MCHC: 32.6 g/dL (ref 30.0–36.0)
MCV: 84.9 fl (ref 78.0–100.0)
Platelets: 306 10*3/uL (ref 150.0–400.0)
RBC: 4.89 Mil/uL (ref 4.22–5.81)
RDW: 14.4 % (ref 11.5–15.5)
WBC: 8.2 10*3/uL (ref 4.0–10.5)

## 2021-08-31 LAB — TSH: TSH: 2.43 u[IU]/mL (ref 0.35–5.50)

## 2021-08-31 LAB — LDL CHOLESTEROL, DIRECT: Direct LDL: 106 mg/dL

## 2021-08-31 LAB — VITAMIN B12: Vitamin B-12: 283 pg/mL (ref 211–911)

## 2021-08-31 LAB — URIC ACID: Uric Acid, Serum: 9 mg/dL — ABNORMAL HIGH (ref 4.0–7.8)

## 2021-08-31 MED ORDER — ATORVASTATIN CALCIUM 40 MG PO TABS: ORAL_TABLET | ORAL | 3 refills | Status: DC

## 2021-08-31 MED ORDER — ALLOPURINOL 300 MG PO TABS: 450.0000 mg | ORAL_TABLET | Freq: Every day | ORAL | 3 refills | Status: DC

## 2021-08-31 MED ORDER — ALBUTEROL SULFATE HFA 108 (90 BASE) MCG/ACT IN AERS: 2.0000 | INHALATION_SPRAY | RESPIRATORY_TRACT | 0 refills | Status: DC | PRN

## 2021-08-31 MED ORDER — GABAPENTIN 100 MG PO CAPS: 100.0000 mg | ORAL_CAPSULE | Freq: Three times a day (TID) | ORAL | 0 refills | Status: DC

## 2021-08-31 MED ORDER — MONTELUKAST SODIUM 10 MG PO TABS: ORAL_TABLET | ORAL | 3 refills | Status: AC

## 2021-08-31 MED ORDER — COLCHICINE 0.6 MG PO TABS: ORAL_TABLET | ORAL | 5 refills | Status: DC

## 2021-08-31 NOTE — Patient Instructions (Signed)
It was very nice to see you today!  We will check blood work.  We will get your flu shot today.  Please continue working on diet and exercise.  I will see you back in year.  Come back to see me sooner if needed.  Take care, Dr [name]  PLEASE NOTE:  If you had any lab tests please let us know if you have not heard back within a few days. You may see your results on mychart before we have a chance to review them but we will give you a call once they are reviewed by [place]. If we ordered any referrals today, please let us know if you have not heard from their office within the next week.   Please try these tips to maintain a healthy lifestyle:  Eat at least 3 REAL meals and 1-2 snacks per day.  Aim for no more than 5 hours between eating.  If you eat breakfast, please do so within one hour of getting up.   Each meal should contain half fruits/vegetables, one quarter protein, and one quarter carbs (no bigger than a computer mouse)  Cut down on sweet beverages. This includes juice, soda, and sweet tea.   Drink at least 1 glass of water with each meal and aim for at least 8 glasses per day  Exercise at least 150 minutes every week.    Preventive Care 50-38 Years Old, Male Preventive care refers to lifestyle choices and visits with your health care provider that can promote health and wellness. This includes: A yearly physical exam. This is also called an annual wellness visit. Regular dental and eye exams. Immunizations. Screening for certain conditions. Healthy lifestyle choices, such as: Eating a healthy diet. Getting regular exercise. Not using drugs or products that contain nicotine and tobacco. Limiting alcohol use. What can I expect for my preventive care visit? Physical exam Your health care provider will check your: Height and weight. These may be used to calculate your BMI (body mass index). BMI is a measurement that tells if you are at a healthy weight. Heart rate and blood  pressure. Body temperature. Skin for abnormal spots. Counseling Your health care provider may ask you questions about your: Past medical problems. Family's medical history. Alcohol, tobacco, and drug use. Emotional well-being. Home life and relationship well-being. Sexual activity. Diet, exercise, and sleep habits. Work and work Astronomer. Access to firearms. What immunizations do I need? Vaccines are usually given at various ages, according to a schedule. Your health care provider will recommend vaccines for you based on your age, medical history, and lifestyle or other factors, such as travel or where you work. What tests do I need? Blood tests Lipid and cholesterol levels. These may be checked every 5 years, or more often if you are over 55 years old. Hepatitis C test. Hepatitis B test. Screening Lung cancer screening. You may have this screening every year starting at age 38 if you have a 30-pack-year history of smoking and currently smoke or have quit within the past 15 years. Prostate cancer screening. Recommendations will vary depending on your family history and other risks. Genital exam to check for testicular cancer or hernias. Colorectal cancer screening. All adults should have this screening starting at age 22 and continuing until age 34. Your health care provider may recommend screening at age 34 if you are at increased risk. You will have tests every 1-10 years, depending on your results and the type of screening test. Diabetes screening.  This is done by checking your blood sugar (glucose) after you have not eaten for a while (fasting). You may have this done every 1-3 years. STD (sexually transmitted disease) testing, if you are at risk. Follow these instructions at home: Eating and drinking  Eat a diet that includes fresh fruits and vegetables, whole grains, lean protein, and low-fat dairy products. Take vitamin and mineral supplements as recommended by your  health care provider. Do not drink alcohol if your health care provider tells you not to drink. If you drink alcohol: Limit how much you have to 0-2 drinks a day. Be aware of how much alcohol is in your drink. In the [place] one drink equals one 12 oz bottle of beer (355 mL), one 5 oz glass of wine (148 mL), or one 1 oz glass of hard liquor (44 mL). Lifestyle Take daily care of your teeth and gums. Brush your teeth every morning and night with fluoride toothpaste. Floss one time each day. Stay active. Exercise for at least 30 minutes 5 or more days each week. Do not use any products that contain nicotine or tobacco, such as cigarettes, e-cigarettes, and chewing tobacco. If you need help quitting, ask your health care provider. Do not use drugs. If you are sexually active, practice safe sex. Use a condom or other form of protection to prevent STIs (sexually transmitted infections). If told by your health care provider, take low-dose aspirin daily starting at age 67. Find healthy ways to cope with stress, such as: Meditation, yoga, or listening to music. Journaling. Talking to a trusted person. Spending time with friends and family. Safety Always wear your seat belt while driving or riding in a vehicle. Do not drive: If you have been drinking alcohol. Do not ride with someone who has been drinking. When you are tired or distracted. While texting. Wear a helmet and other protective equipment during sports activities. If you have firearms in your house, make sure you follow all gun safety procedures. What's next? Go to your health care provider once a year for an annual wellness visit. Ask your health care provider how often you should have your eyes and teeth checked. Stay up to date on all vaccines. This information is not intended to replace advice given to you by your health care provider. Make sure you discuss any questions you have with your health care provider. Document Revised:  02/13/2021 Document Reviewed: 11/30/2018 Elsevier Patient Education  2022 [employer].

## 2021-08-31 NOTE — Assessment & Plan Note (Signed)
Worsened since last visit.  We will check labs including CBC, c-Met, TSH, B12, A1c.  May need referral to sports medicine if continues to be an issue.

## 2021-08-31 NOTE — Assessment & Plan Note (Signed)
On lipitor 40mg daily. Check lipids.  

## 2021-08-31 NOTE — Progress Notes (Signed)
Chief Complaint:  Gerald Palmer is a 45 y.o. male who presents today for his annual comprehensive physical exam.    Assessment/Plan:  Chronic Problems Addressed Today: Ulnar neuropathy of both upper extremities Worsened since last visit.  We will check labs including CBC, c-Met, TSH, B12, A1c.  May need referral to sports medicine if continues to be an issue.  Essential hypertension At goal.  Check labs.  Not on any medications.  Hyperglycemia Check A1c.  Dyslipidemia On lipitor 51m daily.  Check lipids.  Gout On allopurinol 450 mg daily and colchicine as needed.  Check uric acid level today.  Asthma Refilled albuterol.  Recently had a flareup but symptoms are now stable.   Body mass index is 39.69 kg/m. / Obesity  BMI Metric Follow Up - 08/31/21 1430       BMI Metric Follow Up-Please document annually   BMI Metric Follow Up Education provided             Preventative Healthcare: Check Labs. Flu vaccine given today.   Patient Counseling(The following topics were reviewed and/or handout was given):  -Nutrition: Stressed importance of moderation in sodium/caffeine intake, saturated fat and cholesterol, caloric balance, sufficient intake of fresh fruits, vegetables, and fiber.  -Stressed the importance of regular exercise.   -Substance Abuse: Discussed cessation/primary prevention of tobacco, alcohol, or other drug use; driving or other dangerous activities under the influence; availability of treatment for abuse.   -Injury prevention: Discussed safety belts, safety helmets, smoke detector, smoking near bedding or upholstery.   -Sexuality: Discussed sexually transmitted diseases, partner selection, use of condoms, avoidance of unintended pregnancy and contraceptive alternatives.   -Dental health: Discussed importance of regular tooth brushing, flossing, and dental visits.  -Health maintenance and immunizations reviewed. Please refer to Health maintenance  section.  Return to care in 1 year for next preventative visit.     Subjective:  HPI:  He has no acute complaints today.   He admits that he has not had any issues with gout in the past 2-3 months. Also, his breathing has returned to normal after recovering from an illness he had from a recent visit.  He believes that he had the flu for around two weeks, which resolved around 08/24/2021. He also admits that the numbness in his hands has worsened.  He is compliant with all medication with no notable side effects.   Lifestyle Diet: Can improve Exercise: Can improve  [facility] 08/31/[facility]  Decreased Interest 0  Down, Depressed, Hopeless 0  PHQ - 2 Score 0  Altered sleeping -  Tired, decreased energy -  Change in appetite -  Feeling bad or failure about yourself  -  Trouble concentrating -  Moving slowly or fidgety/restless -  Suicidal thoughts -  PHQ-9 Score -  Difficult doing work/chores -    Health Maintenance Due  Topic Date Due   Hepatitis C Screening  Never done   COVID-19 Vaccine (3 - Booster for Pfizer series) 09/02/2020   INFLUENZA VACCINE  07/20/2021     ROS: Per HPI, otherwise a complete review of systems was negative.   PMH:  The following were reviewed and entered/updated in epic: Past Medical History:  Diagnosis Date   Asthma    Hypertension    Patient Active Problem List   Diagnosis Date Noted   Essential hypertension 08/04/2021   Ulnar neuropathy of both upper extremities 08/04/2021   Meralgia paresthetica 10/05/2019   Carpal tunnel syndrome, bilateral 08/14/2019  Severe obstructive sleep apnea-hypopnea syndrome 05/15/2019   Dyslipidemia 08/08/2018   Hyperglycemia 08/08/2018   Depression, major, single episode, mild ([place]) 08/07/2018   Gout 03/21/2018   Knee pain 12/30/2017   Allergic rhinitis 11/14/2017   Asthma 10/11/2017   Past Surgical History:  Procedure Laterality Date   ACHILLES TENDON SURGERY     ANKLE FRACTURE  SURGERY Right    TONSILLECTOMY      Family History  Problem Relation Age of Onset   Hypertension Mother    Arthritis Mother    Diabetes Mother    Hyperlipidemia Mother    Asthma Father    Hyperlipidemia Father    Hypertension Father    Stroke Father    Hypertension Brother    Hyperlipidemia Brother    Arthritis Maternal Grandmother     Medications- reviewed and updated Current Outpatient Medications  Medication Sig Dispense Refill   albuterol (PROVENTIL) (2.5 MG/3ML) 0.083% nebulizer solution Take 3 mLs (2.5 mg total) by nebulization every 6 (six) hours as needed for wheezing or shortness of breath. 75 mL 1   azelastine (ASTELIN) 0.1 % nasal spray Place 2 sprays into both nostrils 2 (two) times daily. 30 mL 12   budesonide-formoterol (SYMBICORT) 160-4.5 MCG/ACT inhaler INHALE 2 PUFFS BY MOUTH TWICE DAILY 30.6 g 1   cetirizine (ZYRTEC) 10 MG tablet Take 1 tablet (10 mg total) by mouth daily. 30 tablet 11   albuterol (VENTOLIN HFA) 108 (90 Base) MCG/ACT inhaler Inhale 2 puffs into the lungs every 4 (four) hours as needed for wheezing or shortness of breath. 8 g 0   allopurinol (ZYLOPRIM) 300 MG tablet Take 1.5 tablets (450 mg total) by mouth daily. 135 tablet 3   atorvastatin (LIPITOR) 40 MG tablet TAKE 1 TABLET(40 MG) BY MOUTH DAILY 90 tablet 3   colchicine 0.6 MG tablet TAKE 1 TABLET(0.6 MG) BY MOUTH DAILY AS NEEDED FOR GOUT 30 tablet 5   gabapentin (NEURONTIN) 100 MG capsule Take 1 capsule (100 mg total) by mouth 3 (three) times daily. 90 capsule 0   montelukast (SINGULAIR) 10 MG tablet Take one tablet once daily 90 tablet 3   No current facility-administered medications for this visit.    Allergies-reviewed and updated Allergies  Allergen Reactions   Aspirin     unknown   Ibuprofen Hives    High doses cause hives   Nsaids Hives   Shellfish Allergy Itching    Eyes swell    Social History   Socioeconomic History   Marital status: Single    Spouse name: Not on file    Number of children: Not on file   Years of education: Not on file   Highest education level: Not on file  Occupational History   Not on file  Tobacco Use   Smoking status: Former   Smokeless tobacco: Never  Vaping Use   Vaping Use: Never used  Substance and Sexual Activity   Alcohol use: Yes    Comment: occasionally   Drug use: No   Sexual activity: Yes  Other Topics Concern   Not on file  Social History Narrative   Not on file   Social Determinants of Health   Financial Resource Strain: Not on file  Food Insecurity: Not on file  Transportation Needs: Not on file  Physical Activity: Not on file  Stress: Not on file  Social Connections: Not on file        Objective:  Physical Exam: BP 134/86   Pulse 72   Temp  98.2 F (36.8 C) (Temporal)   Ht '5\' 7"'  (1.702 m)   Wt 253 lb 6.4 oz (114.9 kg)   SpO2 98%   BMI 39.69 kg/m   Body mass index is 39.69 kg/m. Wt Readings from Last 3 Encounters:  08/31/21 253 lb 6.4 oz (114.9 kg)  08/19/21 250 lb (113.4 kg)  08/17/21 250 lb (113.4 kg)   Gen: NAD, resting comfortably HEENT: TMs normal bilaterally. OP clear. No thyromegaly noted.  CV: RRR with no murmurs appreciated Pulm: NWOB, CTAB with no crackles, wheezes, or rhonchi GI: Normal bowel sounds present. Soft, Nontender, Nondistended. MSK: no edema, cyanosis, or clubbing noted Skin: warm, dry Neuro: CN2-12 grossly intact. Strength 5/5 in upper and lower extremities. Reflexes symmetric and intact bilaterally.  Psych: Normal affect and thought content     I,Jordan Kelly,acting as a scribe for Dimas Chyle, MD.,have documented all relevant documentation on the behalf of Dimas Chyle, MD,as directed by  Dimas Chyle, MD while in the presence of Dimas Chyle, MD.  I, Dimas Chyle, MD, have reviewed all documentation for this visit. The documentation on 08/31/21 for the exam, diagnosis, procedures, and orders are all accurate and complete.  Algis Greenhouse. Jerline Pain, MD 08/31/2021  2:30 PM

## 2021-08-31 NOTE — Assessment & Plan Note (Signed)
Check A1c. 

## 2021-08-31 NOTE — Assessment & Plan Note (Signed)
Refilled albuterol.  Recently had a flareup but symptoms are now stable.

## 2021-08-31 NOTE — Assessment & Plan Note (Signed)
At goal.  Check labs.  Not on any medications.

## 2021-08-31 NOTE — Assessment & Plan Note (Signed)
On allopurinol 450 mg daily and colchicine as needed.  Check uric acid level today.

## 2021-09-01 NOTE — Progress Notes (Signed)
Please inform patient of the following:  B12 is low.  This could explain some of his symptoms.  Recommend starting B12 protocol here.  His uric acid level is a bit high. We can increase his dose of allopurinol to lower this and reduce chance of having gout attack if he wishes.  If he feels like things are overall stable we can leave the same dose.   His cholesterol and blood sugar both borderline.  Do not need to make any other medication adjustments at this point.  We should recheck again in about 6 months.

## 2021-09-09 ENCOUNTER — Other Ambulatory Visit: Payer: Self-pay

## 2021-09-09 ENCOUNTER — Ambulatory Visit (INDEPENDENT_AMBULATORY_CARE_PROVIDER_SITE_OTHER): Payer: Federal, State, Local not specified - PPO

## 2021-09-09 DIAGNOSIS — E538 Deficiency of other specified B group vitamins: Secondary | ICD-10-CM | POA: Diagnosis not present

## 2021-09-09 MED ORDER — CYANOCOBALAMIN 1000 MCG/ML IJ SOLN
1000.0000 ug | Freq: Once | INTRAMUSCULAR | Status: AC
  Administered 2021-09-09: 1000 ug via INTRAMUSCULAR

## 2021-09-09 NOTE — Progress Notes (Signed)
Per orders of Dr. Parker, injection of B-12 given by Kemuel Buchmann D Thoms Barthelemy in left deltoid. Patient tolerated injection well. Patient will make appointment for 1 week.   

## 2021-09-17 ENCOUNTER — Other Ambulatory Visit: Payer: Self-pay

## 2021-09-17 ENCOUNTER — Ambulatory Visit (INDEPENDENT_AMBULATORY_CARE_PROVIDER_SITE_OTHER): Payer: Federal, State, Local not specified - PPO

## 2021-09-17 DIAGNOSIS — E538 Deficiency of other specified B group vitamins: Secondary | ICD-10-CM

## 2021-09-17 MED ORDER — CYANOCOBALAMIN 1000 MCG/ML IJ SOLN
1000.0000 ug | Freq: Once | INTRAMUSCULAR | Status: AC
  Administered 2021-09-17: 1000 ug via INTRAMUSCULAR

## 2021-09-17 NOTE — Progress Notes (Signed)
Gerald Palmer 45 yr old male presents to office for his 2nd of 4 weekly B12 injections per Jacquiline Doe, MD. Administered CYANOCOBALAMIN 1,000 mcg IM left arm. Patient tolerated well.

## 2021-09-24 ENCOUNTER — Other Ambulatory Visit: Payer: Self-pay

## 2021-09-24 ENCOUNTER — Ambulatory Visit (INDEPENDENT_AMBULATORY_CARE_PROVIDER_SITE_OTHER): Payer: Federal, State, Local not specified - PPO | Admitting: *Deleted

## 2021-09-24 DIAGNOSIS — E538 Deficiency of other specified B group vitamins: Secondary | ICD-10-CM | POA: Diagnosis not present

## 2021-09-24 MED ORDER — CYANOCOBALAMIN 1000 MCG/ML IJ SOLN
1000.0000 ug | Freq: Once | INTRAMUSCULAR | Status: AC
  Administered 2021-09-24: 1000 ug via INTRAMUSCULAR

## 2021-09-24 NOTE — Progress Notes (Signed)
Patient presents for B12 injection today. Patient received her B12 injection in Left  Deltoid. Patient tolerated injection well.  Documentation entered in MAR in [employer].   

## 2021-10-01 ENCOUNTER — Ambulatory Visit (INDEPENDENT_AMBULATORY_CARE_PROVIDER_SITE_OTHER): Payer: Federal, State, Local not specified - PPO

## 2021-10-01 ENCOUNTER — Other Ambulatory Visit: Payer: Self-pay

## 2021-10-01 DIAGNOSIS — E538 Deficiency of other specified B group vitamins: Secondary | ICD-10-CM | POA: Diagnosis not present

## 2021-10-01 MED ORDER — CYANOCOBALAMIN 1000 MCG/ML IJ SOLN
1000.0000 ug | Freq: Once | INTRAMUSCULAR | Status: AC
  Administered 2021-10-01: 1000 ug via INTRAMUSCULAR

## 2021-10-01 NOTE — Progress Notes (Signed)
Gerald Palmer 45 year old male presents in office today for b12 injection. Tolerated injection well, is aware to return for next dose

## 2021-10-27 ENCOUNTER — Emergency Department (HOSPITAL_BASED_OUTPATIENT_CLINIC_OR_DEPARTMENT_OTHER)
Admission: EM | Admit: 2021-10-27 | Discharge: 2021-10-27 | Disposition: A | Discharge status: Home / Self Care | Payer: Federal, State, Local not specified - PPO | Attending: Emergency Medicine | Admitting: Emergency Medicine

## 2021-10-27 ENCOUNTER — Telehealth: Payer: Self-pay | Admitting: *Deleted

## 2021-10-27 ENCOUNTER — Other Ambulatory Visit: Payer: Self-pay

## 2021-10-27 ENCOUNTER — Encounter (HOSPITAL_BASED_OUTPATIENT_CLINIC_OR_DEPARTMENT_OTHER): Payer: Self-pay

## 2021-10-27 DIAGNOSIS — M79651 Pain in right thigh: Secondary | ICD-10-CM | POA: Insufficient documentation

## 2021-10-27 DIAGNOSIS — I10 Essential (primary) hypertension: Secondary | ICD-10-CM | POA: Insufficient documentation

## 2021-10-27 DIAGNOSIS — J45909 Unspecified asthma, uncomplicated: Secondary | ICD-10-CM | POA: Diagnosis not present

## 2021-10-27 DIAGNOSIS — Z87891 Personal history of nicotine dependence: Secondary | ICD-10-CM | POA: Insufficient documentation

## 2021-10-27 DIAGNOSIS — G5712 Meralgia paresthetica, left lower limb: Secondary | ICD-10-CM | POA: Diagnosis not present

## 2021-10-27 DIAGNOSIS — R209 Unspecified disturbances of skin sensation: Secondary | ICD-10-CM | POA: Insufficient documentation

## 2021-10-27 DIAGNOSIS — Z7951 Long term (current) use of inhaled steroids: Secondary | ICD-10-CM | POA: Diagnosis not present

## 2021-10-27 MED ORDER — DICLOFENAC SODIUM 1 % EX GEL: 2.0000 g | Freq: Four times a day (QID) | CUTANEOUS | 0 refills | Status: AC

## 2021-10-27 MED ORDER — GABAPENTIN 100 MG PO CAPS: 300.0000 mg | ORAL_CAPSULE | Freq: Three times a day (TID) | ORAL | 0 refills | Status: DC

## 2021-10-27 NOTE — Discharge Instructions (Addendum)
Your history and exam today are consistent with meralgia paresthetica similar but worse to what you had in the past.  I spoke with pharmacy who agreed with increasing your gabapentin by taking 2 tablets 3 times a day for the next several days and then increasing to 3 tablets 3 times a day.  Please also call to follow-up with your primary doctor.  Although your chart says you have an intolerance to NSAIDs, it appears you had success with the Voltaren gel so please fill this if you would like to try and using something topical.  Please rest and stay hydrated.  If any symptoms change or worsen, please return to the nearest emergency room.

## 2021-10-27 NOTE — Telephone Encounter (Signed)
FMLA Faxed to [phone]  Unable to LVM to patient  Voice mail is full

## 2021-10-27 NOTE — ED Provider Notes (Addendum)
[facility] EMERGENCY DEPT Provider Note   CSN: [id number] Arrival date & time: 10/27/21  0557     History Chief Complaint  Patient presents with   thigh pain    Gerald Palmer is a 45 y.o. male.  The history is provided by the patient and medical records. No language interpreter was used.  Leg Pain Location:  Leg Time since incident:  2 weeks Injury: no   Leg location:  L upper leg Pain details:    Quality:  Aching, cramping and tingling   Radiates to:  Does not radiate   Severity:  Moderate   Onset quality:  Gradual   Duration:  2 weeks   Timing:  Constant   Progression:  Waxing and waning Chronicity:  Recurrent Dislocation: no   Tetanus status:  Unknown Prior injury to area:  No Relieved by:  Nothing Worsened by:  Nothing (palpation) Ineffective treatments:  None tried Associated symptoms: tingling   Associated symptoms: no back pain, no decreased ROM, no fatigue, no fever, no itching, no muscle weakness, no numbness, no stiffness and no swelling       Past Medical History:  Diagnosis Date   Asthma    Hypertension     Patient Active Problem List   Diagnosis Date Noted   Essential hypertension 08/04/2021   Ulnar neuropathy of both upper extremities 08/04/2021   Meralgia paresthetica 10/05/2019   Carpal tunnel syndrome, bilateral 08/14/2019   Severe obstructive sleep apnea-hypopnea syndrome 05/15/2019   Dyslipidemia 08/08/2018   Hyperglycemia 08/08/2018   Depression, major, single episode, mild ([facility]) 08/07/2018   Gout 03/21/2018   Knee pain 12/30/2017   Allergic rhinitis 11/14/2017   Asthma 10/11/2017    Past Surgical History:  Procedure Laterality Date   ACHILLES TENDON SURGERY     ANKLE FRACTURE SURGERY Right    TONSILLECTOMY         Family History  Problem Relation Age of Onset   Hypertension Mother    Arthritis Mother    Diabetes Mother    Hyperlipidemia Mother    Asthma Father    Hyperlipidemia Father    Hypertension  Father    Stroke Father    Hypertension Brother    Hyperlipidemia Brother    Arthritis Maternal Grandmother     Social History   Tobacco Use   Smoking status: Former   Smokeless tobacco: Never  Building services engineer Use: Never used  Substance Use Topics   Alcohol use: Yes    Comment: occasionally   Drug use: No    Home Medications Prior to Admission medications   Medication Sig Start Date End Date Taking? Authorizing Provider  albuterol (PROVENTIL) (2.5 MG/3ML) 0.083% nebulizer solution Take 3 mLs (2.5 mg total) by nebulization every 6 (six) hours as needed for wheezing or shortness of breath. 12/11/20   Ardith Dark, MD  albuterol (VENTOLIN HFA) 108 (90 Base) MCG/ACT inhaler Inhale 2 puffs into the lungs every 4 (four) hours as needed for wheezing or shortness of breath. 08/31/21   Ardith Dark, MD  allopurinol (ZYLOPRIM) 300 MG tablet Take 1.5 tablets (450 mg total) by mouth daily. 08/31/21   Ardith Dark, MD  atorvastatin (LIPITOR) 40 MG tablet TAKE 1 TABLET(40 MG) BY MOUTH DAILY 08/31/21   Ardith Dark, MD  azelastine (ASTELIN) 0.1 % nasal spray Place 2 sprays into both nostrils 2 (two) times daily. 02/06/20   Ardith Dark, MD  budesonide-formoterol [facility]) 160-4.5 MCG/ACT inhaler INHALE 2 PUFFS  BY MOUTH TWICE DAILY 04/29/21   Ardith Dark, MD  cetirizine (ZYRTEC) 10 MG tablet Take 1 tablet (10 mg total) by mouth daily. 03/15/19   Ardith Dark, MD  colchicine 0.6 MG tablet TAKE 1 TABLET(0.6 MG) BY MOUTH DAILY AS NEEDED FOR GOUT 08/31/21   Ardith Dark, MD  gabapentin (NEURONTIN) 100 MG capsule Take 1 capsule (100 mg total) by mouth 3 (three) times daily. 08/31/21   Ardith Dark, MD  montelukast (SINGULAIR) 10 MG tablet Take one tablet once daily 08/31/21   Ardith Dark, MD    Allergies    Aspirin, Ibuprofen, Nsaids, and Shellfish allergy  Review of Systems   Review of Systems  Constitutional:  Negative for chills, fatigue and fever.  HENT:  Negative  for congestion.   Respiratory:  Negative for cough, chest tightness, shortness of breath and wheezing.   Gastrointestinal:  Negative for abdominal pain, constipation, diarrhea, nausea and vomiting.  Genitourinary:  Negative for dysuria, flank pain and frequency.  Musculoskeletal:  Negative for back pain and stiffness.  Skin:  Negative for itching, rash and wound.  Neurological:  Negative for dizziness, seizures, syncope, weakness, light-headedness, numbness and headaches.  Psychiatric/Behavioral:  Negative for agitation and confusion.   All other systems reviewed and are negative.  Physical Exam Updated Vital Signs BP (!) 147/96 (BP Location: Right Arm)   Pulse 62   Temp 98.1 F (36.7 C)   Resp 16   Ht 5\' 7"  (1.702 m)   Wt 112 kg   SpO2 98%   BMI 38.69 kg/m   Physical Exam Vitals and nursing note reviewed.  Constitutional:      General: Gerald Palmer is not in acute distress.    Appearance: Gerald Palmer is well-developed. Gerald Palmer is not ill-appearing, toxic-appearing or diaphoretic.  HENT:     Head: Normocephalic and atraumatic.     Nose: No congestion or rhinorrhea.     Mouth/Throat:     Mouth: Mucous membranes are moist.     Pharynx: No oropharyngeal exudate or posterior oropharyngeal erythema.  Eyes:     Extraocular Movements: Extraocular movements intact.     Conjunctiva/sclera: Conjunctivae normal.     Pupils: Pupils are equal, round, and reactive to light.  Cardiovascular:     Rate and Rhythm: Normal rate and regular rhythm.     Heart sounds: No murmur heard. Pulmonary:     Effort: Pulmonary effort is normal. No respiratory distress.     Breath sounds: Normal breath sounds. No wheezing, rhonchi or rales.  Chest:     Chest wall: No tenderness.  Abdominal:     General: Abdomen is flat.     Palpations: Abdomen is soft.     Tenderness: There is no abdominal tenderness. There is no guarding or rebound.  Musculoskeletal:        General: Tenderness present.     Cervical back: Neck supple.      Left upper leg: Tenderness present. No swelling or bony tenderness.     Right lower leg: No edema.     Left lower leg: No edema.       Legs:  Skin:    General: Skin is warm and dry.     Capillary Refill: Capillary refill takes less than 2 seconds.     Findings: No erythema.  Neurological:     General: No focal deficit present.     Mental Status: Gerald Palmer is alert.     Sensory: No sensory deficit.  Motor: No weakness.  Psychiatric:        Mood and Affect: Mood normal.    ED Results / Procedures / Treatments   Labs (all labs ordered are listed, but only abnormal results are displayed) Labs Reviewed - No data to display  EKG None  Radiology No results found.  Procedures Procedures   Medications Ordered in ED Medications - No data to display  ED Course  I have reviewed the triage vital signs and the nursing notes.  Pertinent labs & imaging results that were available during my care of the patient were reviewed by me and considered in my medical decision making (see chart for details).    MDM Rules/Calculators/A&P                           Ogle Hoeffner is a 45 y.o. male with a past medical history significant for gout, dyslipidemia, hypertension, and severe meralgia paresthetica who presents with left thigh pain.  Patient reports that this is similar but worse to the meralgia paresthetica Gerald Palmer had in the past.  Gerald Palmer reports that Gerald Palmer has been on a low-dose gabapentin ever since that has kept it in check but over the last 2 weeks it has flared up again.  Gerald Palmer denies any trauma, skin changes, or any rashes.  Gerald Palmer reports this feels similar but just more intense.  Describes as a 6 out of 10 in pain and anything that touches his leg makes it hurt worse.  It is a aching, tingling, and burning discomfort but does not go past the knee and does not go distally.  Denies any leg pain or foot pain otherwise.  Denies any hip pain, chest pain, or back pain.  Denies any groin pains.  Denies any  fevers, chills congestion, cough.  Denies any urinary or bowel changes.  Feels similar to prior.  Gerald Palmer denies any weakness.  On exam, patient does have tenderness in his left lateral thigh.  No knee tenderness or popliteal tenderness.  No medial thigh tenderness or bony tenderness.  Intact sensation, strength, and pulses.  Intact gait with standing.  Back nontender.  Patient otherwise well-appearing.  Had a shared decision made conversation with patient.  Patient says that this does feel similar to prior.  Given his lack of trauma, I do suspect this is meralgia paresthetica and we agreed together to hold on any ultrasound or imaging today.  Low suspicion for bony injury or a DVT as a cause of symptoms.  Low suspicion for a back or pelvic cause of symptoms either with the distribution of his discomfort being similar to prior.  Chart review did show that patient has been on the gabapentin but is also had success with Voltaren gel.  I spoke to pharmacy and reviewed the PCP note saying that patient if needed could be increased to 300 mg of gabapentin 3 times a day up from the 103 times a day Gerald Palmer is on now.  Spoke with pharmacy who is reasonable to have several days of 203 times a day before going to 303 times a day and agreed with the Voltaren gel addition.  Patient is amenable to this plan and will also call to follow-up with his PCP.  We discussed that given his success with Voltaren gel in the past we will prescribe it to him and Gerald Palmer can fill it if Gerald Palmer would like to try.  Gerald Palmer understood this and agreed that Gerald Palmer has used  it without difficulty in the past despite the allergy in the chart.  Gerald Palmer understands return precautions for any new or worsened symptoms.  Gerald Palmer has questions or concerns and was discharged in good condition    Final Clinical Impression(s) / ED Diagnoses Final diagnoses:  Meralgia paraesthetica, left    Rx / DC Orders ED Discharge Orders          Ordered    gabapentin (NEURONTIN) 100 MG  capsule  3 times daily        10/27/21 0827    diclofenac Sodium (VOLTAREN) 1 % GEL  4 times daily        10/27/21 0827            Clinical Impression: 1. Meralgia paraesthetica, left     Disposition: Discharge  Condition: Good  I have discussed the results, Dx and Tx plan with the pt(& family if present). Gerald Palmer/she/they expressed understanding and agree(s) with the plan. Discharge instructions discussed at great length. Strict return precautions discussed and pt &/or family have verbalized understanding of the instructions. No further questions at time of discharge.    New Prescriptions   DICLOFENAC SODIUM (VOLTAREN) 1 % GEL    Apply 2 g topically 4 (four) times daily.   GABAPENTIN (NEURONTIN) 100 MG CAPSULE    Take 3 capsules (300 mg total) by mouth 3 (three) times daily for 14 days.    Follow Up: Ardith Dark, MD [address] [place] [address] [phone]     [facility] [address] [place] [address] [phone]       Annaliese Saez, Canary Brim, MD 10/27/21 0830    Kenyette Gundy, Canary Brim, MD 10/27/21 [phone]

## 2021-10-27 NOTE — ED Triage Notes (Signed)
Patient states he is having left thigh pain. Thigh is painful to touch.

## 2021-11-05 ENCOUNTER — Other Ambulatory Visit: Payer: Self-pay

## 2021-11-05 ENCOUNTER — Ambulatory Visit (INDEPENDENT_AMBULATORY_CARE_PROVIDER_SITE_OTHER): Payer: Federal, State, Local not specified - PPO

## 2021-11-05 DIAGNOSIS — E538 Deficiency of other specified B group vitamins: Secondary | ICD-10-CM | POA: Diagnosis not present

## 2021-11-05 MED ORDER — CYANOCOBALAMIN 1000 MCG/ML IJ SOLN
1000.0000 ug | Freq: Once | INTRAMUSCULAR | Status: AC
  Administered 2021-11-05: 16:00:00 1000 ug via INTRAMUSCULAR

## 2021-11-05 NOTE — Progress Notes (Signed)
After obtaining consent, and per orders of Dr. Parker, injection of Vitamin B 12  given by Jesyca Weisenburger.  

## 2021-11-10 DIAGNOSIS — G4733 Obstructive sleep apnea (adult) (pediatric): Secondary | ICD-10-CM | POA: Diagnosis not present

## 2021-12-03 ENCOUNTER — Other Ambulatory Visit: Payer: Self-pay | Admitting: Family Medicine

## 2021-12-03 ENCOUNTER — Other Ambulatory Visit: Payer: Self-pay

## 2021-12-03 ENCOUNTER — Ambulatory Visit (INDEPENDENT_AMBULATORY_CARE_PROVIDER_SITE_OTHER): Payer: Federal, State, Local not specified - PPO

## 2021-12-03 DIAGNOSIS — E538 Deficiency of other specified B group vitamins: Secondary | ICD-10-CM

## 2021-12-03 MED ORDER — CYANOCOBALAMIN 1000 MCG/ML IJ SOLN
1000.0000 ug | Freq: Once | INTRAMUSCULAR | Status: AC
  Administered 2021-12-03: 1000 ug via INTRAMUSCULAR

## 2021-12-03 NOTE — Telephone Encounter (Signed)
Last OV 08/31/20 Filled 12/11/20 Nurse visit 12/03/21   Please advise

## 2021-12-03 NOTE — Progress Notes (Signed)
Per orders of Dr. Parker, injection of B-12 given by Sonnet Rizor D Kavin Weckwerth in left deltoid. Patient tolerated injection well. Patient will make appointment for 1 month.  

## 2021-12-08 ENCOUNTER — Other Ambulatory Visit: Payer: Self-pay

## 2021-12-08 ENCOUNTER — Encounter: Payer: Self-pay | Admitting: Family Medicine

## 2021-12-08 ENCOUNTER — Telehealth (INDEPENDENT_AMBULATORY_CARE_PROVIDER_SITE_OTHER): Payer: Federal, State, Local not specified - PPO | Admitting: Family Medicine

## 2021-12-08 DIAGNOSIS — R197 Diarrhea, unspecified: Secondary | ICD-10-CM | POA: Diagnosis not present

## 2021-12-08 MED ORDER — AMOXICILLIN-POT CLAVULANATE 875-125 MG PO TABS: 1.0000 | ORAL_TABLET | Freq: Two times a day (BID) | ORAL | 0 refills | Status: DC

## 2021-12-08 MED ORDER — ONDANSETRON HCL 8 MG PO TABS
8.0000 mg | ORAL_TABLET | Freq: Three times a day (TID) | ORAL | 0 refills | Status: DC | PRN
Start: 2021-12-08 — End: 2023-04-19

## 2021-12-08 NOTE — Progress Notes (Signed)
° °  Gerald Palmer is a 45 y.o. male who presents today for a telephone visit.  Assessment/Plan:  Diarrhea Likely viral gastroenteritis.  No red flag signs or symptoms.  Discussed conservative management.  He can continue Pepto and Imodium.  We will send in Zofran.  We will also send in pocket prescription for Augmentin with instruction to not start unless symptoms fail to improve in the next few days or if he develops any worsening abdominal pain, fever or chills.  He agreed to this plan.  Discussed reasons to return to care.  Follow-up as needed.     Subjective:  HPI:  Patient with abdominal pain and diarrhea for the last day. No known sick contacts. No questionable food intake. Ended up having 4 episodes of diarrhea. Was loose then became watery. This morning feels like it "mixed" loose and water. Some nausea and vomiting. No blood in stool. Pain is mostly located in lower abdomen. No fevers or chills. PEpto and immodium did not help.        Objective/Observations   NAD  Telephone Visit   I connected with Gerald Palmer on 12/08/21 at  4:00 PM EST via telephone and verified that I am speaking with the correct person using two identifiers. I discussed the limitations of evaluation and management by telemedicine and the availability of in person appointments. The patient expressed understanding and agreed to proceed.   Patient location: Home Provider location: [place] [employer] Persons participating in the virtual visit: Myself and Patient       Katina Degree. [name], MD 12/08/2021 2:07 PM

## 2021-12-10 DIAGNOSIS — G4733 Obstructive sleep apnea (adult) (pediatric): Secondary | ICD-10-CM | POA: Diagnosis not present

## 2021-12-20 ENCOUNTER — Emergency Department (HOSPITAL_BASED_OUTPATIENT_CLINIC_OR_DEPARTMENT_OTHER): Payer: Federal, State, Local not specified - PPO | Admitting: Radiology

## 2021-12-20 ENCOUNTER — Emergency Department (HOSPITAL_BASED_OUTPATIENT_CLINIC_OR_DEPARTMENT_OTHER)
Admission: EM | Admit: 2021-12-20 | Discharge: 2021-12-20 | Disposition: A | Discharge status: Home / Self Care | Payer: Federal, State, Local not specified - PPO | Attending: Emergency Medicine | Admitting: Emergency Medicine

## 2021-12-20 ENCOUNTER — Other Ambulatory Visit: Payer: Self-pay

## 2021-12-20 DIAGNOSIS — S8011XA Contusion of right lower leg, initial encounter: Secondary | ICD-10-CM | POA: Insufficient documentation

## 2021-12-20 DIAGNOSIS — W01198A Fall on same level from slipping, tripping and stumbling with subsequent striking against other object, initial encounter: Secondary | ICD-10-CM | POA: Insufficient documentation

## 2021-12-20 DIAGNOSIS — S8991XA Unspecified injury of right lower leg, initial encounter: Secondary | ICD-10-CM | POA: Diagnosis not present

## 2021-12-20 DIAGNOSIS — S82201A Unspecified fracture of shaft of right tibia, initial encounter for closed fracture: Secondary | ICD-10-CM | POA: Diagnosis not present

## 2021-12-20 DIAGNOSIS — Y9289 Other specified places as the place of occurrence of the external cause: Secondary | ICD-10-CM | POA: Insufficient documentation

## 2021-12-20 DIAGNOSIS — Y990 Civilian activity done for income or pay: Secondary | ICD-10-CM | POA: Diagnosis not present

## 2021-12-20 MED ORDER — IBUPROFEN 400 MG PO TABS
600.0000 mg | ORAL_TABLET | Freq: Once | ORAL | Status: AC
  Administered 2021-12-20: 600 mg via ORAL
  Filled 2021-12-20: qty 1

## 2021-12-20 NOTE — ED Provider Notes (Signed)
[facility] DEPT Provider Note   CSN: [id number] Arrival date & time: 12/20/21  [id number]     History  Chief Complaint  Patient presents with   Leg Injury    Gerald Palmer is a 46 y.o. male.  Patient here with right leg pain and swelling after injury yesterday while working at the [employer].  States he was assembling a metal shelf which fell out of a cabinet and hit him on the right shin approximately 24 hours ago.  He has had increased pain and swelling since.  He has been using over-the-counter anti-inflammatories at home as well as Voltaren gel without relief.  Comes in today with worsening pain and swelling but able to bear weight.  No numbness or tingling.  No weakness.  No other injury.  No neck or back pain.  No blood thinner use.  No history of blood clots.  No breaks in the skin.  The history is provided by the patient.      Home Medications Prior to Admission medications   Medication Sig Start Date End Date Taking? Authorizing Provider  albuterol (PROVENTIL) (2.5 MG/3ML) 0.083% nebulizer solution Take 3 mLs (2.5 mg total) by nebulization every 6 (six) hours as needed for wheezing or shortness of breath. 12/11/20   Vivi Barrack, MD  albuterol (VENTOLIN HFA) 108 (90 Base) MCG/ACT inhaler INHALE 2 PUFFS INTO THE LUNGS EVERY 4 TO 6 HOURS AS NEEDED FOR COUGH OR WHEEZING 12/03/21   Vivi Barrack, MD  allopurinol (ZYLOPRIM) 300 MG tablet Take 1.5 tablets (450 mg total) by mouth daily. 08/31/21   Vivi Barrack, MD  amoxicillin-clavulanate (AUGMENTIN) 875-125 MG tablet Take 1 tablet by mouth 2 (two) times daily. 12/08/21   Vivi Barrack, MD  atorvastatin (LIPITOR) 40 MG tablet TAKE 1 TABLET(40 MG) BY MOUTH DAILY 08/31/21   Vivi Barrack, MD  azelastine (ASTELIN) 0.1 % nasal spray Place 2 sprays into both nostrils 2 (two) times daily. 02/06/20   Vivi Barrack, MD  budesonide-formoterol [facility]) 160-4.5 MCG/ACT inhaler INHALE 2 PUFFS BY MOUTH TWICE  DAILY 04/29/21   Vivi Barrack, MD  cetirizine (ZYRTEC) 10 MG tablet Take 1 tablet (10 mg total) by mouth daily. 03/15/19   Vivi Barrack, MD  colchicine 0.6 MG tablet TAKE 1 TABLET(0.6 MG) BY MOUTH DAILY AS NEEDED FOR GOUT 08/31/21   Vivi Barrack, MD  diclofenac Sodium (VOLTAREN) 1 % GEL Apply 2 g topically 4 (four) times daily. 10/27/21   Tegeler, Gwenyth Allegra, MD  gabapentin (NEURONTIN) 100 MG capsule Take 1 capsule (100 mg total) by mouth 3 (three) times daily. 08/31/21   Vivi Barrack, MD  gabapentin (NEURONTIN) 100 MG capsule Take 3 capsules (300 mg total) by mouth 3 (three) times daily for 14 days. 10/27/21 11/10/21  Tegeler, Gwenyth Allegra, MD  montelukast (SINGULAIR) 10 MG tablet Take one tablet once daily 08/31/21   Vivi Barrack, MD  ondansetron (ZOFRAN) 8 MG tablet Take 1 tablet (8 mg total) by mouth every 8 (eight) hours as needed for nausea or vomiting. 12/08/21   Vivi Barrack, MD      Allergies    Aspirin, Ibuprofen, Nsaids, and Shellfish allergy    Review of Systems   Review of Systems  Constitutional:  Negative for activity change, appetite change and fever.  Respiratory:  Negative for chest tightness and shortness of breath.   Cardiovascular:  Negative for chest pain.  Gastrointestinal:  Negative for abdominal pain, nausea  and vomiting.  Genitourinary:  Negative for dysuria and hematuria.  Musculoskeletal:  Positive for arthralgias and myalgias.  Skin:  Negative for rash.  Neurological:  Negative for dizziness, weakness and headaches.   all other systems are negative except as noted in the HPI and PMH.   Physical Exam Updated Vital Signs BP (!) 161/104 (BP Location: Right Arm)    Pulse 79    Temp 98.6 F (37 C) (Oral)    Resp 18    Wt 115.7 kg    SpO2 98%    BMI 39.94 kg/m  Physical Exam Vitals and nursing note reviewed.  Constitutional:      General: He is not in acute distress.    Appearance: He is well-developed.  HENT:     Head: Normocephalic and  atraumatic.     Mouth/Throat:     Pharynx: No oropharyngeal exudate.  Eyes:     Conjunctiva/sclera: Conjunctivae normal.     Pupils: Pupils are equal, round, and reactive to light.  Neck:     Comments: No meningismus. Cardiovascular:     Rate and Rhythm: Normal rate and regular rhythm.     Heart sounds: Normal heart sounds. No murmur heard. Pulmonary:     Effort: Pulmonary effort is normal. No respiratory distress.     Breath sounds: Normal breath sounds.  Abdominal:     Palpations: Abdomen is soft.     Tenderness: There is no abdominal tenderness. There is no guarding or rebound.  Musculoskeletal:        General: Swelling, tenderness and signs of injury present. Normal range of motion.     Cervical back: Normal range of motion and neck supple.     Comments: Tenderness and swelling to R anterior shin. No breaks in the skin.  Intact DP and PT pulses. Compartments soft.  FROM R knee and ankle with full ROM.  Skin:    General: Skin is warm.     Findings: No rash.  Neurological:     General: No focal deficit present.     Mental Status: He is alert and oriented to person, place, and time. Mental status is at baseline.     Cranial Nerves: No cranial nerve deficit.     Motor: No abnormal muscle tone.     Coordination: Coordination normal.     Comments:  5/5 strength throughout. CN 2-12 intact.Equal grip strength.   Psychiatric:        Behavior: Behavior normal.    ED Results / Procedures / Treatments   Labs (all labs ordered are listed, but only abnormal results are displayed) Labs Reviewed - No data to display  EKG None  Radiology No results found.  Procedures Procedures    Medications Ordered in ED Medications  ibuprofen (ADVIL) tablet 600 mg (has no administration in time range)    ED Course/ Medical Decision Making/ A&P                           Medical Decision Making  Blunt traumatic injury to her right shin x24 hours ago.  Neurovascular intact.  Some  swelling and tenderness.  No breaks in the skin  Low suspicion for fracture but will obtain Xray.  Ice, elevation, NSAIDs  Low suspicion for DVT given acute traumatic injury  Xray reviewed and independently interpreted by me. No acute fracture. Remote fibula fracture.   Ice, NSAIDs, elevation, PCP followup.  Return precautions discussed.  Final Clinical Impression(s) / ED Diagnoses Final diagnoses:  Contusion of right leg, initial encounter    Rx / DC Orders ED Discharge Orders     None         Marieelena Bartko, Annie Main, MD 12/20/21 [phone]

## 2021-12-20 NOTE — Discharge Instructions (Addendum)
Your Xray is negative.  Keep leg elevated and apply ice.  Use anti-inflammatories as needed for pain and swelling.  Follow-up with your primary doctor for recheck this week.  Return to the ED with worsening pain, swelling, numbness, tingling, weakness, or any other concerns.

## 2021-12-20 NOTE — ED Triage Notes (Signed)
Presents for R shin swelling and pain associated with injury at work yesterday, metal object fell onto R leg. Able to bear weight, denies sensation or color change in R leg or foot

## 2021-12-24 NOTE — Progress Notes (Signed)
° °  I, Christoper Fabian, LAT, ATC, am serving as scribe for Dr. Clementeen Graham.  Gerald Palmer is a 46 y.o. male who presents to [facility] today for R ant lower leg pain and swelling since 12/20/21.  He was last seen by Dr. Denyse Amass on 03/16/21 for R ankle pain due to gout.  Today, pt reports R ant lower leg pain and swelling since 12/19/21 when a metal shelf fell at work (pt works at [employer]), striking him on his R shin.  He was seen at the [facility] ED on 12/20/21 and was advised to use NSAIDs, ice and elevation.  Since then, pt reports soreness and swelling remains. Pt notes swelling has improved. Pt locates pain to the anterior-lateral aspect of the lower leg. Pt has had to cont to work while trying to recover from this injury.  R lower leg swelling: yes R LE numbness/tingling: yes- "funny feeling" in his R foot and ankle Aggravating factors: nothing in particular Treatments tried: RICE  Diagnostic testing: R tib/fib XR- 12/20/21  Pertinent review of systems: No fevers or chills  Relevant historical information: History of prior fibula fracture right leg over 10 years ago.   Exam:  BP 138/86    Pulse 75    Ht 5\' 7"  (1.702 m)    Wt 257 lb (116.6 kg)    SpO2 96%    BMI 40.25 kg/m  General: Well Developed, well nourished, and in no acute distress.   MSK: Right leg swelling anterior lateral leg.  Tender palpation anterior swelling. Lower extremity strength pulses capillary fill and sensation are intact distally.    Lab and Radiology Results  Diagnostic Limited MSK Ultrasound of: Right lateral shin Area of swelling visualized.  Hypoechoic fluid mixed with a irregular line of hyperechoic change consistent with the calcification seen on x-ray listed below. Bony structures otherwise normal-appearing Distally edema present. Impression: Hematoma with calcification.  No fracture.   EXAM: RIGHT TIBIA AND FIBULA - 2 VIEW   COMPARISON:  None.   FINDINGS: No  acute fracture or subluxation. Road fibular shaft fracture with adjacent heterotopic ossification.   IMPRESSION: No acute finding.     Electronically Signed   By: M.D.   On: 12/20/2021 07:39   I, 02/17/2022, personally (independently) visualized and performed the interpretation of the images attached in this note.   Assessment and Plan: 46 y.o. male with right leg hematoma.  This by coincidence happened to occur in area of prior injury near a fibular fracture with an area of calcification.  This is seen on ultrasound as well.  Plan for compression and exercises working on stretching and strengthening the tibialis anterior muscle group. Recheck in 2 to 4 weeks.  Work note provided.   PDMP not reviewed this encounter. Orders Placed This Encounter  Procedures   55 LIMITED JOINT SPACE STRUCTURES LOW RIGHT(NO LINKED CHARGES)    Order Specific Question:   Reason for Exam (SYMPTOM  OR DIAGNOSIS REQUIRED)    Answer:   rt lower leg bruise    Order Specific Question:   Preferred imaging location?    Answer:   [facility]   No orders of the defined types were placed in this encounter.    Discussed warning signs or symptoms. Please see discharge instructions. Patient expresses understanding.   The above documentation has been reviewed and is accurate and complete US, M.D.

## 2021-12-25 ENCOUNTER — Ambulatory Visit: Payer: Self-pay

## 2021-12-25 ENCOUNTER — Ambulatory Visit: Payer: Federal, State, Local not specified - PPO | Admitting: Family Medicine

## 2021-12-25 ENCOUNTER — Other Ambulatory Visit: Payer: Self-pay

## 2021-12-25 VITALS — BP 138/86 | HR 75 | Ht 67.0 in | Wt 257.0 lb

## 2021-12-25 DIAGNOSIS — M79604 Pain in right leg: Secondary | ICD-10-CM | POA: Diagnosis not present

## 2021-12-25 NOTE — Patient Instructions (Addendum)
Thank you for coming in today.   I recommend you obtained a compression sleeve to help with your joint problems. There are many options on the market however I recommend obtaining a lower leg Body Helix compression sleeve.  You can find information (including how to appropriate measure yourself for sizing) can be found at www.Body [email or web address].ch.  Many of these products are health savings account (HSA) eligible.  You can use the compression sleeve at any time throughout the day but is most important to use while being active as well as for 2 hours post-activity.   It is appropriate to ice following activity with the compression sleeve in place.   Please complete the exercises that the athletic trainer went over with you: View at www.my-exercise-code.com using code: [id number]  Recheck back in 2-4 weeks

## 2021-12-28 ENCOUNTER — Ambulatory Visit: Payer: Federal, State, Local not specified - PPO | Admitting: Family Medicine

## 2022-01-06 ENCOUNTER — Other Ambulatory Visit: Payer: Self-pay

## 2022-01-06 ENCOUNTER — Ambulatory Visit (INDEPENDENT_AMBULATORY_CARE_PROVIDER_SITE_OTHER): Payer: Federal, State, Local not specified - PPO | Admitting: Family Medicine

## 2022-01-06 DIAGNOSIS — E538 Deficiency of other specified B group vitamins: Secondary | ICD-10-CM

## 2022-01-06 MED ORDER — CYANOCOBALAMIN 1000 MCG/ML IJ SOLN
1000.0000 ug | Freq: Once | INTRAMUSCULAR | Status: AC
  Administered 2022-01-06: 1000 ug via INTRAMUSCULAR

## 2022-01-06 NOTE — Progress Notes (Signed)
B 12 given in left deltoid per patient preference. Patient tolerated well.

## 2022-01-10 DIAGNOSIS — G4733 Obstructive sleep apnea (adult) (pediatric): Secondary | ICD-10-CM | POA: Diagnosis not present

## 2022-01-14 NOTE — Progress Notes (Deleted)
° °  I, Gerald Palmer, LAT, ATC, am serving as scribe for Dr. Lynne Leader.  Gerald Palmer is a 46 y.o. male who presents to [facility] today for f/u of R ant lower leg pain and swelling that occurred on 12/19/21 when a metal shelf fell at work (pt works at [employer]), striking him on his R shin.  He was last seen by Dr. Georgina Snell on 12/25/21 and was shown a HEP focusing on R lower leg stretching and strengthening.  He was also advised to purchase a calf/lower leg compression sleeve.  Today, pt reports  Diagnostic testing: R tib/fib XR- 12/20/21  Pertinent review of systems: ***  Relevant historical information: ***   Exam:  There were no vitals taken for this visit. General: Well Developed, well nourished, and in no acute distress.   MSK: ***    Lab and Radiology Results No results found for this or any previous visit (from the past 72 hour(s)). No results found.     Assessment and Plan: 46 y.o. male with ***   PDMP not reviewed this encounter. No orders of the defined types were placed in this encounter.  No orders of the defined types were placed in this encounter.    Discussed warning signs or symptoms. Please see discharge instructions. Patient expresses understanding.   ***

## 2022-01-15 ENCOUNTER — Ambulatory Visit: Payer: Federal, State, Local not specified - PPO | Admitting: Family Medicine

## 2022-01-20 ENCOUNTER — Other Ambulatory Visit: Payer: Self-pay | Admitting: Family Medicine

## 2022-01-20 DIAGNOSIS — J45901 Unspecified asthma with (acute) exacerbation: Secondary | ICD-10-CM

## 2022-02-04 ENCOUNTER — Telehealth (INDEPENDENT_AMBULATORY_CARE_PROVIDER_SITE_OTHER): Payer: Federal, State, Local not specified - PPO | Admitting: Family Medicine

## 2022-02-04 DIAGNOSIS — J4540 Moderate persistent asthma, uncomplicated: Secondary | ICD-10-CM

## 2022-02-04 DIAGNOSIS — J019 Acute sinusitis, unspecified: Secondary | ICD-10-CM | POA: Diagnosis not present

## 2022-02-04 MED ORDER — CEFUROXIME AXETIL 500 MG PO TABS: 500.0000 mg | ORAL_TABLET | Freq: Two times a day (BID) | ORAL | 0 refills | Status: DC

## 2022-02-04 MED ORDER — ALBUTEROL SULFATE (2.5 MG/3ML) 0.083% IN NEBU: 2.5000 mg | INHALATION_SOLUTION | Freq: Four times a day (QID) | RESPIRATORY_TRACT | 1 refills | Status: DC | PRN

## 2022-02-04 NOTE — Progress Notes (Signed)
° °  Gerald Palmer is a 46 y.o. male who presents today for a virtual office visit.  Assessment/Plan:  New/Acute Problems: Sinusitis No red flags.  We starting prednisone for his asthma flare as below.  This should help some with the sinus infection.  We will also send in pocket prescription for Ceftin.  Encouraged hydration.  He can continue over-the-counter meds.  Discussed reasons return to care.  Follow-up as needed.  Chronic Problems Addressed Today: Asthma Mild flare recently due to his URI as above.  No signs of respiratory distress.  He will continue his Symbicort and albuterol.  We will start prednisone burst.  He has done well with this in the past.  He will let me know if not improving.     Subjective:  HPI:  Patient here with concerns for sinus infection.  Started about 5 days ago. Symptoms include mucus production, headache, sinus pressure. Covid test was negative. Symptoms seem to be worsening. Some wheezing and cough. Tried over the counter meds without significant improvement.        Objective/Observations  Physical Exam: Gen: NAD, resting comfortably Pulm: Normal work of breathing Neuro: Grossly normal, moves all extremities Psych: Normal affect and thought content  Virtual Visit via Video   I connected with Bonnee Quin on 02/04/22 at  4:00 PM EST by a video enabled telemedicine application and verified that I am speaking with the correct person using two identifiers. The limitations of evaluation and management by telemedicine and the availability of in person appointments were discussed. The patient expressed understanding and agreed to proceed.   Patient location: Home Provider location: [place] participating in the virtual visit: Myself and Patient     Algis Greenhouse. Jerline Pain, MD 02/04/2022 4:20 PM

## 2022-02-04 NOTE — Assessment & Plan Note (Signed)
Mild flare recently due to his URI as above.  No signs of respiratory distress.  He will continue his Symbicort and albuterol.  We will start prednisone burst.  He has done well with this in the past.  He will let me know if not improving.

## 2022-02-05 ENCOUNTER — Telehealth: Payer: Self-pay

## 2022-02-05 ENCOUNTER — Other Ambulatory Visit: Payer: Self-pay | Admitting: *Deleted

## 2022-02-05 ENCOUNTER — Encounter: Payer: Self-pay | Admitting: Family Medicine

## 2022-02-05 MED ORDER — PREDNISONE 50 MG PO TABS: 50.0000 mg | ORAL_TABLET | Freq: Every day | ORAL | 0 refills | Status: DC

## 2022-02-05 NOTE — Telephone Encounter (Signed)
Patient  was in our clinic on 02/04/2022 Letter done

## 2022-02-05 NOTE — Telephone Encounter (Signed)
Patient states he was told by Dr. [name] that he would send in prednisone to [facility] at [address]   States the pharmacy did not receive this script.  Is requesting call in regard.

## 2022-02-05 NOTE — Telephone Encounter (Signed)
Not sure what happened. Please send prednisone 50mg  daily x 5 days.  . Katina Degree, MD 02/05/2022 3:30 PM

## 2022-02-05 NOTE — Telephone Encounter (Signed)
Patient states he needs work note to include him being excused from work 2/15, 2/16 and 2/17.    Please send through mychart.

## 2022-02-05 NOTE — Telephone Encounter (Signed)
Please advise 

## 2022-02-05 NOTE — Telephone Encounter (Signed)
Rx send to pharmacy  

## 2022-03-25 DIAGNOSIS — G4733 Obstructive sleep apnea (adult) (pediatric): Secondary | ICD-10-CM | POA: Diagnosis not present

## 2022-04-15 ENCOUNTER — Encounter: Payer: Self-pay | Admitting: Family Medicine

## 2022-04-15 ENCOUNTER — Ambulatory Visit: Payer: Federal, State, Local not specified - PPO | Admitting: Family Medicine

## 2022-04-15 VITALS — BP 130/88 | HR 84 | Temp 98.7°F | Ht 67.0 in | Wt 256.0 lb

## 2022-04-15 DIAGNOSIS — M1000 Idiopathic gout, unspecified site: Secondary | ICD-10-CM

## 2022-04-15 DIAGNOSIS — I10 Essential (primary) hypertension: Secondary | ICD-10-CM

## 2022-04-15 MED ORDER — PREDNISONE 50 MG PO TABS: ORAL_TABLET | ORAL | 0 refills | Status: DC

## 2022-04-15 NOTE — Assessment & Plan Note (Signed)
At goal today off meds. 

## 2022-04-15 NOTE — Assessment & Plan Note (Signed)
Recent flare in left ankle left foot.  We will start prednisone to reduce inflammation.  He cannot take NSAIDs due to allergy.  We will continue his allopurinol 450 mg daily and colchicine as well. ?

## 2022-04-15 NOTE — Progress Notes (Signed)
? ?  Gerald Palmer is a 46 y.o. male who presents today for an office visit. ? ?Assessment/Plan:  ?New/Acute Problems: ?Right ankle pain ?Possibly due to gout flare though does have a history of Achilles tendon rupture about 15 years ago.  His Thompson squeeze test was normal today-do not think he has any large tears however small tear cannot be fully excluded.  He will follow-up with sports medicine for further evaluation and management.  We will be treating his gout flare with prednisone as below. ? ?Chronic Problems Addressed Today: ?Gout ?Recent flare in left ankle left foot.  We will start prednisone to reduce inflammation.  He cannot take NSAIDs due to allergy.  We will continue his allopurinol 450 mg daily and colchicine as well. ? ?Essential hypertension ?At goal today off meds. ? ? ?  ?Subjective:  ?HPI: ? ?Patient here with left ankle pain. Symptoms started about 2 weeks. He has been walking a lot and thinks he might have hurt it. Has had swelling but this had improved.  He does have a history of gout and has had several recent flares.  The pain he is feeling is consistent with prior gout flares.   He has tried massages with some improvement. No other specific treatment tried.  No obvious injuries or precipitating events.  Strength has been normal. ?   ?  ?Objective:  ?Physical Exam: ?BP 130/88   Pulse 84   Temp 98.7 ?F (37.1 ?C) (Temporal)   Ht 5\' 7"  (1.702 m)   Wt 256 lb (116.1 kg)   SpO2 97%   BMI 40.10 kg/m?   ?Gen: No acute distress, resting comfortably ?MSK: ?- Left Foot: Nodular deformity noted at left Achilles near site of surgical incision.  This area is tender to palpation.  Inferior to this deformity there is an area of induration that is painful on palpation.  Neurovascular intact distally.  Thompson test normal.  Normal strength with foot plantarflexion. ?Neuro: Grossly normal, moves all extremities ?Psych: Normal affect and thought content ? ?   ? ? ?I,Savera Zaman,acting as a  for Neurosurgeon, MD.,have documented all relevant documentation on the behalf of Jacquiline Doe, MD,as directed by  Jacquiline Doe, MD while in the presence of Jacquiline Doe, MD.  ? ?I, Jacquiline Doe, MD, have reviewed all documentation for this visit. The documentation on 04/15/22 for the exam, diagnosis, procedures, and orders are all accurate and complete. ? ?04/17/22. Katina Degree, MD ?04/15/2022 1:30 PM  ? ?

## 2022-04-15 NOTE — Patient Instructions (Signed)
It was very nice to see you today! ? ?You probably have gout in your ankle though it is possible that you may have a small Achilles tear as well.  Please try the prednisone to help reduce inflammation.  Please follow-up with Dr. Clovis Riley office soon. ? ?We can check a testosterone you come back in for your physical. ? ?Take care, ?Dr Jerline Pain ? ?PLEASE NOTE: ? ?If you had any lab tests please let us know if you have not heard back within a few days. You may see your results on mychart before we have a chance to review them but we will give you a call once they are reviewed by [place]. If we ordered any referrals today, please let us know if you have not heard from their office within the next week.  ? ?Please try these tips to maintain a healthy lifestyle: ? ?Eat at least 3 REAL meals and 1-2 snacks per day.  Aim for no more than 5 hours between eating.  If you eat breakfast, please do so within one hour of getting up.  ? ?Each meal should contain half fruits/vegetables, one quarter protein, and one quarter carbs (no bigger than a computer mouse) ? ?Cut down on sweet beverages. This includes juice, soda, and sweet tea.  ? ?Drink at least 1 glass of water with each meal and aim for at least 8 glasses per day ? ?Exercise at least 150 minutes every week.   ?

## 2022-04-19 ENCOUNTER — Ambulatory Visit: Payer: Self-pay

## 2022-04-19 ENCOUNTER — Ambulatory Visit (INDEPENDENT_AMBULATORY_CARE_PROVIDER_SITE_OTHER): Payer: Federal, State, Local not specified - PPO | Admitting: Family Medicine

## 2022-04-19 VITALS — BP 140/92 | HR 71 | Wt 261.0 lb

## 2022-04-19 DIAGNOSIS — M7662 Achilles tendinitis, left leg: Secondary | ICD-10-CM

## 2022-04-19 DIAGNOSIS — M79672 Pain in left foot: Secondary | ICD-10-CM | POA: Diagnosis not present

## 2022-04-19 DIAGNOSIS — G8929 Other chronic pain: Secondary | ICD-10-CM

## 2022-04-19 DIAGNOSIS — M7752 Other enthesopathy of left foot: Secondary | ICD-10-CM

## 2022-04-19 MED ORDER — NITROGLYCERIN 0.2 MG/HR TD PT24: 0.2000 mg | MEDICATED_PATCH | Freq: Every day | TRANSDERMAL | 1 refills | Status: DC

## 2022-04-19 NOTE — Patient Instructions (Addendum)
Thank you for coming in today.  ? ?Please complete the exercises that the athletic trainer went over with you:  View at www.my-exercise-code.com using code: [id number] ? ?Recheck back in 1 month ? ?Nitroglycerin Protocol ?Apply 1/4 nitroglycerin patch to affected area daily. ?Change position of patch within the affected area every 24 hours. ?You may experience a headache during the first 1-2 weeks of using the patch, these should subside. ?If you experience headaches after beginning nitroglycerin patch treatment, you may take your preferred over the counter pain reliever. ?Another side effect of the nitroglycerin patch is skin irritation or rash related to patch adhesive. ?Please notify our office if you develop more severe headaches or rash, and stop the patch. ?Tendon healing with nitroglycerin patch may require 12 to 24 weeks depending on the extent of injury. ?Men should not use if taking Viagra, Cialis, or Levitra.  ?Do not use if you have migraines or rosacea.   ?

## 2022-04-19 NOTE — Progress Notes (Signed)
? ?  I, Philbert Riser, LAT, ATC acting as a scribe for Clementeen Graham, MD. ? ?Gerald Palmer is a 46 y.o. male who presents to [facility] today for L Achilles pain. Pt was previously seen by Dr. Denyse Amass on 12/25/21 for a hematoma on his R lower leg and on 03/16/21 for a gout flare in his R ankle. PCP prescribed pt prednisone on 04/15/22 for a gout flare in his L ankle. Pt has a hx of an Achilles tendon rupture about 10 years ago. Today, pt c/o L Achilles pain x 2.5 weeks w/ no MOI. Pt locates pain to the distal part of the L Achilles. ? ?L Achilles swelling: yes ?Aggravates: walking ?Treatments tried: prednisone, naproxen, ice,  ? ?Dx testing: 12/20/21 R tib/fib XR ?08/31/21 Labs (uric acid = 9.0) ? 03/16/21 Labs (uric acid = 7.4) ?08/17/14 L ankle XR ? ?Pertinent review of systems: No fevers or chills ? ?Relevant historical information: Hypertension.  History of Achilles tendon repair ? ? ?Exam:  ?BP (!) 140/92   Pulse 71   Wt 261 lb (118.4 kg)   SpO2 96%   BMI 40.88 kg/m?  ?General: Well Developed, well nourished, and in no acute distress.  ? ?MSK: Left Achilles tendon: Mature scar posterior heel with slight swelling around the Achilles tendon area. ?Normal foot and ankle motion. ?Intact strength. ? ? ? ?Lab and Radiology Results ? ?Diagnostic Limited MSK Ultrasound of: Left Achilles tendon ?Tendon is thickened proximal to the insertion site.  Moderate.  Calcification mid substance tendon likely at the area of surgical repair. ?No visible tear is present. ?Mild retrocalcaneal bursitis is present. ?Impression: Calcific tendinitis and retrocalcaneal bursitis present. ? ? ? ?Assessment and Plan: ?46 y.o. 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  Plan to treat with eccentric exercises nitroglycerin patch protocol.  Plan to recheck back in 1 month.  Return sooner if needed. ? ? ?PDMP not reviewed this encounter. ?Orders Placed This Encounter  ?Procedures  ? [place]  LIMITED JOINT SPACE STRUCTURES LOW LEFT(NO LINKED CHARGES)  ?  Order Specific Question:   Reason for Exam (SYMPTOM  OR DIAGNOSIS REQUIRED)  ?  Answer:   left achilles pain  ?  Order Specific Question:   Preferred imaging location?  ?  Answer:   [facility]  ? ?Meds ordered this encounter  ?Medications  ? nitroGLYCERIN (NITRODUR - DOSED IN MG/24 HR) 0.2 mg/hr patch  ?  Sig: Place 1 patch (0.2 mg total) onto the skin daily. Place 1/4 of the patch over the affected area.  ?  Dispense:  30 patch  ?  Refill:  1  ? ? ? ?Discussed warning signs or symptoms. Please see discharge instructions. Patient expresses understanding. ? ? ?The above documentation has been reviewed and is accurate and complete Clementeen Graham, M.D. ? ? ?

## 2022-04-24 DIAGNOSIS — G4733 Obstructive sleep apnea (adult) (pediatric): Secondary | ICD-10-CM | POA: Diagnosis not present

## 2022-05-20 ENCOUNTER — Ambulatory Visit: Payer: Federal, State, Local not specified - PPO | Admitting: Family Medicine

## 2022-05-26 ENCOUNTER — Telehealth (INDEPENDENT_AMBULATORY_CARE_PROVIDER_SITE_OTHER): Payer: Federal, State, Local not specified - PPO | Admitting: Adult Health

## 2022-05-26 ENCOUNTER — Encounter: Payer: Self-pay | Admitting: Adult Health

## 2022-05-26 VITALS — Ht 67.0 in | Wt 255.0 lb

## 2022-05-26 DIAGNOSIS — J0141 Acute recurrent pansinusitis: Secondary | ICD-10-CM

## 2022-05-26 MED ORDER — DOXYCYCLINE HYCLATE 100 MG PO CAPS
100.0000 mg | ORAL_CAPSULE | Freq: Two times a day (BID) | ORAL | 0 refills | Status: DC
Start: 2022-05-26 — End: 2022-06-08

## 2022-05-26 NOTE — Progress Notes (Signed)
Virtual Visit via Video Note  I connected with Gerald Palmer on 05/26/22 at 11:30 AM EDT by a video enabled telemedicine application and verified that I am speaking with the correct person using two identifiers.  Location patient: home Location provider:work or home office Persons participating in the virtual visit: patient, provider  I discussed the limitations of evaluation and management by telemedicine and the availability of in person appointments. The patient expressed understanding and agreed to proceed.   HPI: 46 year old male who is being evaluated today for recurrent sinusitis.  His last sinus infection was in February 2023.  He reports that over the last week he has had facial pressure and nasal congestion on the left side of his face with sinus headache.  He has had some bloody mucus from his left nare.  He has not had any fevers, chills, wheezing, shortness of breath, ear pain, or cough.  At home he has been using Sudafed without relief.   ROS: See pertinent positives and negatives per HPI.  Past Medical History:  Diagnosis Date   Asthma    Hypertension     Past Surgical History:  Procedure Laterality Date   ACHILLES TENDON SURGERY     ANKLE FRACTURE SURGERY Right    TONSILLECTOMY      Family History  Problem Relation Age of Onset   Hypertension Mother    Arthritis Mother    Diabetes Mother    Hyperlipidemia Mother    Asthma Father    Hyperlipidemia Father    Hypertension Father    Stroke Father    Hypertension Brother    Hyperlipidemia Brother    Arthritis Maternal Grandmother        Current Outpatient Medications:    albuterol (PROVENTIL) (2.5 MG/3ML) 0.083% nebulizer solution, Take 3 mLs (2.5 mg total) by nebulization every 6 (six) hours as needed for wheezing or shortness of breath., Disp: 75 mL, Rfl: 1   albuterol (VENTOLIN HFA) 108 (90 Base) MCG/ACT inhaler, INHALE 2 PUFFS INTO THE LUNGS EVERY 4 TO 6 HOURS AS NEEDED FOR COUGH OR WHEEZING, Disp:  42.5 g, Rfl: 5   allopurinol (ZYLOPRIM) 300 MG tablet, Take 1.5 tablets (450 mg total) by mouth daily., Disp: 135 tablet, Rfl: 3   atorvastatin (LIPITOR) 40 MG tablet, TAKE 1 TABLET(40 MG) BY MOUTH DAILY, Disp: 90 tablet, Rfl: 3   azelastine (ASTELIN) 0.1 % nasal spray, Place 2 sprays into both nostrils 2 (two) times daily., Disp: 30 mL, Rfl: 12   budesonide-formoterol (SYMBICORT) 160-4.5 MCG/ACT inhaler, INHALE 2 PUFFS BY MOUTH TWICE DAILY, Disp: 30.6 g, Rfl: 1   cefUROXime (CEFTIN) 500 MG tablet, Take 1 tablet (500 mg total) by mouth 2 (two) times daily with a meal., Disp: 10 tablet, Rfl: 0   cetirizine (ZYRTEC) 10 MG tablet, Take 1 tablet (10 mg total) by mouth daily., Disp: 30 tablet, Rfl: 11   colchicine 0.6 MG tablet, TAKE 1 TABLET(0.6 MG) BY MOUTH DAILY AS NEEDED FOR GOUT, Disp: 30 tablet, Rfl: 5   diclofenac Sodium (VOLTAREN) 1 % GEL, Apply 2 g topically 4 (four) times daily., Disp: 50 g, Rfl: 0   doxycycline (VIBRAMYCIN) 100 MG capsule, Take 1 capsule (100 mg total) by mouth 2 (two) times daily., Disp: 14 capsule, Rfl: 0   gabapentin (NEURONTIN) 100 MG capsule, Take 1 capsule (100 mg total) by mouth 3 (three) times daily., Disp: 90 capsule, Rfl: 0   montelukast (SINGULAIR) 10 MG tablet, Take one tablet once daily, Disp: 90 tablet, Rfl: 3  nitroGLYCERIN (NITRODUR - DOSED IN MG/24 HR) 0.2 mg/hr patch, Place 1 patch (0.2 mg total) onto the skin daily. Place 1/4 of the patch over the affected area., Disp: 30 patch, Rfl: 1   ondansetron (ZOFRAN) 8 MG tablet, Take 1 tablet (8 mg total) by mouth every 8 (eight) hours as needed for nausea or vomiting., Disp: 20 tablet, Rfl: 0  EXAM:  VITALS per patient if applicable:  GENERAL: alert, oriented, appears well and in no acute distress  HEENT: atraumatic, conjunttiva clear, no obvious abnormalities on inspection of external nose and ears  NECK: normal movements of the head and neck  LUNGS: on inspection no signs of respiratory distress,  breathing rate appears normal, no obvious gross SOB, gasping or wheezing  CV: no obvious cyanosis  MS: moves all visible extremities without noticeable abnormality  PSYCH/NEURO: pleasant and cooperative, no obvious depression or anxiety, speech and thought processing grossly intact  ASSESSMENT AND PLAN:  Discussed the following assessment and plan:  1. Acute recurrent pansinusitis -Treat for recurrent sinusitis.  Work note placed in my chart.  Advise follow-up if no improvement in the next 2 to 3 days - doxycycline (VIBRAMYCIN) 100 MG capsule; Take 1 capsule (100 mg total) by mouth 2 (two) times daily.  Dispense: 14 capsule; Refill: 0      I discussed the assessment and treatment plan with the patient. The patient was provided an opportunity to ask questions and all were answered. The patient agreed with the plan and demonstrated an understanding of the instructions.   The patient was advised to call back or seek an in-person evaluation if the symptoms worsen or if the condition fails to improve as anticipated.   Shirline Frees, NP

## 2022-05-27 ENCOUNTER — Telehealth: Payer: Federal, State, Local not specified - PPO | Admitting: Adult Health

## 2022-06-08 ENCOUNTER — Telehealth (INDEPENDENT_AMBULATORY_CARE_PROVIDER_SITE_OTHER): Payer: Federal, State, Local not specified - PPO | Admitting: Adult Health

## 2022-06-08 DIAGNOSIS — J0141 Acute recurrent pansinusitis: Secondary | ICD-10-CM | POA: Diagnosis not present

## 2022-06-08 MED ORDER — AMOXICILLIN-POT CLAVULANATE 875-125 MG PO TABS: 1.0000 | ORAL_TABLET | Freq: Two times a day (BID) | ORAL | 0 refills | Status: DC

## 2022-06-08 NOTE — Progress Notes (Signed)
Virtual Visit via Video Note  I connected with Gerald Palmer  on 06/08/22 at  7:45 AM EDT by a video enabled telemedicine application and verified that I am speaking with the correct person using two identifiers.  Location patient: home Location provider:work or home office Persons participating in the virtual visit: patient, provider  I discussed the limitations of evaluation and management by telemedicine and the availability of in person appointments. The patient expressed understanding and agreed to proceed.   HPI: He was seen on 05/26/2022 for suspected sinusitis and prescribed doxycyline 100 mg BID - he started this 3 days later and took for 5 days and then stopped due to stomach cramping and diarrhea that resolved after stopping the antibiotic. He continues to have his sinusitis like symptoms. Headache, sore throat, sinus pressure and blood in mucus.    ROS: See pertinent positives and negatives per HPI.  Past Medical History:  Diagnosis Date   Asthma    Hypertension     Past Surgical History:  Procedure Laterality Date   ACHILLES TENDON SURGERY     ANKLE FRACTURE SURGERY Right    TONSILLECTOMY      Family History  Problem Relation Age of Onset   Hypertension Mother    Arthritis Mother    Diabetes Mother    Hyperlipidemia Mother    Asthma Father    Hyperlipidemia Father    Hypertension Father    Stroke Father    Hypertension Brother    Hyperlipidemia Brother    Arthritis Maternal Grandmother        Current Outpatient Medications:    albuterol (PROVENTIL) (2.5 MG/3ML) 0.083% nebulizer solution, Take 3 mLs (2.5 mg total) by nebulization every 6 (six) hours as needed for wheezing or shortness of breath., Disp: 75 mL, Rfl: 1   albuterol (VENTOLIN HFA) 108 (90 Base) MCG/ACT inhaler, INHALE 2 PUFFS INTO THE LUNGS EVERY 4 TO 6 HOURS AS NEEDED FOR COUGH OR WHEEZING, Disp: 42.5 g, Rfl: 5   allopurinol (ZYLOPRIM) 300 MG tablet, Take 1.5 tablets (450 mg total) by mouth  daily., Disp: 135 tablet, Rfl: 3   atorvastatin (LIPITOR) 40 MG tablet, TAKE 1 TABLET(40 MG) BY MOUTH DAILY, Disp: 90 tablet, Rfl: 3   azelastine (ASTELIN) 0.1 % nasal spray, Place 2 sprays into both nostrils 2 (two) times daily., Disp: 30 mL, Rfl: 12   budesonide-formoterol (SYMBICORT) 160-4.5 MCG/ACT inhaler, INHALE 2 PUFFS BY MOUTH TWICE DAILY, Disp: 30.6 g, Rfl: 1   cefUROXime (CEFTIN) 500 MG tablet, Take 1 tablet (500 mg total) by mouth 2 (two) times daily with a meal., Disp: 10 tablet, Rfl: 0   cetirizine (ZYRTEC) 10 MG tablet, Take 1 tablet (10 mg total) by mouth daily., Disp: 30 tablet, Rfl: 11   colchicine 0.6 MG tablet, TAKE 1 TABLET(0.6 MG) BY MOUTH DAILY AS NEEDED FOR GOUT, Disp: 30 tablet, Rfl: 5   diclofenac Sodium (VOLTAREN) 1 % GEL, Apply 2 g topically 4 (four) times daily., Disp: 50 g, Rfl: 0   doxycycline (VIBRAMYCIN) 100 MG capsule, Take 1 capsule (100 mg total) by mouth 2 (two) times daily., Disp: 14 capsule, Rfl: 0   gabapentin (NEURONTIN) 100 MG capsule, Take 1 capsule (100 mg total) by mouth 3 (three) times daily., Disp: 90 capsule, Rfl: 0   montelukast (SINGULAIR) 10 MG tablet, Take one tablet once daily, Disp: 90 tablet, Rfl: 3   nitroGLYCERIN (NITRODUR - DOSED IN MG/24 HR) 0.2 mg/hr patch, Place 1 patch (0.2 mg total) onto the skin  daily. Place 1/4 of the patch over the affected area., Disp: 30 patch, Rfl: 1   ondansetron (ZOFRAN) 8 MG tablet, Take 1 tablet (8 mg total) by mouth every 8 (eight) hours as needed for nausea or vomiting., Disp: 20 tablet, Rfl: 0  EXAM:  VITALS per patient if applicable:  GENERAL: alert, oriented, appears well and in no acute distress  HEENT: atraumatic, conjunttiva clear, no obvious abnormalities on inspection of external nose and ears  NECK: normal movements of the head and neck  LUNGS: on inspection no signs of respiratory distress, breathing rate appears normal, no obvious gross SOB, gasping or wheezing  CV: no obvious  cyanosis  MS: moves all visible extremities without noticeable abnormality  PSYCH/NEURO: pleasant and cooperative, no obvious depression or anxiety, speech and thought processing grossly intact  ASSESSMENT AND PLAN:  Discussed the following assessment and plan:  1. Acute recurrent pansinusitis - advised that any antibiotic can cause GI issues. Advised taking abx with food. Start probiotic while taking Augmentin  - amoxicillin-clavulanate (AUGMENTIN) 875-125 MG tablet; Take 1 tablet by mouth 2 (two) times daily.  Dispense: 20 tablet; Refill: 0     I discussed the assessment and treatment plan with the patient. The patient was provided an opportunity to ask questions and all were answered. The patient agreed with the plan and demonstrated an understanding of the instructions.   The patient was advised to call back or seek an in-person evaluation if the symptoms worsen or if the condition fails to improve as anticipated.   Shirline Frees, NP

## 2022-06-24 ENCOUNTER — Telehealth: Payer: Federal, State, Local not specified - PPO | Admitting: Adult Health

## 2022-08-20 ENCOUNTER — Telehealth: Payer: Federal, State, Local not specified - PPO | Admitting: Physician Assistant

## 2022-08-20 DIAGNOSIS — J019 Acute sinusitis, unspecified: Secondary | ICD-10-CM

## 2022-08-20 DIAGNOSIS — B9689 Other specified bacterial agents as the cause of diseases classified elsewhere: Secondary | ICD-10-CM | POA: Diagnosis not present

## 2022-08-20 MED ORDER — PREDNISONE 20 MG PO TABS: 40.0000 mg | ORAL_TABLET | Freq: Every day | ORAL | 0 refills | Status: DC

## 2022-08-20 MED ORDER — LEVOFLOXACIN 500 MG PO TABS: 500.0000 mg | ORAL_TABLET | Freq: Every day | ORAL | 0 refills | Status: DC

## 2022-08-20 MED ORDER — BENZONATATE 100 MG PO CAPS: 100.0000 mg | ORAL_CAPSULE | Freq: Three times a day (TID) | ORAL | 0 refills | Status: DC | PRN

## 2022-08-20 NOTE — Patient Instructions (Signed)
Gerald Palmer, thank you for joining Piedad Climes, PA-C for today's virtual visit.  While this provider is not your primary care provider (PCP), if your PCP is located in our provider database this encounter information will be shared with them immediately following your visit.  Consent: (Patient) Gerald Palmer provided verbal consent for this virtual visit at the beginning of the encounter.  Current Medications:  Current Outpatient Medications:    albuterol (PROVENTIL) (2.5 MG/3ML) 0.083% nebulizer solution, Take 3 mLs (2.5 mg total) by nebulization every 6 (six) hours as needed for wheezing or shortness of breath., Disp: 75 mL, Rfl: 1   albuterol (VENTOLIN HFA) 108 (90 Base) MCG/ACT inhaler, INHALE 2 PUFFS INTO THE LUNGS EVERY 4 TO 6 HOURS AS NEEDED FOR COUGH OR WHEEZING, Disp: 42.5 g, Rfl: 5   allopurinol (ZYLOPRIM) 300 MG tablet, Take 1.5 tablets (450 mg total) by mouth daily., Disp: 135 tablet, Rfl: 3   amoxicillin-clavulanate (AUGMENTIN) 875-125 MG tablet, Take 1 tablet by mouth 2 (two) times daily., Disp: 20 tablet, Rfl: 0   atorvastatin (LIPITOR) 40 MG tablet, TAKE 1 TABLET(40 MG) BY MOUTH DAILY, Disp: 90 tablet, Rfl: 3   azelastine (ASTELIN) 0.1 % nasal spray, Place 2 sprays into both nostrils 2 (two) times daily., Disp: 30 mL, Rfl: 12   budesonide-formoterol (SYMBICORT) 160-4.5 MCG/ACT inhaler, INHALE 2 PUFFS BY MOUTH TWICE DAILY, Disp: 30.6 g, Rfl: 1   cefUROXime (CEFTIN) 500 MG tablet, Take 1 tablet (500 mg total) by mouth 2 (two) times daily with a meal., Disp: 10 tablet, Rfl: 0   cetirizine (ZYRTEC) 10 MG tablet, Take 1 tablet (10 mg total) by mouth daily., Disp: 30 tablet, Rfl: 11   colchicine 0.6 MG tablet, TAKE 1 TABLET(0.6 MG) BY MOUTH DAILY AS NEEDED FOR GOUT, Disp: 30 tablet, Rfl: 5   diclofenac Sodium (VOLTAREN) 1 % GEL, Apply 2 g topically 4 (four) times daily., Disp: 50 g, Rfl: 0   gabapentin (NEURONTIN) 100 MG capsule, Take 1 capsule (100 mg total) by mouth 3  (three) times daily., Disp: 90 capsule, Rfl: 0   montelukast (SINGULAIR) 10 MG tablet, Take one tablet once daily, Disp: 90 tablet, Rfl: 3   nitroGLYCERIN (NITRODUR - DOSED IN MG/24 HR) 0.2 mg/hr patch, Place 1 patch (0.2 mg total) onto the skin daily. Place 1/4 of the patch over the affected area., Disp: 30 patch, Rfl: 1   ondansetron (ZOFRAN) 8 MG tablet, Take 1 tablet (8 mg total) by mouth every 8 (eight) hours as needed for nausea or vomiting., Disp: 20 tablet, Rfl: 0   Medications ordered in this encounter:  No orders of the defined types were placed in this encounter.    *If you need refills on other medications prior to your next appointment, please contact your pharmacy*  Follow-Up: Call back or seek an in-person evaluation if the symptoms worsen or if the condition fails to improve as anticipated.  Other Instructions Please take antibiotic as directed.  Increase fluid intake.  Use Saline nasal spray.  Take a daily multivitamin. Make sure to take the steroid and tessalon as directed.  Place a humidifier in the bedroom.  Please call or return clinic if symptoms are not improving.  Sinusitis Sinusitis is redness, soreness, and swelling (inflammation) of the paranasal sinuses. Paranasal sinuses are air pockets within the bones of your face (beneath the eyes, the middle of the forehead, or above the eyes). In healthy paranasal sinuses, mucus is able to drain out, and air is able  to circulate through them by way of your nose. However, when your paranasal sinuses are inflamed, mucus and air can become trapped. This can allow bacteria and other germs to grow and cause infection. Sinusitis can develop quickly and last only a short time (acute) or continue over a long period (chronic). Sinusitis that lasts for more than 12 weeks is considered chronic.  CAUSES  Causes of sinusitis include: Allergies. Structural abnormalities, such as displacement of the cartilage that separates your nostrils  (deviated septum), which can decrease the air flow through your nose and sinuses and affect sinus drainage. Functional abnormalities, such as when the small hairs (cilia) that line your sinuses and help remove mucus do not work properly or are not present. SYMPTOMS  Symptoms of acute and chronic sinusitis are the same. The primary symptoms are pain and pressure around the affected sinuses. Other symptoms include: Upper toothache. Earache. Headache. Bad breath. Decreased sense of smell and taste. A cough, which worsens when you are lying flat. Fatigue. Fever. Thick drainage from your nose, which often is green and may contain pus (purulent). Swelling and warmth over the affected sinuses. DIAGNOSIS  Your caregiver will perform a physical exam. During the exam, your caregiver may: Look in your nose for signs of abnormal growths in your nostrils (nasal polyps). Tap over the affected sinus to check for signs of infection. View the inside of your sinuses (endoscopy) with a special imaging device with a light attached (endoscope), which is inserted into your sinuses. If your caregiver suspects that you have chronic sinusitis, one or more of the following tests may be recommended: Allergy tests. Nasal culture A sample of mucus is taken from your nose and sent to a lab and screened for bacteria. Nasal cytology A sample of mucus is taken from your nose and examined by your caregiver to determine if your sinusitis is related to an allergy. TREATMENT  Most cases of acute sinusitis are related to a viral infection and will resolve on their own within 10 days. Sometimes medicines are prescribed to help relieve symptoms (pain medicine, decongestants, nasal steroid sprays, or saline sprays).  However, for sinusitis related to a bacterial infection, your caregiver will prescribe antibiotic medicines. These are medicines that will help kill the bacteria causing the infection.  Rarely, sinusitis is caused by  a fungal infection. In theses cases, your caregiver will prescribe antifungal medicine. For some cases of chronic sinusitis, surgery is needed. Generally, these are cases in which sinusitis recurs more than 3 times per year, despite other treatments. HOME CARE INSTRUCTIONS  Drink plenty of water. Water helps thin the mucus so your sinuses can drain more easily. Use a humidifier. Inhale steam 3 to 4 times a day (for example, sit in the bathroom with the shower running). Apply a warm, moist washcloth to your face 3 to 4 times a day, or as directed by your caregiver. Use saline nasal sprays to help moisten and clean your sinuses. Take over-the-counter or prescription medicines for pain, discomfort, or fever only as directed by your caregiver. SEEK IMMEDIATE MEDICAL CARE IF: You have increasing pain or severe headaches. You have nausea, vomiting, or drowsiness. You have swelling around your face. You have vision problems. You have a stiff neck. You have difficulty breathing. MAKE SURE YOU:  Understand these instructions. Will watch your condition. Will get help right away if you are not doing well or get worse. Document Released: 12/06/2005 Document Revised: 02/28/2012 Document Reviewed: 12/21/2011 Exit[employer] Patient Information 2014 [employer]    If you have been instructed to have an in-person evaluation today at a local Urgent Care facility, please use the link below. It will take you to a list of all of our available [facility], including address, phone number and hours of operation. Please do not delay care.  [facility]  If you or a family member do not have a primary care provider, use the link below to schedule a visit and establish care. When you choose a [facility] primary care physician or advanced practice provider, you gain a long-term partner in health. Find a Primary Care Provider  Learn more about [facility] in-office and virtual care  options: [facility] - Get Care Now

## 2022-08-20 NOTE — Progress Notes (Signed)
Virtual Visit Consent   Gerald Palmer, you are scheduled for a virtual visit with a [facility] provider today. Just as with appointments in the office, your consent must be obtained to participate. Your consent will be active for this visit and any virtual visit you may have with one of our providers in the next 365 days. If you have a MyChart account, a copy of this consent can be sent to you electronically.  As this is a virtual visit, video technology does not allow for your provider to perform a traditional examination. This may limit your provider's ability to fully assess your condition. If your provider identifies any concerns that need to be evaluated in person or the need to arrange testing (such as labs, EKG, etc.), we will make arrangements to do so. Although advances in technology are sophisticated, we cannot ensure that it will always work on either your end or our end. If the connection with a video visit is poor, the visit may have to be switched to a telephone visit. With either a video or telephone visit, we are not always able to ensure that we have a secure connection.  By engaging in this virtual visit, you consent to the provision of healthcare and authorize for your insurance to be billed (if applicable) for the services provided during this visit. Depending on your insurance coverage, you may receive a charge related to this service.  I need to obtain your verbal consent now. Are you willing to proceed with your visit today? Gerald Palmer has provided verbal consent on 08/20/2022 for a virtual visit (video or telephone). Gerald Palmer, [place]  Date: 08/20/2022 1:12 PM  Virtual Visit via Video Note   I, Gerald Palmer, connected with  Gerald Palmer  ([id number], 1976/06/02) on 08/20/22 at  1:00 PM EDT by a video-enabled telemedicine application and verified that I am speaking with the correct person using two identifiers.  Location: Patient: Virtual Visit Location  Patient: Home Provider: Virtual Visit Location Provider: Home Office   I discussed the limitations of evaluation and management by telemedicine and the availability of in person appointments. The patient expressed understanding and agreed to proceed.    History of Present Illness: Gerald Palmer is a 46 y.o. who identifies as a male who was assigned male at birth, and is being seen today for flare of sinus symptoms over the past week. Has history of recurrent sinusitis. Notes last treatment a few months ago with Doxy and then Augmentin. Notes symptoms really worsening over past 3 days with sinus pain, facial pain and fatigue. Starting to affect asthma slightly. Denies any chest pain. Denies recent travel. Home COVID test taken and negative. Has been taking his chronic allergy medication regimen as directed.Marland Kitchen  HPI: HPI  Problems:  Patient Active Problem List   Diagnosis Date Noted   Essential hypertension 08/04/2021   Ulnar neuropathy of both upper extremities 08/04/2021   Meralgia paresthetica 10/05/2019   Carpal tunnel syndrome, bilateral 08/14/2019   Severe obstructive sleep apnea-hypopnea syndrome 05/15/2019   Dyslipidemia 08/08/2018   Hyperglycemia 08/08/2018   Depression, major, single episode, mild (HCC) 08/07/2018   Gout 03/21/2018   Knee pain 12/30/2017   Allergic rhinitis 11/14/2017   Asthma 10/11/2017    Allergies:  Allergies  Allergen Reactions   Aspirin     unknown   Ibuprofen Hives    High doses cause hives   Nsaids Hives   Shellfish Allergy Itching    Eyes swell  Medications:  Current Outpatient Medications:    benzonatate (TESSALON) 100 MG capsule, Take 1 capsule (100 mg total) by mouth 3 (three) times daily as needed for cough., Disp: 30 capsule, Rfl: 0   levofloxacin (LEVAQUIN) 500 MG tablet, Take 1 tablet (500 mg total) by mouth daily., Disp: 7 tablet, Rfl: 0   predniSONE (DELTASONE) 20 MG tablet, Take 2 tablets (40 mg total) by mouth daily with breakfast.,  Disp: 6 tablet, Rfl: 0   albuterol (PROVENTIL) (2.5 MG/3ML) 0.083% nebulizer solution, Take 3 mLs (2.5 mg total) by nebulization every 6 (six) hours as needed for wheezing or shortness of breath., Disp: 75 mL, Rfl: 1   albuterol (VENTOLIN HFA) 108 (90 Base) MCG/ACT inhaler, INHALE 2 PUFFS INTO THE LUNGS EVERY 4 TO 6 HOURS AS NEEDED FOR COUGH OR WHEEZING, Disp: 42.5 g, Rfl: 5   allopurinol (ZYLOPRIM) 300 MG tablet, Take 1.5 tablets (450 mg total) by mouth daily., Disp: 135 tablet, Rfl: 3   atorvastatin (LIPITOR) 40 MG tablet, TAKE 1 TABLET(40 MG) BY MOUTH DAILY, Disp: 90 tablet, Rfl: 3   azelastine (ASTELIN) 0.1 % nasal spray, Place 2 sprays into both nostrils 2 (two) times daily., Disp: 30 mL, Rfl: 12   budesonide-formoterol (SYMBICORT) 160-4.5 MCG/ACT inhaler, INHALE 2 PUFFS BY MOUTH TWICE DAILY, Disp: 30.6 g, Rfl: 1   cetirizine (ZYRTEC) 10 MG tablet, Take 1 tablet (10 mg total) by mouth daily., Disp: 30 tablet, Rfl: 11   colchicine 0.6 MG tablet, TAKE 1 TABLET(0.6 MG) BY MOUTH DAILY AS NEEDED FOR GOUT, Disp: 30 tablet, Rfl: 5   diclofenac Sodium (VOLTAREN) 1 % GEL, Apply 2 g topically 4 (four) times daily., Disp: 50 g, Rfl: 0   gabapentin (NEURONTIN) 100 MG capsule, Take 1 capsule (100 mg total) by mouth 3 (three) times daily., Disp: 90 capsule, Rfl: 0   montelukast (SINGULAIR) 10 MG tablet, Take one tablet once daily, Disp: 90 tablet, Rfl: 3   nitroGLYCERIN (NITRODUR - DOSED IN MG/24 HR) 0.2 mg/hr patch, Place 1 patch (0.2 mg total) onto the skin daily. Place 1/4 of the patch over the affected area., Disp: 30 patch, Rfl: 1   ondansetron (ZOFRAN) 8 MG tablet, Take 1 tablet (8 mg total) by mouth every 8 (eight) hours as needed for nausea or vomiting., Disp: 20 tablet, Rfl: 0  Observations/Objective: Patient is well-developed, well-nourished in no acute distress.  Resting comfortably at home.  Head is normocephalic, atraumatic.  No labored breathing. Speech is clear and coherent with logical  content.  Patient is alert and oriented at baseline.   Assessment and Plan: 1. Acute bacterial sinusitis - levofloxacin (LEVAQUIN) 500 MG tablet; Take 1 tablet (500 mg total) by mouth daily.  Dispense: 7 tablet; Refill: 0 - predniSONE (DELTASONE) 20 MG tablet; Take 2 tablets (40 mg total) by mouth daily with breakfast.  Dispense: 6 tablet; Refill: 0 - benzonatate (TESSALON) 100 MG capsule; Take 1 capsule (100 mg total) by mouth 3 (three) times daily as needed for cough.  Dispense: 30 capsule; Refill: 0  Rx Levaquin giving need for multiple antibiotics last go round -- Augmentin/Doxycycline.  Increase fluids.  Rest.  Saline nasal spray.  Probiotic.  Mucinex as directed.  Humidifier in bedroom. Prednisone burst given to help with sinus inflammation and to help settle asthma. Continue regular asthma and allergy medications. Tessalon per orders.  Call or return to clinic if symptoms are not improving.   Follow Up Instructions: I discussed the assessment and treatment plan with the patient.  The patient was provided an opportunity to ask questions and all were answered. The patient agreed with the plan and demonstrated an understanding of the instructions.  A copy of instructions were sent to the patient via MyChart unless otherwise noted below.   The patient was advised to call back or seek an in-person evaluation if the symptoms worsen or if the condition fails to improve as anticipated.  Time:  I spent 10 minutes with the patient via telehealth technology discussing the above problems/concerns.    Gerald Climes, PA-C

## 2022-08-25 DIAGNOSIS — G4733 Obstructive sleep apnea (adult) (pediatric): Secondary | ICD-10-CM | POA: Diagnosis not present

## 2022-08-26 ENCOUNTER — Encounter: Payer: Self-pay | Admitting: Family Medicine

## 2022-08-26 NOTE — Telephone Encounter (Signed)
Please advise 

## 2022-08-27 NOTE — Telephone Encounter (Signed)
Ok to refill once but he needs to be seen here if not improving.  Katina Degree. [name], MD 08/27/2022 12:49 PM

## 2022-08-28 ENCOUNTER — Other Ambulatory Visit: Payer: Self-pay | Admitting: *Deleted

## 2022-08-28 DIAGNOSIS — B9689 Other specified bacterial agents as the cause of diseases classified elsewhere: Secondary | ICD-10-CM

## 2022-08-28 MED ORDER — PREDNISONE 20 MG PO TABS: 40.0000 mg | ORAL_TABLET | Freq: Every day | ORAL | 0 refills | Status: DC

## 2022-09-13 ENCOUNTER — Encounter: Payer: Self-pay | Admitting: *Deleted

## 2022-09-24 DIAGNOSIS — G4733 Obstructive sleep apnea (adult) (pediatric): Secondary | ICD-10-CM | POA: Diagnosis not present

## 2022-09-28 ENCOUNTER — Other Ambulatory Visit: Payer: Self-pay | Admitting: Family Medicine

## 2022-09-28 DIAGNOSIS — J45901 Unspecified asthma with (acute) exacerbation: Secondary | ICD-10-CM

## 2022-10-25 DIAGNOSIS — G4733 Obstructive sleep apnea (adult) (pediatric): Secondary | ICD-10-CM | POA: Diagnosis not present

## 2022-11-18 ENCOUNTER — Telehealth: Payer: Federal, State, Local not specified - PPO | Admitting: Adult Health

## 2022-11-18 ENCOUNTER — Encounter: Payer: Self-pay | Admitting: Family Medicine

## 2022-11-18 ENCOUNTER — Telehealth (INDEPENDENT_AMBULATORY_CARE_PROVIDER_SITE_OTHER): Payer: Federal, State, Local not specified - PPO | Admitting: Family Medicine

## 2022-11-18 VITALS — Ht 67.0 in | Wt 258.0 lb

## 2022-11-18 DIAGNOSIS — J0191 Acute recurrent sinusitis, unspecified: Secondary | ICD-10-CM

## 2022-11-18 DIAGNOSIS — J4540 Moderate persistent asthma, uncomplicated: Secondary | ICD-10-CM | POA: Diagnosis not present

## 2022-11-18 MED ORDER — AMOXICILLIN-POT CLAVULANATE 875-125 MG PO TABS: 1.0000 | ORAL_TABLET | Freq: Two times a day (BID) | ORAL | 0 refills | Status: DC

## 2022-11-18 MED ORDER — PREDNISONE 20 MG PO TABS: 40.0000 mg | ORAL_TABLET | Freq: Every day | ORAL | 0 refills | Status: DC

## 2022-11-18 NOTE — Progress Notes (Signed)
Virtual Visit via Video Note  I connected with Gerald Palmer on 11/18/22 at 4:15 PM by a video enabled telemedicine application and verified that I am speaking with the correct person using two identifiers.  Patient location: in car. Stopped, by self.  My location: office - [place].    I discussed the limitations, risks, security and privacy concerns of performing an evaluation and management service by telephone and the availability of in person appointments. I also discussed with the patient that there may be a patient responsible charge related to this service. The patient expressed understanding and agreed to proceed, consent obtained  Chief complaint:  Chief Complaint  Patient presents with   Nasal Congestion    Pt states he has blood in his mucous, sinus pressure, headaches, congestion. Pt states it has been going on for 4 days, pt has not tried a covid test     History of Present Illness: Gerald Palmer is a 46 y.o. male  Headache, congestion, sinus pressure: Started with HA, stuffy nose, blood tinged and green mucus 4 days ago.  Some discolored mucus. Some frontal headache.  No chest pain. Some wheezing, minimal. Using symbicort consistently 2 puffs bid. Usually albuterol less than once per day. Recently albuterol 4 times per day past 5-6 days - asthma flared with weather change. Nebulizer twice.  No covid testing. No sick contacts.   No recent covid booster or covid infection.  Tx:   History of asthma, treated with prednisone for flare in September, along with acute bacterial sinusitis, treated with Levaquin 500 mg daily for 7 days.  Patient Active Problem List   Diagnosis Date Noted   Essential hypertension 08/04/2021   Ulnar neuropathy of both upper extremities 08/04/2021   Meralgia paresthetica 10/05/2019   Carpal tunnel syndrome, bilateral 08/14/2019   Severe obstructive sleep apnea-hypopnea syndrome 05/15/2019   Dyslipidemia 08/08/2018   Hyperglycemia  08/08/2018   Gout 03/21/2018   Knee pain 12/30/2017   Allergic rhinitis 11/14/2017   Asthma 10/11/2017   Past Medical History:  Diagnosis Date   Asthma    Hypertension    Past Surgical History:  Procedure Laterality Date   ACHILLES TENDON SURGERY     ANKLE FRACTURE SURGERY Right    TONSILLECTOMY     Allergies  Allergen Reactions   Aspirin     unknown   Ibuprofen Hives    High doses cause hives   Nsaids Hives   Shellfish Allergy Itching    Eyes swell   Prior to Admission medications   Medication Sig Start Date End Date Taking? Authorizing Provider  albuterol (PROVENTIL) (2.5 MG/3ML) 0.083% nebulizer solution Take 3 mLs (2.5 mg total) by nebulization every 6 (six) hours as needed for wheezing or shortness of breath. 02/04/22  Yes Ardith Dark, MD  albuterol (VENTOLIN HFA) 108 (90 Base) MCG/ACT inhaler INHALE 2 PUFFS INTO THE LUNGS EVERY 4 TO 6 HOURS AS NEEDED FOR COUGH OR WHEEZING 12/03/21  Yes Ardith Dark, MD  allopurinol (ZYLOPRIM) 300 MG tablet Take 1.5 tablets (450 mg total) by mouth daily. 08/31/21  Yes Ardith Dark, MD  atorvastatin (LIPITOR) 40 MG tablet TAKE 1 TABLET(40 MG) BY MOUTH DAILY 08/31/21  Yes Ardith Dark, MD  azelastine (ASTELIN) 0.1 % nasal spray Place 2 sprays into both nostrils 2 (two) times daily. 02/06/20  Yes Ardith Dark, MD  budesonide-formoterol [facility]) 160-4.5 MCG/ACT inhaler INHALE 2 PUFFS BY MOUTH TWICE DAILY 09/29/22  Yes Ardith Dark, MD  colchicine  0.6 MG tablet TAKE 1 TABLET(0.6 MG) BY MOUTH DAILY AS NEEDED FOR GOUT 08/31/21  Yes Ardith Dark, MD  diclofenac Sodium (VOLTAREN) 1 % GEL Apply 2 g topically 4 (four) times daily. 10/27/21  Yes Tegeler, Canary Brim, MD  gabapentin (NEURONTIN) 100 MG capsule Take 1 capsule (100 mg total) by mouth 3 (three) times daily. 08/31/21  Yes Ardith Dark, MD  benzonatate (TESSALON) 100 MG capsule Take 1 capsule (100 mg total) by mouth 3 (three) times daily as needed for cough. Patient  not taking: Reported on 11/18/2022 08/20/22   Waldon Merl, PA-C  cetirizine (ZYRTEC) 10 MG tablet Take 1 tablet (10 mg total) by mouth daily. Patient not taking: Reported on 11/18/2022 03/15/19   Ardith Dark, MD  levofloxacin (LEVAQUIN) 500 MG tablet Take 1 tablet (500 mg total) by mouth daily. Patient not taking: Reported on 11/18/2022 08/20/22   Waldon Merl, PA-C  montelukast (SINGULAIR) 10 MG tablet Take one tablet once daily Patient not taking: Reported on 11/18/2022 08/31/21   Ardith Dark, MD  nitroGLYCERIN (NITRODUR - DOSED IN MG/24 HR) 0.2 mg/hr patch Place 1 patch (0.2 mg total) onto the skin daily. Place 1/4 of the patch over the affected area. Patient not taking: Reported on 11/18/2022 04/19/22   Rodolph Bong, MD  ondansetron (ZOFRAN) 8 MG tablet Take 1 tablet (8 mg total) by mouth every 8 (eight) hours as needed for nausea or vomiting. Patient not taking: Reported on 11/18/2022 12/08/21   Ardith Dark, MD  predniSONE (DELTASONE) 20 MG tablet Take 2 tablets (40 mg total) by mouth daily with breakfast. Patient not taking: Reported on 11/18/2022 08/28/22   Ardith Dark, MD   Social History   Socioeconomic History   Marital status: Single    Spouse name: Not on file   Number of children: Not on file   Years of education: Not on file   Highest education level: Not on file  Occupational History   Not on file  Tobacco Use   Smoking status: Former   Smokeless tobacco: Never  Vaping Use   Vaping Use: Never used  Substance and Sexual Activity   Alcohol use: Yes    Comment: occasionally   Drug use: No   Sexual activity: Yes  Other Topics Concern   Not on file  Social History Narrative   Not on file   Social Determinants of Health   Financial Resource Strain: Not on file  Food Insecurity: Not on file  Transportation Needs: Not on file  Physical Activity: Not on file  Stress: Not on file  Social Connections: Not on file  Intimate Partner Violence: Not on  file    Observations/Objective: Vitals:   11/18/22 1550  Weight: 258 lb (117 kg)  Height: 5\' 7"  (1.702 m)  Nontoxic appearance on video, speaking full sentences, no distress.  No audible wheeze or stridor.  Euthymic mood.  All questions answered with understanding of plan expressed.  Assessment and Plan: Moderate persistent asthma, unspecified whether complicated - Plan: predniSONE (DELTASONE) 20 MG tablet, amoxicillin-clavulanate (AUGMENTIN) 875-125 MG tablet  Acute recurrent sinusitis, unspecified location - Plan: predniSONE (DELTASONE) 20 MG tablet, amoxicillin-clavulanate (AUGMENTIN) 875-125 MG tablet Worsening asthma control approximately 5 to 6 days ago, then headache, discolored nasal congestion with blood-tinged nasal discharge past 4 days.  Possibly early secondary bacterial sinusitis, prior sinusitis.  Afebrile.  -Check COVID testing at home although unlikely positive.  If he is positive, additional day left  for antiviral.  Advised to call if positive.4  -Prednisone 40 mg daily x5 days, potential side effects and risks were discussed.  Continue Symbicort, albuterol if needed.  Advised to follow-up with PCP if asthma control does not improve after acute treatment with prednisone.  ER precautions given.  -Start Augmentin for sinusitis.  Prior quinolone but early symptoms and potential tendinopathy, other quinolone risks discussed.  We will start with Augmentin.  RTC/ER precautions.  Follow Up Instructions:    I discussed the assessment and treatment plan with the patient. The patient was provided an opportunity to ask questions and all were answered. The patient agreed with the plan and demonstrated an understanding of the instructions.   The patient was advised to call back or seek an in-person evaluation if the symptoms worsen or if the condition fails to improve as anticipated.   Shade Flood, MD

## 2022-12-18 ENCOUNTER — Other Ambulatory Visit: Payer: Self-pay | Admitting: Family Medicine

## 2023-01-12 ENCOUNTER — Ambulatory Visit (INDEPENDENT_AMBULATORY_CARE_PROVIDER_SITE_OTHER): Payer: Federal, State, Local not specified - PPO | Admitting: Family Medicine

## 2023-01-12 ENCOUNTER — Encounter: Payer: Self-pay | Admitting: Family Medicine

## 2023-01-12 VITALS — BP 147/80 | HR 71 | Temp 97.1°F | Ht 67.0 in | Wt 266.0 lb

## 2023-01-12 DIAGNOSIS — I10 Essential (primary) hypertension: Secondary | ICD-10-CM

## 2023-01-12 DIAGNOSIS — M25469 Effusion, unspecified knee: Secondary | ICD-10-CM

## 2023-01-12 DIAGNOSIS — M25461 Effusion, right knee: Secondary | ICD-10-CM | POA: Diagnosis not present

## 2023-01-12 DIAGNOSIS — J45901 Unspecified asthma with (acute) exacerbation: Secondary | ICD-10-CM

## 2023-01-12 MED ORDER — METHYLPREDNISOLONE ACETATE 80 MG/ML IJ SUSP
80.0000 mg | Freq: Once | INTRAMUSCULAR | Status: AC
  Administered 2023-01-12: 80 mg via INTRA_ARTICULAR

## 2023-01-12 MED ORDER — DICLOFENAC SODIUM 75 MG PO TBEC: 75.0000 mg | DELAYED_RELEASE_TABLET | Freq: Two times a day (BID) | ORAL | 0 refills | Status: DC

## 2023-01-12 MED ORDER — BUDESONIDE-FORMOTEROL FUMARATE 160-4.5 MCG/ACT IN AERO: 2.0000 | INHALATION_SPRAY | Freq: Two times a day (BID) | RESPIRATORY_TRACT | 1 refills | Status: DC

## 2023-01-12 NOTE — Progress Notes (Addendum)
   Gerald Palmer is a 47 y.o. male who presents today for an office visit.  Assessment/Plan:  New/Acute Problems: Right Knee Pain Likely osteoarthritis flare though did have bloody effusion on aspiration today.  Stable to varus and valgus stress today and no mechanism of injury consistent with ligament injury.  May have underlying degenerative meniscal tear.  We discussed trial of conservative management vs aspiration/injection. We performed steroid injection as below.  See below procedure note.  He tolerated well.  Discussed return precautions and warning signs for infection.  He will continue with ice and compression.  Will also send a small supply of diclofenac -he does have NSAIDs listed as an allergy on his list however he has tolerated diclofenac well in the past and has also been tolerating his Aleve.  He will let me know if this is not improving and would consider referral to sports medicine or orthopedics at that time to rule out ligamentous or meniscal injury.  Chronic Problems Addressed Today: Essential hypertension Slightly elevated today in setting acute pain.  Typically has been well-controlled off meds.  He will continue to monitor and let us know if persistently elevated.  Asthma Overall stable.  Continue Symbicort and albuterol.  Will refill Symbicort today.     Subjective:  HPI:  See A/p for status of chronic conditions.   Main concern today is right knee pain and swelling. Started 5 days ago. Was helping a friend move and did a lot of lifting.  No falls or obvious injuries.  He had something similar several years ago.  He has tried using ice and Aleve with some improvement.  Is also been using compression.  He had an MRI about 5 years ago which showed degenerative changes in his right knee.      Objective:  Physical Exam: BP (!) 147/80   Pulse 71   Temp (!) 97.1 F (36.2 C) (Temporal)   Ht 5\' 7"  (1.702 m)   Wt 266 lb (120.7 kg)   SpO2 98%   BMI 41.66 kg/m    Gen: No acute distress, resting comfortably CV: Regular rate and rhythm with no murmurs appreciated Pulm: Normal work of breathing, clear to auscultation bilaterally with no crackles, wheezes, or rhonchi MSK - Right knee: Gross effusion noted.  Limited range of motion due to swelling.  Stable to varus and valgus stress.  Negative anterior and posterior drawer signs.  Neurovascular intact distally. Neuro: Grossly normal, moves all extremities Psych: Normal affect and thought content  Knee Arthrocentesis with Injection Procedure Note  Pre-operative Diagnosis: right knee effusion  Post-operative Diagnosis: same  Indications: Symptomatic relief of large effusion  Anesthesia: Lidocaine 1% without epinephrine without added sodium bicarbonate  Procedure Details   Verbal consent was obtained for the procedure after discussing risks and benefits. The joint was prepped with Betadine and a small wheel of anesthetic was injected into the subcutaneous tissue. An 18 gauge needle was inserted into the superior aspect of the joint from a lateral approach. 5 ml of grossly bloody fluid was removed from the joint and discarded. 3 ml 1% lidocaine and 1 ml of 80mg /cc depomedrol was then injected into the joint. The needle was removed and the area cleansed and dressed.  Complications:  None; patient tolerated the procedure well.      Algis Greenhouse. Jerline Pain, MD 01/12/2023 8:32 AM

## 2023-01-12 NOTE — Assessment & Plan Note (Signed)
Overall stable.  Continue Symbicort and albuterol.  Will refill Symbicort today.

## 2023-01-12 NOTE — Assessment & Plan Note (Signed)
Slightly elevated today in setting acute pain.  Typically has been well-controlled off meds.  He will continue to monitor and let us know if persistently elevated.

## 2023-01-12 NOTE — Patient Instructions (Signed)
It was very nice to see you today!  We drained the fluid out of your knee today.   We did get some blood out of your knee.  He may have a small meniscal tear.  The injection we did today should help with this however let me know if this becomes a recurrent issue.  Please keep an eye on blood pressure on the note persistently elevated.  Please come back when you are due for your annual physical.  Take care, Dr Jerline Pain  PLEASE NOTE:  If you had any lab tests, please let us know if you have not heard back within a few days. You may see your results on mychart before we have a chance to review them but we will give you a call once they are reviewed by [place].   If we ordered any referrals today, please let us know if you have not heard from their office within the next week.   If you had any urgent prescriptions sent in today, please check with the pharmacy within an hour of our visit to make sure the prescription was transmitted appropriately.   Please try these tips to maintain a healthy lifestyle:  Eat at least 3 REAL meals and 1-2 snacks per day.  Aim for no more than 5 hours between eating.  If you eat breakfast, please do so within one hour of getting up.   Each meal should contain half fruits/vegetables, one quarter protein, and one quarter carbs (no bigger than a computer mouse)  Cut down on sweet beverages. This includes juice, soda, and sweet tea.   Drink at least 1 glass of water with each meal and aim for at least 8 glasses per day  Exercise at least 150 minutes every week.

## 2023-01-28 DIAGNOSIS — G4733 Obstructive sleep apnea (adult) (pediatric): Secondary | ICD-10-CM | POA: Diagnosis not present

## 2023-02-26 DIAGNOSIS — G4733 Obstructive sleep apnea (adult) (pediatric): Secondary | ICD-10-CM | POA: Diagnosis not present

## 2023-03-29 DIAGNOSIS — G4733 Obstructive sleep apnea (adult) (pediatric): Secondary | ICD-10-CM | POA: Diagnosis not present

## 2023-04-06 ENCOUNTER — Other Ambulatory Visit: Payer: Self-pay | Admitting: Family Medicine

## 2023-04-07 ENCOUNTER — Ambulatory Visit: Payer: Federal, State, Local not specified - PPO | Admitting: Adult Health

## 2023-04-07 ENCOUNTER — Encounter: Payer: Self-pay | Admitting: Adult Health

## 2023-04-07 VITALS — BP 150/110 | HR 70 | Temp 98.3°F | Ht 67.0 in | Wt 267.0 lb

## 2023-04-07 DIAGNOSIS — M25572 Pain in left ankle and joints of left foot: Secondary | ICD-10-CM | POA: Diagnosis not present

## 2023-04-07 DIAGNOSIS — Z8739 Personal history of other diseases of the musculoskeletal system and connective tissue: Secondary | ICD-10-CM

## 2023-04-07 DIAGNOSIS — I10 Essential (primary) hypertension: Secondary | ICD-10-CM | POA: Diagnosis not present

## 2023-04-07 LAB — URIC ACID: Uric Acid, Serum: 2.6 mg/dL — ABNORMAL LOW (ref 4.0–7.8)

## 2023-04-07 NOTE — Telephone Encounter (Signed)
Please advise 

## 2023-04-07 NOTE — Progress Notes (Signed)
Subjective:    Patient ID: Gerald Palmer, male    DOB: Dec 17, 1976, 47 y.o.   MRN: [id number]  Ankle Pain    47 year old male who  has a past medical history of Asthma and Hypertension.  He is a patient of Dr. [name] who I am seeing today for an acute issue. He reports left sided ankle pain x 2 weeks. He does have a history of gout and is taking his Allopurinol and colchicine.He reports that he was not taking his Allopurinol routinely until this pain started.   He has not noticed any redness or warmth.   Reports that over the last two weeks the pain has improved about 50% and states " I feel 100% better than I did yesterday. Now I can walk on it with only a little limp. Yesterday I could barely stand on it.   He denies trauma or aggravating factor. At home he has also been using Nsaids and ice.     Review of Systems See HPI   Past Medical History:  Diagnosis Date   Asthma    Hypertension     Social History   Socioeconomic History   Marital status: Single    Spouse name: Not on file   Number of children: Not on file   Years of education: Not on file   Highest education level: Not on file  Occupational History   Not on file  Tobacco Use   Smoking status: Former   Smokeless tobacco: Never  Vaping Use   Vaping Use: Never used  Substance and Sexual Activity   Alcohol use: Yes    Comment: occasionally   Drug use: No   Sexual activity: Yes  Other Topics Concern   Not on file  Social History Narrative   Not on file   Social Determinants of Health   Financial Resource Strain: Patient Declined (04/07/2023)   Overall Financial Resource Strain (CARDIA)    Difficulty of Paying Living Expenses: Patient declined  Food Insecurity: Patient Declined (04/07/2023)   Hunger Vital Sign    Worried About Running Out of Food in the Last Year: Patient declined    Ran Out of Food in the Last Year: Patient declined  Transportation Needs: Patient Declined (04/07/2023)   PRAPARE -  Administrator, Civil Service (Medical): Patient declined    Lack of Transportation (Non-Medical): Patient declined  Physical Activity: Insufficiently Active (04/07/2023)   Exercise Vital Sign    Days of Exercise per Week: 2 days    Minutes of Exercise per Session: 30 min  Stress: Stress Concern Present (04/07/2023)   [employer] of Occupational Health - Occupational Stress Questionnaire    Feeling of Stress : To some extent  Social Connections: Unknown (04/07/2023)   Social Connection and Isolation Panel [NHANES]    Frequency of Communication with Friends and Family: Twice a week    Frequency of Social Gatherings with Friends and Family: Once a week    Attends Religious Services: Patient declined    Database administrator or Organizations: No    Attends Engineer, structural: Not on file    Marital Status: Patient declined  Catering manager Violence: Not on file    Past Surgical History:  Procedure Laterality Date   ACHILLES TENDON SURGERY     ANKLE FRACTURE SURGERY Right    TONSILLECTOMY      Family History  Problem Relation Age of Onset   Hypertension Mother    Arthritis  Mother    Diabetes Mother    Hyperlipidemia Mother    Asthma Father    Hyperlipidemia Father    Hypertension Father    Stroke Father    Hypertension Brother    Hyperlipidemia Brother    Arthritis Maternal Grandmother     Allergies  Allergen Reactions   Aspirin     unknown   Ibuprofen Hives    High doses cause hives   Nsaids Hives   Shellfish Allergy Itching    Eyes swell    Current Outpatient Medications on File Prior to Visit  Medication Sig Dispense Refill   albuterol (PROVENTIL) (2.5 MG/3ML) 0.083% nebulizer solution Take 3 mLs (2.5 mg total) by nebulization every 6 (six) hours as needed for wheezing or shortness of breath. 75 mL 1   albuterol (VENTOLIN HFA) 108 (90 Base) MCG/ACT inhaler INHALE 2 PUFFS INTO THE LUNGS EVERY 4 TO 6 HOURS AS NEEDED FOR COUGH OR  WHEEZING 42.5 g 5   allopurinol (ZYLOPRIM) 300 MG tablet TAKE 1 AND 1/2 TABLETS(450 MG) BY MOUTH DAILY 135 tablet 3   atorvastatin (LIPITOR) 40 MG tablet TAKE 1 TABLET(40 MG) BY MOUTH DAILY 90 tablet 3   azelastine (ASTELIN) 0.1 % nasal spray Place 2 sprays into both nostrils 2 (two) times daily. 30 mL 12   benzonatate (TESSALON) 100 MG capsule Take 1 capsule (100 mg total) by mouth 3 (three) times daily as needed for cough. 30 capsule 0   budesonide-formoterol (SYMBICORT) 160-4.5 MCG/ACT inhaler Inhale 2 puffs into the lungs 2 (two) times daily. 30.6 g 1   cetirizine (ZYRTEC) 10 MG tablet Take 1 tablet (10 mg total) by mouth daily. 30 tablet 11   colchicine 0.6 MG tablet TAKE 1 TABLET(0.6 MG) BY MOUTH DAILY AS NEEDED FOR GOUT 30 tablet 5   diclofenac (VOLTAREN) 75 MG EC tablet Take 1 tablet (75 mg total) by mouth 2 (two) times daily. 30 tablet 0   diclofenac Sodium (VOLTAREN) 1 % GEL Apply 2 g topically 4 (four) times daily. 50 g 0   gabapentin (NEURONTIN) 100 MG capsule Take 1 capsule (100 mg total) by mouth 3 (three) times daily. 90 capsule 0   montelukast (SINGULAIR) 10 MG tablet Take one tablet once daily 90 tablet 3   nitroGLYCERIN (NITRODUR - DOSED IN MG/24 HR) 0.2 mg/hr patch Place 1 patch (0.2 mg total) onto the skin daily. Place 1/4 of the patch over the affected area. 30 patch 1   ondansetron (ZOFRAN) 8 MG tablet Take 1 tablet (8 mg total) by mouth every 8 (eight) hours as needed for nausea or vomiting. 20 tablet 0   No current facility-administered medications on file prior to visit.    BP (!) 150/110   Pulse 70   Temp 98.3 F (36.8 C) (Oral)   Ht  (1.702 m)   Wt 267 lb (121.1 kg)   SpO2 97%   BMI 41.82 kg/m       Objective:   Physical Exam Vitals and nursing note reviewed.  Constitutional:      Appearance: Normal appearance.  Pulmonary:     Breath sounds: Normal breath sounds.  Musculoskeletal:        General: Swelling present. No signs of injury.     Right  lower leg: No edema.     Left lower leg: No edema.     Left ankle: No swelling or deformity. Tenderness present. Normal range of motion.     Left Achilles Tendon: Normal.  Comments: Tenderness with palpation along talus.  Skin:    General: Skin is warm and dry.  Neurological:     General: No focal deficit present.     Mental Status: He is alert and oriented to person, place, and time.  Psychiatric:        Mood and Affect: Mood normal.        Behavior: Behavior normal.        Thought Content: Thought content normal.        Assessment & Plan:  1. Acute left ankle pain - Possible gout flare but I would not expect it to last two weeks. Cannot r/o calcaneal bursitis. Possible ligament or tendon strain.  Since he has had significant improvement in pain over the last 24 hours will hold off on xray.  - Advised continued conservative measurements.  - Also be helpful to apply ace bandage  - Follow up if not resolved in the next few days - Work note given   2. History of gout - Will check Uric Acid level today due to history  - Uric Acid; Future - Uric Acid  3. Essential hypertension - elevated in the office today - Reports that he was able to come off medications due to lifestyle changes - Encouraged to monitor at home and follow up with PCP if elevated   Shirline Frees, NP

## 2023-04-08 ENCOUNTER — Other Ambulatory Visit: Payer: Self-pay | Admitting: Adult Health

## 2023-04-08 ENCOUNTER — Ambulatory Visit: Payer: Federal, State, Local not specified - PPO | Admitting: Family Medicine

## 2023-04-08 NOTE — Telephone Encounter (Signed)
This needs to go the the provider that saw him for this. HE can come here if he is still having ongoing issues.  Katina Degree. [name], MD 04/08/2023 7:41 AM

## 2023-04-19 ENCOUNTER — Ambulatory Visit (INDEPENDENT_AMBULATORY_CARE_PROVIDER_SITE_OTHER): Payer: Federal, State, Local not specified - PPO | Admitting: Family Medicine

## 2023-04-19 ENCOUNTER — Encounter: Payer: Self-pay | Admitting: Family Medicine

## 2023-04-19 VITALS — BP 143/81 | HR 60 | Temp 97.1°F | Ht 67.0 in | Wt 266.6 lb

## 2023-04-19 DIAGNOSIS — M1000 Idiopathic gout, unspecified site: Secondary | ICD-10-CM | POA: Diagnosis not present

## 2023-04-19 DIAGNOSIS — Z1211 Encounter for screening for malignant neoplasm of colon: Secondary | ICD-10-CM | POA: Diagnosis not present

## 2023-04-19 DIAGNOSIS — I10 Essential (primary) hypertension: Secondary | ICD-10-CM

## 2023-04-19 MED ORDER — ALLOPURINOL 300 MG PO TABS: 300.0000 mg | ORAL_TABLET | Freq: Two times a day (BID) | ORAL | 3 refills | Status: DC

## 2023-04-19 MED ORDER — PREDNISONE 50 MG PO TABS: ORAL_TABLET | ORAL | 0 refills | Status: DC

## 2023-04-19 NOTE — Assessment & Plan Note (Signed)
Pain in the left ankle consistent with gout flare.  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  Discussed with patient that his uric acid level was likely low due to his current flare and precipitation of uric acid into the tophi to his Achilles tendon.  It is reassuring that symptoms have been improving for the last several days however it we will give him a course of prednisone which he has done well with in the past.  We did discuss checking an x-ray however we will see how he does with the prednisone first.  He will let me know if not improving in the next 2 days we can check an x-ray at that time versus referring him back to sports medicine.  For his ongoing gout management we did discuss having him see a rheumatologist.  He is currently prescribed allopurinol 450 mg daily.  His most recent uric acid level was likely falsely low due to his acute flare.  Given that he is developing tophi we discussed importance of reduction of uric acid and preventing flareups in the future.  Will increase his allopurinol to 600 mg daily.  He deferred referral to rheumatology today however if still has persistent flareups despite this dose of allopurinol he will need to be referred.  We can check his uric acid level again in 3 to 6 months.  We discussed reasons to return to care.  He agreed to this plan.

## 2023-04-19 NOTE — Patient Instructions (Addendum)
It was very nice to see you today!  I think you had a gout flare in your ankle.  Please start the prednisone.  Let us know if pain is not improving.  Please increase your allopurinol to 300 mg twice daily.  If you continue to have gout flares we will need to have you see the rheumatologist.  Return if symptoms worsen or fail to improve.   Take care, Dr [name]  PLEASE NOTE:  If you had any lab tests, please let us know if you have not heard back within a few days. You may see your results on mychart before we have a chance to review them but we will give you a call once they are reviewed by [place].   If we ordered any referrals today, please let us know if you have not heard from their office within the next week.   If you had any urgent prescriptions sent in today, please check with the pharmacy within an hour of our visit to make sure the prescription was transmitted appropriately.   Please try these tips to maintain a healthy lifestyle:  Eat at least 3 REAL meals and 1-2 snacks per day.  Aim for no more than 5 hours between eating.  If you eat breakfast, please do so within one hour of getting up.   Each meal should contain half fruits/vegetables, one quarter protein, and one quarter carbs (no bigger than a computer mouse)  Cut down on sweet beverages. This includes juice, soda, and sweet tea.   Drink at least 1 glass of water with each meal and aim for at least 8 glasses per day  Exercise at least 150 minutes every week.

## 2023-04-19 NOTE — Progress Notes (Signed)
   Gerald Palmer is a 47 y.o. male who presents today for an office visit.  Assessment/Plan:  Chronic Problems Addressed Today: Gout Pain in the left ankle consistent with gout flare.  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  Discussed with patient that his uric acid level was likely low due to his current flare and precipitation of uric acid into the tophi to his Achilles tendon.  It is reassuring that symptoms have been improving for the last several days however it we will give him a course of prednisone which he has done well with in the past.  We did discuss checking an x-ray however we will see how he does with the prednisone first.  He will let me know if not improving in the next 2 days we can check an x-ray at that time versus referring him back to sports medicine.  For his ongoing gout management we did discuss having him see a rheumatologist.  He is currently prescribed allopurinol 450 mg daily.  His most recent uric acid level was likely falsely low due to his acute flare.  Given that he is developing tophi we discussed importance of reduction of uric acid and preventing flareups in the future.  Will increase his allopurinol to 600 mg daily.  He deferred referral to rheumatology today however if still has persistent flareups despite this dose of allopurinol he will need to be referred.  We can check his uric acid level again in 3 to 6 months.  We discussed reasons to return to care.  He agreed to this plan.  Essential hypertension Mildly elevated today in setting of acute gout flare.  He has been typically well-controlled off meds.  He will continue to monitor at home and let us know if persistently elevated.     Subjective:  HPI:  See A/P for status of chronic conditions.  Patient is here with left ankle pain.  He was seen at a different office a couple of weeks ago. They diagnosed him with an ankle sprain.  Symptoms were improving for several days however  is still been persistent the last few days.  His pain started a month ago. No obvious precipitating events. He was cutting grass and then the next day had a lot of soreness. He then developed swelling a and lot of pain. Started using ice and aleve which helped.  It was very hard for him to walk for a few days.  Symptoms have gradually been improving.  He also has noticed a knot several weeks ago pop up on his left Achilles tendon several weeks ago.  He thought it was due to scar tissue.       Objective:  Physical Exam: BP (!) 143/81   Pulse 60   Temp (!) 97.1 F (36.2 C) (Temporal)   Ht 5\' 7"  (1.702 m)   Wt 266 lb 9.6 oz (120.9 kg)   SpO2 98%   BMI 41.76 kg/m   Gen: No acute distress, resting comfortably MSK: - Left foot: Approximately 1.5 cm tophaceous Neuro: Grossly normal, moves all extremities nodule on distal achilles tendon.  Thompson test normal.  Tenderness palpation along bilateral tibiotalar joint.  Mild effusion noted.  Minimal erythema.  Neurovascular intact distally. Psych: Normal affect and thought content      Previn Jian M. [name], MD 04/19/2023 2:22 PM

## 2023-04-19 NOTE — Assessment & Plan Note (Signed)
Mildly elevated today in setting of acute gout flare.  He has been typically well-controlled off meds.  He will continue to monitor at home and let us know if persistently elevated.

## 2023-05-12 DIAGNOSIS — G4733 Obstructive sleep apnea (adult) (pediatric): Secondary | ICD-10-CM | POA: Diagnosis not present

## 2023-05-14 LAB — COLOGUARD: COLOGUARD: NEGATIVE

## 2023-05-17 NOTE — Progress Notes (Signed)
Great news! Cologuard is negative. We can recheck again in 3 years.  Gerald Palmer. [name], MD 05/17/2023 8:48 AM

## 2023-06-09 ENCOUNTER — Other Ambulatory Visit: Payer: Self-pay

## 2023-06-09 ENCOUNTER — Ambulatory Visit (INDEPENDENT_AMBULATORY_CARE_PROVIDER_SITE_OTHER): Payer: Federal, State, Local not specified - PPO

## 2023-06-09 ENCOUNTER — Encounter: Payer: Self-pay | Admitting: Family Medicine

## 2023-06-09 ENCOUNTER — Ambulatory Visit: Payer: Federal, State, Local not specified - PPO | Admitting: Family Medicine

## 2023-06-09 VITALS — BP 164/98 | HR 86 | Ht 67.0 in | Wt 165.6 lb

## 2023-06-09 DIAGNOSIS — M1000 Idiopathic gout, unspecified site: Secondary | ICD-10-CM | POA: Diagnosis not present

## 2023-06-09 DIAGNOSIS — M25561 Pain in right knee: Secondary | ICD-10-CM

## 2023-06-09 LAB — URIC ACID: Uric Acid, Serum: 9.5 mg/dL — ABNORMAL HIGH (ref 4.0–7.8)

## 2023-06-09 NOTE — Patient Instructions (Addendum)
Thank you for coming in today.   You received an injection today. Seek immediate medical attention if the joint becomes red, extremely painful, or is oozing fluid.   Please get an Xray today before you leave   Please get labs today before you leave   Let me know how it goes

## 2023-06-09 NOTE — Progress Notes (Signed)
I, Stevenson Clinch, CMA acting as a scribe for Clementeen Graham, MD.  Gerald Palmer is a 47 y.o. male who presents to [facility] today for knee pain. Pt was previously seen by Dr. Denyse Amass on 04/19/22 for L Achilles pain. Pt has a hx of gout, currently managed by PCP, who prescribed a course of prednisone at his last visit on April 30th.   Today, pt c/o right knee pain x 2-3 months, no known injury. Pt locates pain to lateral aspect of patella. Feels like it is catching. Swelling present. Denies radiating pain. Feels like the knee is shifting while walking and ascending stairs. Notes having knee problems off and on over the years. Notes over compensating while having pain in the left achilles. Notes improvement of achilles sx.   Knee swelling: yes Mechanical symptoms: yes Aggravates: ascending stairs Treatments tried: ice, compression, Tylenol, Aleve  Dx testing: 04/07/23 Uric acid 12/20/21 R tib/fib XR 08/31/21 Labs (uric acid = 9.0)             03/16/21 Labs (uric acid = 7.4) 01/23/18 R knee MRI  01/11/18 R knee XR  Pertinent review of systems: No fevers or chills  Relevant historical information: History of gout   Exam:  BP (!) 164/98   Pulse 86   Ht 5\' 7"  (1.702 m)   Wt 165 lb 9.6 oz (75.1 kg)   SpO2 96%   BMI 25.94 kg/m  General: Well Developed, well nourished, and in no acute distress.   MSK: Right knee large effusion.  Otherwise normal appearing. Reduced range of motion in flexion and extension. Tender palpation medial joint line. Stable ligamentous exam.    Lab and Radiology Results  Procedure: Real-time Ultrasound Guided Injection of the right knee joint superior lateral patellar space Device: Philips Affiniti 50G Images permanently stored and available for review in PACS Verbal informed consent obtained.  Discussed risks and benefits of procedure. Warned about infection, bleeding, hyperglycemia damage to structures among others. Patient  expresses understanding and agreement Time-out conducted.   Noted no overlying erythema, induration, or other signs of local infection.   Skin prepped in a sterile fashion.   Local anesthesia: Topical Ethyl chloride.   With sterile technique and under real time ultrasound guidance: 40 mg Kenalog and 2 mL of Marcaine injected into knee joint. Fluid seen entering the joint capsule.   Completed without difficulty   Pain immediately resolved suggesting accurate placement of the medication.   Advised to call if fevers/chills, erythema, induration, drainage, or persistent bleeding.   Images permanently stored and available for review in the ultrasound unit.  Impression: Technically successful ultrasound guided injection.   X-ray images right knee obtained today personally and independently interpreted Mild DJD.  No acute fractures are visible. Await formal radiology review      Assessment and Plan: 47 y.o. male with right knee pain thought to be exacerbation of gout or DJD.  Plan for steroid injection today.  Will go ahead and check uric acid.  It was last checked during a flare and artificially low.  His current dose of allopurinol is 600 mg daily.  If uric acid is significantly elevated we could increase allopurinol again up to a maximum of 800 mg a day.  If that is not sufficient we could add probenecid.   PDMP not reviewed this encounter. Orders Placed This Encounter  Procedures   [place] LIMITED JOINT SPACE STRUCTURES LOW RIGHT(NO LINKED CHARGES)    Order Specific  Question:   Reason for Exam (SYMPTOM  OR DIAGNOSIS REQUIRED)    Answer:   right knee pain    Order Specific Question:   Preferred imaging location?    Answer:   [facility] Knee AP/LAT W/Sunrise Right    Standing Status:   Future    Number of Occurrences:   1    Standing Expiration Date:   07/09/2023    Order Specific Question:   Reason for Exam (SYMPTOM  OR DIAGNOSIS REQUIRED)    Answer:    right knee pain    Order Specific Question:   Preferred imaging location?    Answer:   [facility]   Uric acid    Standing Status:   Future    Number of Occurrences:   1    Standing Expiration Date:   06/08/2024   No orders of the defined types were placed in this encounter.    Discussed warning signs or symptoms. Please see discharge instructions. Patient expresses understanding.   The above documentation has been reviewed and is accurate and complete Clementeen Graham, M.D.

## 2023-06-10 MED ORDER — ALLOPURINOL 200 MG PO TABS: 800.0000 mg | ORAL_TABLET | Freq: Every day | ORAL | 2 refills | Status: DC

## 2023-06-10 NOTE — Progress Notes (Signed)
Uric acid is very elevated at 9.5.  I am going to increase allopurinol up to 800 mg a day.  We should recheck these labs in about a month or 2.  Recommend recheck with me in 6 weeks or so.  The maximum dose of allopurinol is 800 mg.  You currently have 300 mg pills.  I am writing a new prescription for 200 mg pills.  You will take 4 of them total per day.  You can take them all at once or divided twice daily.

## 2023-06-10 NOTE — Addendum Note (Signed)
Addended by: Rodolph Bong on: 06/10/2023 06:36 AM   Modules accepted: Orders

## 2023-06-12 DIAGNOSIS — G4733 Obstructive sleep apnea (adult) (pediatric): Secondary | ICD-10-CM | POA: Diagnosis not present

## 2023-06-13 NOTE — Progress Notes (Signed)
Right knee x-ray looks normal to radiology

## 2023-06-14 ENCOUNTER — Telehealth: Payer: Self-pay

## 2023-06-14 NOTE — Telephone Encounter (Signed)
Allopurinol 200 mg tab  Covermymeds[email or web address]  KEY [id number]

## 2023-06-14 NOTE — Telephone Encounter (Signed)
Dr. Denyse Amass,  Allopurinol, specifically the 200 mg tablet, is not covered by pt's insurance.   Please see below for covered alternatives:  allopurinol (does not include  200mg  tab),  colchicine,  febuxostat,  probenecid,  COLCRYS,  KRYSTEXXA,  MITIGARE,  ULORIC,  ZYLOPRIM

## 2023-06-15 MED ORDER — ALLOPURINOL 300 MG PO TABS: 600.0000 mg | ORAL_TABLET | Freq: Every day | ORAL | 3 refills | Status: AC

## 2023-06-15 MED ORDER — ALLOPURINOL 100 MG PO TABS: 200.0000 mg | ORAL_TABLET | Freq: Every day | ORAL | 2 refills | Status: AC

## 2023-06-15 NOTE — Telephone Encounter (Signed)
Called pt and advised per Dr. Corey. Pt verbalized understanding.  

## 2023-06-15 NOTE — Addendum Note (Signed)
Addended by: Rodolph Bong on: 06/15/2023 05:48 AM   Modules accepted: Orders

## 2023-06-15 NOTE — Telephone Encounter (Signed)
I had to switch the allopurinol prescription due to insurance reasons.  Total dose is still 800 mg/day but were can have to use different size pills.

## 2023-07-12 DIAGNOSIS — G4733 Obstructive sleep apnea (adult) (pediatric): Secondary | ICD-10-CM | POA: Diagnosis not present

## 2023-07-20 ENCOUNTER — Encounter (HOSPITAL_BASED_OUTPATIENT_CLINIC_OR_DEPARTMENT_OTHER): Payer: Self-pay | Admitting: Emergency Medicine

## 2023-07-20 ENCOUNTER — Emergency Department (HOSPITAL_BASED_OUTPATIENT_CLINIC_OR_DEPARTMENT_OTHER)
Admission: EM | Admit: 2023-07-20 | Discharge: 2023-07-20 | Disposition: A | Discharge status: Home / Self Care | Attending: Emergency Medicine | Admitting: Emergency Medicine

## 2023-07-20 ENCOUNTER — Encounter (INDEPENDENT_AMBULATORY_CARE_PROVIDER_SITE_OTHER): Payer: Self-pay

## 2023-07-20 ENCOUNTER — Other Ambulatory Visit: Payer: Self-pay

## 2023-07-20 ENCOUNTER — Ambulatory Visit: Payer: Federal, State, Local not specified - PPO | Admitting: Family Medicine

## 2023-07-20 ENCOUNTER — Emergency Department (HOSPITAL_BASED_OUTPATIENT_CLINIC_OR_DEPARTMENT_OTHER): Payer: Federal, State, Local not specified - PPO

## 2023-07-20 DIAGNOSIS — M25511 Pain in right shoulder: Secondary | ICD-10-CM | POA: Insufficient documentation

## 2023-07-20 DIAGNOSIS — Y990 Civilian activity done for income or pay: Secondary | ICD-10-CM | POA: Diagnosis not present

## 2023-07-20 DIAGNOSIS — S4991XA Unspecified injury of right shoulder and upper arm, initial encounter: Secondary | ICD-10-CM

## 2023-07-20 MED ORDER — CELECOXIB 200 MG PO CAPS: 200.0000 mg | ORAL_CAPSULE | Freq: Two times a day (BID) | ORAL | 0 refills | Status: DC

## 2023-07-20 NOTE — ED Triage Notes (Signed)
Hurt rt shoulder last Friday  works at the [employer] , sharp pain he states hurts to move it and when it is still

## 2023-07-20 NOTE — ED Provider Notes (Signed)
[facility] Provider Note   CSN: [id number] Arrival date & time: 07/20/23  1148     History  Chief Complaint  Patient presents with   Shoulder Pain    Gerald Palmer is a 47 y.o. male who presents with the chief complaint of right shoulder pain.  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  He is right-hand dominant.  He now has persistent pain whenever he tries to abduct or externally rotate the arm.  He has no previous history of shoulder injury or pain.  At rest he has no significant pain at rest.   Shoulder Pain      Home Medications Prior to Admission medications   Medication Sig Start Date End Date Taking? Authorizing Provider  albuterol (PROVENTIL) (2.5 MG/3ML) 0.083% nebulizer solution Take 3 mLs (2.5 mg total) by nebulization every 6 (six) hours as needed for wheezing or shortness of breath. 02/04/22   Ardith Dark, MD  albuterol (VENTOLIN HFA) 108 (90 Base) MCG/ACT inhaler INHALE 2 PUFFS INTO THE LUNGS EVERY 4 TO 6 HOURS AS NEEDED FOR COUGH OR WHEEZING 12/21/22   Ardith Dark, MD  allopurinol (ZYLOPRIM) 100 MG tablet Take 2 tablets (200 mg total) by mouth daily. Total dose 800 milligrams per day 06/15/23   Rodolph Bong, MD  allopurinol (ZYLOPRIM) 300 MG tablet Take 2 tablets (600 mg total) by mouth daily. Total dose 800 mg/day 06/15/23   Rodolph Bong, MD  atorvastatin (LIPITOR) 40 MG tablet TAKE 1 TABLET(40 MG) BY MOUTH DAILY 08/31/21   Ardith Dark, MD  azelastine (ASTELIN) 0.1 % nasal spray Place 2 sprays into both nostrils 2 (two) times daily. 02/06/20   Ardith Dark, MD  budesonide-formoterol [facility]) 160-4.5 MCG/ACT inhaler Inhale 2 puffs into the lungs 2 (two) times daily. 01/12/23   Ardith Dark, MD  cetirizine (ZYRTEC) 10 MG tablet Take 1 tablet (10 mg total) by mouth daily. 03/15/19   Ardith Dark, MD   colchicine 0.6 MG tablet TAKE 1 TABLET(0.6 MG) BY MOUTH DAILY AS NEEDED FOR GOUT 04/07/23   Ardith Dark, MD  diclofenac Sodium (VOLTAREN) 1 % GEL Apply 2 g topically 4 (four) times daily. 10/27/21   Tegeler, Canary Brim, MD  gabapentin (NEURONTIN) 100 MG capsule Take 1 capsule (100 mg total) by mouth 3 (three) times daily. 08/31/21   Ardith Dark, MD  montelukast (SINGULAIR) 10 MG tablet Take one tablet once daily 08/31/21   Ardith Dark, MD  predniSONE (DELTASONE) 50 MG tablet Take 1 tablet daily for 5 days. 04/19/23   Ardith Dark, MD      Allergies    Aspirin, Ibuprofen, Nsaids, and Shellfish allergy    Review of Systems   Review of Systems  Physical Exam Updated Vital Signs BP (!) 139/98 (BP Location: Left Arm)   Pulse 78   Temp 98.1 F (36.7 C) (Oral)   Resp 17   Ht 5\' 7"  (1.702 m)   Wt 115.7 kg   SpO2 96%   BMI 39.94 kg/m  Physical Exam Vitals and nursing note reviewed.  Constitutional:      General: He is not in acute distress.    Appearance: He is well-developed. He is not diaphoretic.  HENT:     Head: Normocephalic and atraumatic.  Eyes:     General:  No scleral icterus.    Conjunctiva/sclera: Conjunctivae normal.  Cardiovascular:     Rate and Rhythm: Normal rate and regular rhythm.     Heart sounds: Normal heart sounds.  Pulmonary:     Effort: Pulmonary effort is normal. No respiratory distress.     Breath sounds: Normal breath sounds.  Abdominal:     Palpations: Abdomen is soft.     Tenderness: There is no abdominal tenderness.  Musculoskeletal:     Cervical back: Normal range of motion and neck supple.     Comments: Decreased range of motion on the right.  Pain with passive range of motion with external rotation, and abduction.  Tenderness to palpation of the anterior and posterior shoulder region.  Skin:    General: Skin is warm and dry.  Neurological:     Mental Status: He is alert.  Psychiatric:        Behavior: Behavior normal.     ED  Results / Procedures / Treatments   Labs (all labs ordered are listed, but only abnormal results are displayed) Labs Reviewed - No data to display  EKG None  Radiology DG Shoulder Right  Result Date: 07/20/2023 CLINICAL DATA:  Right shoulder pain after injury last week. EXAM: RIGHT SHOULDER - 2+ VIEW COMPARISON:  None Available. FINDINGS: There is no evidence of fracture or dislocation. There is no evidence of arthropathy or other focal bone abnormality. Soft tissues are unremarkable. IMPRESSION: Negative. Electronically Signed   By: Lupita Raider M.D.   On: 07/20/2023 13:14    Procedures Procedures    Medications Ordered in ED Medications - No data to display  ED Course/ Medical Decision Making/ A&P                                 Medical Decision Making Amount and/or Complexity of Data Reviewed Radiology: ordered and independent interpretation performed.    Details: I visualized and interpreted a right shoulder x-ray which shows no acute findings.   Patient here with right shoulder injury.  Differential includes strain versus labral tear versus rotator cuff injury.  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  Advised RICE protocol, work note written for no lifting with the right arm until cleared by orthopedic surgery.        Final Clinical Impression(s) / ED Diagnoses Final diagnoses:  None    Rx / DC Orders ED Discharge Orders     None         Arthor Captain, PA-C 07/20/23 1340    Alvira Monday, MD 07/22/23 2138

## 2023-07-20 NOTE — Discharge Instructions (Addendum)
Contact a health care provider if: Your pain is not relieved with medicines. New pain develops in your arm, hand, or fingers. You loosen your sling and your arm, hand, or fingers remain tingly, numb, swollen, or painful.

## 2023-08-11 ENCOUNTER — Ambulatory Visit (INDEPENDENT_AMBULATORY_CARE_PROVIDER_SITE_OTHER): Payer: Federal, State, Local not specified - PPO | Admitting: Family Medicine

## 2023-08-11 ENCOUNTER — Encounter: Payer: Self-pay | Admitting: Family Medicine

## 2023-08-11 VITALS — BP 144/87 | HR 70 | Temp 97.5°F | Ht 67.0 in | Wt 258.6 lb

## 2023-08-11 DIAGNOSIS — J45901 Unspecified asthma with (acute) exacerbation: Secondary | ICD-10-CM

## 2023-08-11 DIAGNOSIS — M1000 Idiopathic gout, unspecified site: Secondary | ICD-10-CM | POA: Diagnosis not present

## 2023-08-11 DIAGNOSIS — R739 Hyperglycemia, unspecified: Secondary | ICD-10-CM | POA: Diagnosis not present

## 2023-08-11 DIAGNOSIS — E785 Hyperlipidemia, unspecified: Secondary | ICD-10-CM | POA: Diagnosis not present

## 2023-08-11 DIAGNOSIS — G5710 Meralgia paresthetica, unspecified lower limb: Secondary | ICD-10-CM

## 2023-08-11 DIAGNOSIS — Z0001 Encounter for general adult medical examination with abnormal findings: Secondary | ICD-10-CM | POA: Diagnosis not present

## 2023-08-11 DIAGNOSIS — J4540 Moderate persistent asthma, uncomplicated: Secondary | ICD-10-CM

## 2023-08-11 DIAGNOSIS — I10 Essential (primary) hypertension: Secondary | ICD-10-CM

## 2023-08-11 LAB — COMPREHENSIVE METABOLIC PANEL
ALT: 27 U/L (ref 0–53)
AST: 20 U/L (ref 0–37)
Albumin: 4.2 g/dL (ref 3.5–5.2)
Alkaline Phosphatase: 82 U/L (ref 39–117)
BUN: 11 mg/dL (ref 6–23)
CO2: 29 mEq/L (ref 19–32)
Calcium: 8.9 mg/dL (ref 8.4–10.5)
Chloride: 104 meq/L (ref 96–112)
Creatinine, Ser: 1.26 mg/dL (ref 0.40–1.50)
GFR: 68.93 mL/min (ref >60.00)
Glucose, Bld: 83 mg/dL (ref 70–99)
Potassium: 3.5 meq/L (ref 3.5–5.1)
Sodium: 140 mEq/L (ref 135–145)
Total Bilirubin: 0.4 mg/dL (ref 0.2–1.2)
Total Protein: 6.9 g/dL (ref 6.0–8.3)

## 2023-08-11 LAB — LIPID PANEL
Cholesterol: 194 mg/dL (ref 0–200)
HDL: 29.8 mg/dL — ABNORMAL LOW (ref >39.00)
NonHDL: 164.23
Total CHOL/HDL Ratio: 7
Triglycerides: 365 mg/dL — ABNORMAL HIGH (ref 0.0–149.0)
VLDL: 73 mg/dL — ABNORMAL HIGH (ref 0.0–40.0)

## 2023-08-11 LAB — LDL CHOLESTEROL, DIRECT: Direct LDL: 135 mg/dL

## 2023-08-11 LAB — CBC
HCT: 41.9 % (ref 39.0–52.0)
Hemoglobin: 13.5 g/dL (ref 13.0–17.0)
MCHC: 32.1 g/dL (ref 30.0–36.0)
MCV: 85.4 fl (ref 78.0–100.0)
Platelets: 354 10*3/uL (ref 150.0–400.0)
RBC: 4.91 Mil/uL (ref 4.22–5.81)
RDW: 14.9 % (ref 11.5–15.5)
WBC: 9.9 10*3/uL (ref 4.0–10.5)

## 2023-08-11 LAB — HEMOGLOBIN A1C: Hgb A1c MFr Bld: 6.3 % (ref 4.6–6.5)

## 2023-08-11 LAB — URIC ACID: Uric Acid, Serum: 9 mg/dL — ABNORMAL HIGH (ref 4.0–7.8)

## 2023-08-11 MED ORDER — BUDESONIDE-FORMOTEROL FUMARATE 160-4.5 MCG/ACT IN AERO
2.0000 | INHALATION_SPRAY | Freq: Two times a day (BID) | RESPIRATORY_TRACT | 1 refills | Status: DC
Start: 2023-08-11 — End: 2024-03-08

## 2023-08-11 MED ORDER — ATORVASTATIN CALCIUM 40 MG PO TABS: ORAL_TABLET | ORAL | 3 refills | Status: AC

## 2023-08-11 MED ORDER — ALBUTEROL SULFATE (2.5 MG/3ML) 0.083% IN NEBU: 2.5000 mg | INHALATION_SOLUTION | Freq: Four times a day (QID) | RESPIRATORY_TRACT | 1 refills | Status: DC | PRN

## 2023-08-11 NOTE — Assessment & Plan Note (Signed)
Check lipids.  He been out of atorvastatin.  Will refill atorvastatin 40 mg daily.

## 2023-08-11 NOTE — Progress Notes (Signed)
Chief Complaint:  Gerald Palmer is a 47 y.o. male who presents today for his annual comprehensive physical exam.    Assessment/Plan:  Chronic Problems Addressed Today: Hyperglycemia Check A1c.  If in diabetic range will likely start GLP-1 agonist.  Briefly discussed this with patient today and he may be open to this though we will discuss further once his labs come back.  We discussed lifestyle modifications.  Dyslipidemia Check lipids.  He been out of atorvastatin.  Will refill atorvastatin 40 mg daily.  Gout Doing okay recently.  No recent flares.  Check uric acid level.  Continue allopurinol 800mg  daily per sports medicine.  Essential hypertension Very mildly elevated today.  He will let us know if persistently elevated at home.  If elevated at next office visit will need to restart losartan 50 mg daily.  Meralgia paresthetica Did not have much pain with gabapentin.  Pain is still persistent.  Recommended he follow-up with sports medicine to discuss next steps in management.  Given the lack of improvement with gabapentin will not retrial at this point.  Asthma Stable on Symbicort and albuterol.  Will refill today.  Preventative Healthcare: Check labs.  Up-to-date on colon cancer screening.  Patient Counseling(The following topics were reviewed and/or handout was given):  -Nutrition: Stressed importance of moderation in sodium/caffeine intake, saturated fat and cholesterol, caloric balance, sufficient intake of fresh fruits, vegetables, and fiber.  -Stressed the importance of regular exercise.   -Substance Abuse: Discussed cessation/primary prevention of tobacco, alcohol, or other drug use; driving or other dangerous activities under the influence; availability of treatment for abuse.   -Injury prevention: Discussed safety belts, safety helmets, smoke detector, smoking near bedding or upholstery.   -Sexuality: Discussed sexually transmitted diseases, partner selection, use of  condoms, avoidance of unintended pregnancy and contraceptive alternatives.   -Dental health: Discussed importance of regular tooth brushing, flossing, and dental visits.  -Health maintenance and immunizations reviewed. Please refer to Health maintenance section.  Return to care in 1 year for next preventative visit.     Subjective:  HPI:  He has no acute complaints today.   He did injure his shoulder at work several weeks ago.  He was initially seen at the ED.  Has been referred to orthopedics by his Worker's Comp. group though is waiting to see them.  Lifestyle Diet: Balanced. Plenty of fruits and vegetables.  Exercise: Limited      08/11/2023    1:52 PM  Depression screen PHQ 2/9  Decreased Interest 0  Down, Depressed, Hopeless 0  PHQ - 2 Score 0  Altered sleeping 0  Tired, decreased energy 0  Change in appetite 0  Feeling bad or failure about yourself  1  Trouble concentrating 0  Moving slowly or fidgety/restless 0  Suicidal thoughts 0  PHQ-9 Score 1  Difficult doing work/chores Not difficult at all    There are no preventive care reminders to display for this patient.    ROS: Per HPI, otherwise a complete review of systems was negative.   PMH:  The following were reviewed and entered/updated in epic: Past Medical History:  Diagnosis Date   Asthma    Hypertension    Patient Active Problem List   Diagnosis Date Noted   Essential hypertension 08/04/2021   Ulnar neuropathy of both upper extremities 08/04/2021   Meralgia paresthetica 10/05/2019   Carpal tunnel syndrome, bilateral 08/14/2019   Severe obstructive sleep apnea-hypopnea syndrome 05/15/2019   Dyslipidemia 08/08/2018   Hyperglycemia 08/08/2018  Gout 03/21/2018   Knee pain 12/30/2017   Allergic rhinitis 11/14/2017   Asthma 10/11/2017   Past Surgical History:  Procedure Laterality Date   ACHILLES TENDON SURGERY     ANKLE FRACTURE SURGERY Right    TONSILLECTOMY      Family History  Problem  Relation Age of Onset   Hypertension Mother    Arthritis Mother    Diabetes Mother    Hyperlipidemia Mother    Asthma Father    Hyperlipidemia Father    Hypertension Father    Stroke Father    Hypertension Brother    Hyperlipidemia Brother    Arthritis Maternal Grandmother     Medications- reviewed and updated Current Outpatient Medications  Medication Sig Dispense Refill   albuterol (VENTOLIN HFA) 108 (90 Base) MCG/ACT inhaler INHALE 2 PUFFS INTO THE LUNGS EVERY 4 TO 6 HOURS AS NEEDED FOR COUGH OR WHEEZING 42.5 g 5   allopurinol (ZYLOPRIM) 100 MG tablet Take 2 tablets (200 mg total) by mouth daily. Total dose 800 milligrams per day 90 tablet 2   allopurinol (ZYLOPRIM) 300 MG tablet Take 2 tablets (600 mg total) by mouth daily. Total dose 800 mg/day 180 tablet 3   cetirizine (ZYRTEC) 10 MG tablet Take 1 tablet (10 mg total) by mouth daily. 30 tablet 11   colchicine 0.6 MG tablet TAKE 1 TABLET(0.6 MG) BY MOUTH DAILY AS NEEDED FOR GOUT 30 tablet 5   diclofenac Sodium (VOLTAREN) 1 % GEL Apply 2 g topically 4 (four) times daily. 50 g 0   montelukast (SINGULAIR) 10 MG tablet Take one tablet once daily 90 tablet 3   albuterol (PROVENTIL) (2.5 MG/3ML) 0.083% nebulizer solution Take 3 mLs (2.5 mg total) by nebulization every 6 (six) hours as needed for wheezing or shortness of breath. 75 mL 1   atorvastatin (LIPITOR) 40 MG tablet TAKE 1 TABLET(40 MG) BY MOUTH DAILY 90 tablet 3   budesonide-formoterol (SYMBICORT) 160-4.5 MCG/ACT inhaler Inhale 2 puffs into the lungs 2 (two) times daily. 30.6 g 1   No current facility-administered medications for this visit.    Allergies-reviewed and updated Allergies  Allergen Reactions   Aspirin     unknown   Ibuprofen Hives    High doses cause hives   Nsaids Hives   Shellfish Allergy Itching    Eyes swell    Social History   Socioeconomic History   Marital status: Single    Spouse name: Not on file   Number of children: Not on file   Years  of education: Not on file   Highest education level: Not on file  Occupational History   Not on file  Tobacco Use   Smoking status: Former   Smokeless tobacco: Never  Vaping Use   Vaping status: Never Used  Substance and Sexual Activity   Alcohol use: Yes    Comment: occasionally   Drug use: No   Sexual activity: Yes  Other Topics Concern   Not on file  Social History Narrative   Not on file   Social Determinants of Health   Financial Resource Strain: Patient Declined (04/07/2023)   Overall Financial Resource Strain (CARDIA)    Difficulty of Paying Living Expenses: Patient declined  Food Insecurity: Patient Declined (04/07/2023)   Hunger Vital Sign    Worried About Running Out of Food in the Last Year: Patient declined    Ran Out of Food in the Last Year: Patient declined  Transportation Needs: Patient Declined (04/07/2023)   PRAPARE - Transportation  Lack of Transportation (Medical): Patient declined    Lack of Transportation (Non-Medical): Patient declined  Physical Activity: Insufficiently Active (04/07/2023)   Exercise Vital Sign    Days of Exercise per Week: 2 days    Minutes of Exercise per Session: 30 min  Stress: Stress Concern Present (04/07/2023)   [employer] of Occupational Health - Occupational Stress Questionnaire    Feeling of Stress : To some extent  Social Connections: Unknown (04/07/2023)   Social Connection and Isolation Panel [NHANES]    Frequency of Communication with Friends and Family: Twice a week    Frequency of Social Gatherings with Friends and Family: Once a week    Attends Religious Services: Patient declined    Database administrator or Organizations: No    Attends Engineer, structural: Not on file    Marital Status: Patient declined        Objective:  Physical Exam: BP (!) 144/87   Pulse 70   Temp (!) 97.5 F (36.4 C) (Temporal)   Ht 5\' 7"  (1.702 m)   Wt 258 lb 9.6 oz (117.3 kg)   SpO2 97%   BMI 40.50 kg/m   Body  mass index is 40.5 kg/m. Wt Readings from Last 3 Encounters:  08/11/23 258 lb 9.6 oz (117.3 kg)  07/20/23 255 lb (115.7 kg)  06/09/23 165 lb 9.6 oz (75.1 kg)   Gen: NAD, resting comfortably HEENT: TMs normal bilaterally. OP clear. No thyromegaly noted.  CV: RRR with no murmurs appreciated Pulm: NWOB, CTAB with no crackles, wheezes, or rhonchi GI: Normal bowel sounds present. Soft, Nontender, Nondistended. MSK: no edema, cyanosis, or clubbing noted Skin: warm, dry Neuro: CN2-12 grossly intact. Strength 5/5 in upper and lower extremities. Reflexes symmetric and intact bilaterally.  Psych: Normal affect and thought content     Glinda Natzke M. [name], MD 08/11/2023 2:33 PM

## 2023-08-11 NOTE — Assessment & Plan Note (Signed)
Did not have much pain with gabapentin.  Pain is still persistent.  Recommended he follow-up with sports medicine to discuss next steps in management.  Given the lack of improvement with gabapentin will not retrial at this point.

## 2023-08-11 NOTE — Patient Instructions (Addendum)
It was very nice to see you today!  Please keep an eye on your blood pressure and let us know if it is persistently elevated.  We will check blood work today.  Depending on results of your sugar we may need to start a medication to help lower this and also help with weight loss.  We will contact you once we get the results back.  Please call Dr. Zollie Pee office for your leg pain.  We will see you back in year for your next physical.  Come back sooner if needed.  Return in about 1 year (around 08/10/2024) for Annual Physical.   Take care, Dr [name]  PLEASE NOTE:  If you had any lab tests, please let us know if you have not heard back within a few days. You may see your results on mychart before we have a chance to review them but we will give you a call once they are reviewed by [place].   If we ordered any referrals today, please let us know if you have not heard from their office within the next week.   If you had any urgent prescriptions sent in today, please check with the pharmacy within an hour of our visit to make sure the prescription was transmitted appropriately.   Please try these tips to maintain a healthy lifestyle:  Eat at least 3 REAL meals and 1-2 snacks per day.  Aim for no more than 5 hours between eating.  If you eat breakfast, please do so within one hour of getting up.   Each meal should contain half fruits/vegetables, one quarter protein, and one quarter carbs (no bigger than a computer mouse)  Cut down on sweet beverages. This includes juice, soda, and sweet tea.   Drink at least 1 glass of water with each meal and aim for at least 8 glasses per day  Exercise at least 150 minutes every week.

## 2023-08-11 NOTE — Assessment & Plan Note (Signed)
Very mildly elevated today.  He will let us know if persistently elevated at home.  If elevated at next office visit will need to restart losartan 50 mg daily.

## 2023-08-11 NOTE — Assessment & Plan Note (Signed)
Check A1c.  If in diabetic range will likely start GLP-1 agonist.  Briefly discussed this with patient today and he may be open to this though we will discuss further once his labs come back.  We discussed lifestyle modifications.

## 2023-08-11 NOTE — Assessment & Plan Note (Signed)
Stable on Symbicort and albuterol.  Will refill today.

## 2023-08-11 NOTE — Assessment & Plan Note (Signed)
Doing okay recently.  No recent flares.  Check uric acid level.  Continue allopurinol 800mg  daily per sports medicine.

## 2023-08-12 LAB — TSH: TSH: 2.05 u[IU]/mL (ref 0.35–5.50)

## 2023-08-16 NOTE — Progress Notes (Signed)
His cholesterol is up since last time.  Please verify that he has been taking his Lipitor 40 mg daily.If he has been consistent with this then we need to increase the dose.  Please send in 80 mg daily if needed.  We should recheck this again in 3 to 6 months.  His blood sugar is borderline diabetic but slightly better than last time we checked.  Do not need to start any meds for this at this point however he should continue to work on diet and exercise and we can recheck in a year or so.  His uric acid (gout levels) are above goal.  He should discuss with sports medicine if they need to make any changes to his allopurinol.  The rest of his labs are all stable and we can recheck in a year or so.

## 2023-08-26 ENCOUNTER — Telehealth: Payer: Self-pay | Admitting: Family Medicine

## 2023-08-26 NOTE — Telephone Encounter (Signed)
Patient states he is in need of a new CPAP mask (just the mask). States that the place he got it from, [facility] said they could send him a new one if they have a referral from PCP. Referral/order can be faxed to the number below:   [facility]  [phone]

## 2023-08-26 NOTE — Telephone Encounter (Signed)
Please advise 

## 2023-08-26 NOTE — Telephone Encounter (Signed)
Patient aware, will let us know if referral needed

## 2023-08-26 NOTE — Telephone Encounter (Signed)
This should go to his sleep medicine doctor.  Katina Degree. [name], MD 08/26/2023 1:02 PM

## 2023-09-07 DIAGNOSIS — G4733 Obstructive sleep apnea (adult) (pediatric): Secondary | ICD-10-CM | POA: Diagnosis not present

## 2023-10-07 DIAGNOSIS — G4733 Obstructive sleep apnea (adult) (pediatric): Secondary | ICD-10-CM | POA: Diagnosis not present

## 2023-11-01 ENCOUNTER — Encounter: Payer: Self-pay | Admitting: Family Medicine

## 2023-11-03 ENCOUNTER — Telehealth: Payer: Federal, State, Local not specified - PPO | Admitting: Physician Assistant

## 2023-11-03 DIAGNOSIS — J4541 Moderate persistent asthma with (acute) exacerbation: Secondary | ICD-10-CM

## 2023-11-03 DIAGNOSIS — B9689 Other specified bacterial agents as the cause of diseases classified elsewhere: Secondary | ICD-10-CM

## 2023-11-03 DIAGNOSIS — J019 Acute sinusitis, unspecified: Secondary | ICD-10-CM

## 2023-11-03 MED ORDER — PREDNISONE 20 MG PO TABS
40.0000 mg | ORAL_TABLET | Freq: Every day | ORAL | 0 refills | Status: DC
Start: 2023-11-03 — End: 2024-02-07

## 2023-11-03 MED ORDER — DOXYCYCLINE HYCLATE 100 MG PO TABS
100.0000 mg | ORAL_TABLET | Freq: Two times a day (BID) | ORAL | 0 refills | Status: DC
Start: 2023-11-03 — End: 2024-02-27

## 2023-11-03 NOTE — Patient Instructions (Signed)
Gerald Palmer, thank you for joining Piedad Climes, PA-C for today's virtual visit.  While this provider is not your primary care provider (PCP), if your PCP is located in our provider database this encounter information will be shared with them immediately following your visit.   A [facility] MyChart account gives you access to today's visit and all your visits, tests, and labs performed at [facility] " click here if you don't have a [facility] MyChart account or go to [email or web address]  Consent: (Patient) Gerald Palmer provided verbal consent for this virtual visit at the beginning of the encounter.  Current Medications:  Current Outpatient Medications:    doxycycline (VIBRA-TABS) 100 MG tablet, Take 1 tablet (100 mg total) by mouth 2 (two) times daily., Disp: 20 tablet, Rfl: 0   predniSONE (DELTASONE) 20 MG tablet, Take 2 tablets (40 mg total) by mouth daily with breakfast., Disp: 10 tablet, Rfl: 0   albuterol (PROVENTIL) (2.5 MG/3ML) 0.083% nebulizer solution, Take 3 mLs (2.5 mg total) by nebulization every 6 (six) hours as needed for wheezing or shortness of breath., Disp: 75 mL, Rfl: 1   albuterol (VENTOLIN HFA) 108 (90 Base) MCG/ACT inhaler, INHALE 2 PUFFS INTO THE LUNGS EVERY 4 TO 6 HOURS AS NEEDED FOR COUGH OR WHEEZING, Disp: 42.5 g, Rfl: 5   allopurinol (ZYLOPRIM) 100 MG tablet, Take 2 tablets (200 mg total) by mouth daily. Total dose 800 milligrams per day, Disp: 90 tablet, Rfl: 2   allopurinol (ZYLOPRIM) 300 MG tablet, Take 2 tablets (600 mg total) by mouth daily. Total dose 800 mg/day, Disp: 180 tablet, Rfl: 3   atorvastatin (LIPITOR) 40 MG tablet, TAKE 1 TABLET(40 MG) BY MOUTH DAILY, Disp: 90 tablet, Rfl: 3   budesonide-formoterol (SYMBICORT) 160-4.5 MCG/ACT inhaler, Inhale 2 puffs into the lungs 2 (two) times daily., Disp: 30.6 g, Rfl: 1   cetirizine (ZYRTEC) 10 MG tablet, Take 1 tablet (10 mg total) by mouth daily., Disp: 30 tablet, Rfl: 11    colchicine 0.6 MG tablet, TAKE 1 TABLET(0.6 MG) BY MOUTH DAILY AS NEEDED FOR GOUT, Disp: 30 tablet, Rfl: 5   diclofenac Sodium (VOLTAREN) 1 % GEL, Apply 2 g topically 4 (four) times daily., Disp: 50 g, Rfl: 0   montelukast (SINGULAIR) 10 MG tablet, Take one tablet once daily, Disp: 90 tablet, Rfl: 3   Medications ordered in this encounter:  Meds ordered this encounter  Medications   doxycycline (VIBRA-TABS) 100 MG tablet    Sig: Take 1 tablet (100 mg total) by mouth 2 (two) times daily.    Dispense:  20 tablet    Refill:  0    Order Specific Question:   Supervising Provider    Answer:   Merrilee Jansky [[id number]]   predniSONE (DELTASONE) 20 MG tablet    Sig: Take 2 tablets (40 mg total) by mouth daily with breakfast.    Dispense:  10 tablet    Refill:  0    Order Specific Question:   Supervising Provider    Answer:   Merrilee Jansky [[id number]]     *If you need refills on other medications prior to your next appointment, please contact your pharmacy*  Follow-Up: Call back or seek an in-person evaluation if the symptoms worsen or if the condition fails to improve as anticipated.  [facility] [phone]  Other Instructions Continue your regular asthma medications, restarting your Singulair.   Please take antibiotic as directed.  Increase fluid intake.  Use Saline nasal  spray.  Take a daily multivitamin. Start the prednisone as directed to help calm down this asthma exacerbation.  Place a humidifier in the bedroom.  Please call or return clinic if symptoms are not improving.  Sinusitis Sinusitis is redness, soreness, and swelling (inflammation) of the paranasal sinuses. Paranasal sinuses are air pockets within the bones of your face (beneath the eyes, the middle of the forehead, or above the eyes). In healthy paranasal sinuses, mucus is able to drain out, and air is able to circulate through them by way of your nose. However, when your paranasal sinuses are inflamed,  mucus and air can become trapped. This can allow bacteria and other germs to grow and cause infection. Sinusitis can develop quickly and last only a short time (acute) or continue over a long period (chronic). Sinusitis that lasts for more than 12 weeks is considered chronic.  CAUSES  Causes of sinusitis include: Allergies. Structural abnormalities, such as displacement of the cartilage that separates your nostrils (deviated septum), which can decrease the air flow through your nose and sinuses and affect sinus drainage. Functional abnormalities, such as when the small hairs (cilia) that line your sinuses and help remove mucus do not work properly or are not present. SYMPTOMS  Symptoms of acute and chronic sinusitis are the same. The primary symptoms are pain and pressure around the affected sinuses. Other symptoms include: Upper toothache. Earache. Headache. Bad breath. Decreased sense of smell and taste. A cough, which worsens when you are lying flat. Fatigue. Fever. Thick drainage from your nose, which often is green and may contain pus (purulent). Swelling and warmth over the affected sinuses. DIAGNOSIS  Your caregiver will perform a physical exam. During the exam, your caregiver may: Look in your nose for signs of abnormal growths in your nostrils (nasal polyps). Tap over the affected sinus to check for signs of infection. View the inside of your sinuses (endoscopy) with a special imaging device with a light attached (endoscope), which is inserted into your sinuses. If your caregiver suspects that you have chronic sinusitis, one or more of the following tests may be recommended: Allergy tests. Nasal culture A sample of mucus is taken from your nose and sent to a lab and screened for bacteria. Nasal cytology A sample of mucus is taken from your nose and examined by your caregiver to determine if your sinusitis is related to an allergy. TREATMENT  Most cases of acute sinusitis are  related to a viral infection and will resolve on their own within 10 days. Sometimes medicines are prescribed to help relieve symptoms (pain medicine, decongestants, nasal steroid sprays, or saline sprays).  However, for sinusitis related to a bacterial infection, your caregiver will prescribe antibiotic medicines. These are medicines that will help kill the bacteria causing the infection.  Rarely, sinusitis is caused by a fungal infection. In theses cases, your caregiver will prescribe antifungal medicine. For some cases of chronic sinusitis, surgery is needed. Generally, these are cases in which sinusitis recurs more than 3 times per year, despite other treatments. HOME CARE INSTRUCTIONS  Drink plenty of water. Water helps thin the mucus so your sinuses can drain more easily. Use a humidifier. Inhale steam 3 to 4 times a day (for example, sit in the bathroom with the shower running). Apply a warm, moist washcloth to your face 3 to 4 times a day, or as directed by your caregiver. Use saline nasal sprays to help moisten and clean your sinuses. Take over-the-counter or prescription medicines for  pain, discomfort, or fever only as directed by your caregiver. SEEK IMMEDIATE MEDICAL CARE IF: You have increasing pain or severe headaches. You have nausea, vomiting, or drowsiness. You have swelling around your face. You have vision problems. You have a stiff neck. You have difficulty breathing. MAKE SURE YOU:  Understand these instructions. Will watch your condition. Will get help right away if you are not doing well or get worse. Document Released: 12/06/2005 Document Revised: 02/28/2012 Document Reviewed: 12/21/2011 [employer] Patient Information 2014 [place], [place].    If you have been instructed to have an in-person evaluation today at a local Urgent Care facility, please use the link below. It will take you to a list of all of our available [facility], including address, phone  number and hours of operation. Please do not delay care.  [facility]  If you or a family member do not have a primary care provider, use the link below to schedule a visit and establish care. When you choose a [facility] primary care physician or advanced practice provider, you gain a long-term partner in health. Find a Primary Care Provider  Learn more about [facility]'s in-office and virtual care options: [facility] - Get Care Now

## 2023-11-03 NOTE — Progress Notes (Signed)
Virtual Visit Consent   Gerald Palmer, you are scheduled for a virtual visit with a [facility] provider today. Just as with appointments in the office, your consent must be obtained to participate. Your consent will be active for this visit and any virtual visit you may have with one of our providers in the next 365 days. If you have a MyChart account, a copy of this consent can be sent to you electronically.  As this is a virtual visit, video technology does not allow for your provider to perform a traditional examination. This may limit your provider's ability to fully assess your condition. If your provider identifies any concerns that need to be evaluated in person or the need to arrange testing (such as labs, EKG, etc.), we will make arrangements to do so. Although advances in technology are sophisticated, we cannot ensure that it will always work on either your end or our end. If the connection with a video visit is poor, the visit may have to be switched to a telephone visit. With either a video or telephone visit, we are not always able to ensure that we have a secure connection.  By engaging in this virtual visit, you consent to the provision of healthcare and authorize for your insurance to be billed (if applicable) for the services provided during this visit. Depending on your insurance coverage, you may receive a charge related to this service.  I need to obtain your verbal consent now. Are you willing to proceed with your visit today? Gerald Palmer has provided verbal consent on 11/03/2023 for a virtual visit (video or telephone). Piedad Climes, [place]  Date: 11/03/2023 2:21 PM  Virtual Visit via Video Note   I, Piedad Climes, connected with  Gerald Palmer  ([id number], Dec 31, 1975) on 11/03/23 at  2:15 PM EST by a video-enabled telemedicine application and verified that I am speaking with the correct person using two identifiers.  Location: Patient: Virtual Visit Location  Patient: Mobile Provider: Virtual Visit Location Provider: Home Office   I discussed the limitations of evaluation and management by telemedicine and the availability of in person appointments. The patient expressed understanding and agreed to proceed.    History of Present Illness: Gerald Palmer is a 47 y.o. who identifies as a male who was assigned male at birth, and is being seen today for exacerbation of his asthma symptoms since change of season. As of Monday of this week, he has noted increase in chest tightness and wheeze, also with a dry cough. This has been despite regular use of his Symbicort and having to add on albuterol nebulizer daily. Marland Kitchen Has been off of Singulair the past two weeks. Also noting nasal and sinus congestion over past 2 weeks, now with sinus pain and tooth pain. Nasal discharge has now become a dark green. Denies fever, chills.   HPI: HPI  Problems:  Patient Active Problem List   Diagnosis Date Noted   Essential hypertension 08/04/2021   Ulnar neuropathy of both upper extremities 08/04/2021   Meralgia paresthetica 10/05/2019   Carpal tunnel syndrome, bilateral 08/14/2019   Severe obstructive sleep apnea-hypopnea syndrome 05/15/2019   Dyslipidemia 08/08/2018   Hyperglycemia 08/08/2018   Gout 03/21/2018   Knee pain 12/30/2017   Allergic rhinitis 11/14/2017   Asthma 10/11/2017    Allergies:  Allergies  Allergen Reactions   Aspirin     unknown   Ibuprofen Hives    High doses cause hives   Nsaids Hives   Shellfish Allergy  Itching    Eyes swell   Medications:  Current Outpatient Medications:    doxycycline (VIBRA-TABS) 100 MG tablet, Take 1 tablet (100 mg total) by mouth 2 (two) times daily., Disp: 20 tablet, Rfl: 0   predniSONE (DELTASONE) 20 MG tablet, Take 2 tablets (40 mg total) by mouth daily with breakfast., Disp: 10 tablet, Rfl: 0   albuterol (PROVENTIL) (2.5 MG/3ML) 0.083% nebulizer solution, Take 3 mLs (2.5 mg total) by nebulization every 6 (six)  hours as needed for wheezing or shortness of breath., Disp: 75 mL, Rfl: 1   albuterol (VENTOLIN HFA) 108 (90 Base) MCG/ACT inhaler, INHALE 2 PUFFS INTO THE LUNGS EVERY 4 TO 6 HOURS AS NEEDED FOR COUGH OR WHEEZING, Disp: 42.5 g, Rfl: 5   allopurinol (ZYLOPRIM) 100 MG tablet, Take 2 tablets (200 mg total) by mouth daily. Total dose 800 milligrams per day, Disp: 90 tablet, Rfl: 2   allopurinol (ZYLOPRIM) 300 MG tablet, Take 2 tablets (600 mg total) by mouth daily. Total dose 800 mg/day, Disp: 180 tablet, Rfl: 3   atorvastatin (LIPITOR) 40 MG tablet, TAKE 1 TABLET(40 MG) BY MOUTH DAILY, Disp: 90 tablet, Rfl: 3   budesonide-formoterol (SYMBICORT) 160-4.5 MCG/ACT inhaler, Inhale 2 puffs into the lungs 2 (two) times daily., Disp: 30.6 g, Rfl: 1   cetirizine (ZYRTEC) 10 MG tablet, Take 1 tablet (10 mg total) by mouth daily., Disp: 30 tablet, Rfl: 11   colchicine 0.6 MG tablet, TAKE 1 TABLET(0.6 MG) BY MOUTH DAILY AS NEEDED FOR GOUT, Disp: 30 tablet, Rfl: 5   diclofenac Sodium (VOLTAREN) 1 % GEL, Apply 2 g topically 4 (four) times daily., Disp: 50 g, Rfl: 0   montelukast (SINGULAIR) 10 MG tablet, Take one tablet once daily, Disp: 90 tablet, Rfl: 3  Observations/Objective: Patient is well-developed, well-nourished in no acute distress.  Resting comfortably at home.  Head is normocephalic, atraumatic.  No labored breathing. Speech is clear and coherent with logical content.  Patient is alert and oriented at baseline.   Assessment and Plan: 1. Moderate persistent asthma with exacerbation - predniSONE (DELTASONE) 20 MG tablet; Take 2 tablets (40 mg total) by mouth daily with breakfast.  Dispense: 10 tablet; Refill: 0  2. Acute bacterial sinusitis - doxycycline (VIBRA-TABS) 100 MG tablet; Take 1 tablet (100 mg total) by mouth 2 (two) times daily.  Dispense: 20 tablet; Refill: 0  Rx Doxycycline.  Increase fluids.  Rest.  Saline nasal spray.  Probiotic.  Mucinex as directed.  Humidifier in bedroom. Restart  Singulair. Continue Symbicort and Albuterol as directed. Prednisone burst for 5 days as directed..  Call or return to clinic if symptoms are not improving.   Follow Up Instructions: I discussed the assessment and treatment plan with the patient. The patient was provided an opportunity to ask questions and all were answered. The patient agreed with the plan and demonstrated an understanding of the instructions.  A copy of instructions were sent to the patient via MyChart unless otherwise noted below.   The patient was advised to call back or seek an in-person evaluation if the symptoms worsen or if the condition fails to improve as anticipated.    Piedad Climes, PA-C

## 2023-11-07 DIAGNOSIS — G4733 Obstructive sleep apnea (adult) (pediatric): Secondary | ICD-10-CM | POA: Diagnosis not present

## 2024-01-04 ENCOUNTER — Other Ambulatory Visit: Payer: Self-pay | Admitting: Family Medicine

## 2024-01-18 DIAGNOSIS — G4733 Obstructive sleep apnea (adult) (pediatric): Secondary | ICD-10-CM | POA: Diagnosis not present

## 2024-01-31 ENCOUNTER — Telehealth: Payer: Self-pay | Admitting: Family Medicine

## 2024-01-31 NOTE — Telephone Encounter (Addendum)
Patient dropped off document FMLA, to be filled out by provider. Patient requested to send it back via Call Patient to pick up within 5-days. Document is located in providers tray at front office.Please advise at Mobile [phone] (mobile). I advised pt to make an OV with pcp since last seen in 07/2023 for CPE . Pt is scheduled for 02/07/24 @ 1pm. Can we confirm if this OV is needed to complete ppw?

## 2024-02-01 NOTE — Telephone Encounter (Signed)
FMLA form placed in PCP office to be reviewed

## 2024-02-07 ENCOUNTER — Emergency Department (HOSPITAL_BASED_OUTPATIENT_CLINIC_OR_DEPARTMENT_OTHER)
Admission: EM | Admit: 2024-02-07 | Discharge: 2024-02-07 | Disposition: A | Discharge status: Home / Self Care | Payer: Federal, State, Local not specified - PPO | Attending: Emergency Medicine | Admitting: Emergency Medicine

## 2024-02-07 ENCOUNTER — Other Ambulatory Visit: Payer: Self-pay

## 2024-02-07 ENCOUNTER — Encounter (HOSPITAL_BASED_OUTPATIENT_CLINIC_OR_DEPARTMENT_OTHER): Payer: Self-pay

## 2024-02-07 ENCOUNTER — Telehealth: Payer: Federal, State, Local not specified - PPO

## 2024-02-07 ENCOUNTER — Emergency Department (HOSPITAL_BASED_OUTPATIENT_CLINIC_OR_DEPARTMENT_OTHER): Payer: Federal, State, Local not specified - PPO

## 2024-02-07 ENCOUNTER — Ambulatory Visit: Payer: Federal, State, Local not specified - PPO | Admitting: Family Medicine

## 2024-02-07 DIAGNOSIS — I10 Essential (primary) hypertension: Secondary | ICD-10-CM | POA: Diagnosis not present

## 2024-02-07 DIAGNOSIS — J101 Influenza due to other identified influenza virus with other respiratory manifestations: Secondary | ICD-10-CM | POA: Insufficient documentation

## 2024-02-07 DIAGNOSIS — R059 Cough, unspecified: Secondary | ICD-10-CM | POA: Diagnosis not present

## 2024-02-07 DIAGNOSIS — J4541 Moderate persistent asthma with (acute) exacerbation: Secondary | ICD-10-CM

## 2024-02-07 DIAGNOSIS — J45909 Unspecified asthma, uncomplicated: Secondary | ICD-10-CM | POA: Diagnosis not present

## 2024-02-07 DIAGNOSIS — Z79899 Other long term (current) drug therapy: Secondary | ICD-10-CM | POA: Diagnosis not present

## 2024-02-07 LAB — RESP PANEL BY RT-PCR (RSV, FLU A&B, COVID)  RVPGX2
Influenza A by PCR: POSITIVE — AB
Influenza B by PCR: NEGATIVE
Resp Syncytial Virus by PCR: NEGATIVE
SARS Coronavirus 2 by RT PCR: NEGATIVE

## 2024-02-07 MED ORDER — ALBUTEROL SULFATE (2.5 MG/3ML) 0.083% IN NEBU: 2.5000 mg | INHALATION_SOLUTION | Freq: Four times a day (QID) | RESPIRATORY_TRACT | 1 refills | Status: DC | PRN

## 2024-02-07 MED ORDER — PREDNISONE 20 MG PO TABS: 40.0000 mg | ORAL_TABLET | Freq: Every day | ORAL | 0 refills | Status: DC

## 2024-02-07 MED ORDER — ALBUTEROL SULFATE HFA 108 (90 BASE) MCG/ACT IN AERS: 2.0000 | INHALATION_SPRAY | RESPIRATORY_TRACT | 5 refills | Status: DC | PRN

## 2024-02-07 NOTE — ED Triage Notes (Signed)
 Pt POV d/t cough and congestion for 1 week.  Pt has asthma - denies fever.  States he was at the [facility] on Friday.

## 2024-02-07 NOTE — ED Provider Notes (Signed)
 [place] EMERGENCY DEPARTMENT AT [facility] Provider Note   CSN: [id number] Arrival date & time: 02/07/24  4098     History  Chief Complaint  Patient presents with   Cough    Draiden Mirsky is a 48 y.o. male.  48 year old male with history of asthma and hypertension who presents to the emergency department with cough.  Patient reports that 4 days ago he started experiencing a cough.  Also reports has had some congestion.  This happened after going to casino where he was very smoky.  Says that the cough has been nonproductive.  Has been trying Robitussin and Sudafed without relief of his symptoms.  Also reports taking his inhaler with no significant improvement.       Home Medications Prior to Admission medications   Medication Sig Start Date End Date Taking? Authorizing Provider  albuterol (PROVENTIL) (2.5 MG/3ML) 0.083% nebulizer solution Take 3 mLs (2.5 mg total) by nebulization every 6 (six) hours as needed for wheezing or shortness of breath. 02/07/24   Rondel Baton, MD  albuterol (VENTOLIN HFA) 108 (90 Base) MCG/ACT inhaler Inhale 2 puffs into the lungs every 4 (four) hours as needed for wheezing or shortness of breath. 02/07/24   Rondel Baton, MD  allopurinol (ZYLOPRIM) 100 MG tablet Take 2 tablets (200 mg total) by mouth daily. Total dose 800 milligrams per day 06/15/23   Rodolph Bong, MD  allopurinol (ZYLOPRIM) 300 MG tablet Take 2 tablets (600 mg total) by mouth daily. Total dose 800 mg/day 06/15/23   Rodolph Bong, MD  atorvastatin (LIPITOR) 40 MG tablet TAKE 1 TABLET(40 MG) BY MOUTH DAILY 08/11/23   Ardith Dark, MD  budesonide-formoterol [facility]) 160-4.5 MCG/ACT inhaler Inhale 2 puffs into the lungs 2 (two) times daily. 08/11/23   Ardith Dark, MD  cetirizine (ZYRTEC) 10 MG tablet Take 1 tablet (10 mg total) by mouth daily. 03/15/19   Ardith Dark, MD  colchicine 0.6 MG tablet TAKE 1 TABLET(0.6 MG) BY MOUTH DAILY AS NEEDED FOR GOUT 04/07/23    Ardith Dark, MD  diclofenac Sodium (VOLTAREN) 1 % GEL Apply 2 g topically 4 (four) times daily. 10/27/21   Tegeler, Canary Brim, MD  doxycycline (VIBRA-TABS) 100 MG tablet Take 1 tablet (100 mg total) by mouth 2 (two) times daily. 11/03/23   Waldon Merl, PA-C  montelukast (SINGULAIR) 10 MG tablet Take one tablet once daily 08/31/21   Ardith Dark, MD  predniSONE (DELTASONE) 20 MG tablet Take 2 tablets (40 mg total) by mouth daily with breakfast. 02/07/24   Rondel Baton, MD      Allergies    Aspirin, Ibuprofen, Nsaids, and Shellfish allergy    Review of Systems   Review of Systems  Physical Exam Updated Vital Signs BP (!) 156/105   Pulse 88   Temp 99.2 F (37.3 C) (Oral)   Resp 20   Ht 5\' 7"  (1.702 m)   Wt 113.4 kg   SpO2 99%   BMI 39.16 kg/m  Physical Exam Vitals and nursing note reviewed.  Constitutional:      General: He is not in acute distress.    Appearance: He is well-developed.  HENT:     Head: Normocephalic and atraumatic.     Right Ear: External ear normal.     Left Ear: External ear normal.     Nose: Congestion present.  Eyes:     Extraocular Movements: Extraocular movements intact.     Conjunctiva/sclera: Conjunctivae  normal.     Pupils: Pupils are equal, round, and reactive to light.  Cardiovascular:     Rate and Rhythm: Normal rate and regular rhythm.     Heart sounds: Normal heart sounds.  Pulmonary:     Effort: Pulmonary effort is normal. No respiratory distress.     Breath sounds: Normal breath sounds.  Abdominal:     Palpations: Abdomen is soft.  Musculoskeletal:     Cervical back: Normal range of motion and neck supple.     Right lower leg: No edema.     Left lower leg: No edema.  Skin:    General: Skin is warm and dry.  Neurological:     Mental Status: He is alert. Mental status is at baseline.  Psychiatric:        Mood and Affect: Mood normal.        Behavior: Behavior normal.     ED Results / Procedures / Treatments    Labs (all labs ordered are listed, but only abnormal results are displayed) Labs Reviewed  RESP PANEL BY RT-PCR (RSV, FLU A&B, COVID)  RVPGX2 - Abnormal; Notable for the following components:      Result Value   Influenza A by PCR POSITIVE (*)    All other components within normal limits    EKG None  Radiology DG Chest Portable 1 View Result Date: 02/07/2024 CLINICAL DATA:  Coughing congestion. EXAM: PORTABLE CHEST 1 VIEW COMPARISON:  08/17/2021 FINDINGS: The lungs are clear without focal pneumonia, edema, pneumothorax or pleural effusion. Cardiopericardial silhouette is at upper limits of normal for size. No acute bony abnormality. IMPRESSION: No active disease. Electronically Signed   By: Kennith Center M.D.   On: 02/07/2024 07:12    Procedures Procedures    Medications Ordered in ED Medications - No data to display  ED Course/ Medical Decision Making/ A&P                                 Medical Decision Making Amount and/or Complexity of Data Reviewed Radiology: ordered.  Risk Prescription drug management.   Vontae Court is a 48 y.o. male with comorbidities that complicate the patient evaluation including asthma who presents with URI symptoms  Initial Ddx:  URI, sinusitis, pneumonia, asthma exacerbation  MDM:  Feel the patient likely has a URI based on their symptoms.  Will obtain chest x-ray to evaluate for pneumonia.  Based on timeline and severity of symptoms feel that treatment for sinusitis is not warranted at this time.  No wheezing to suggest asthma exacerbation.  Plan:  COVID/flu Chest x-ray  ED Summary/Re-evaluation:  Patient reevaluated in the emergency department and was stable.  Found to have influenza.  Chest x-ray without pneumonia.  Outside the window for Tamiflu benefit.  With his history of asthma we will go ahead and give him a very short course of prednisone to prevent an asthma exacerbation as result of this URI.  Counseled also use his  inhaler for any wheezing.  Feel that they are suitable for outpatient workup at this time so we will have them follow-up with their primary doctor in 2 to 3 days.  Also given a work note.  This patient presents to the ED for concern of complaints listed in HPI, this involves an extensive number of treatment options, and is a complaint that carries with it a high risk of complications and morbidity. Disposition including potential need for  admission considered.   Dispo: DC Home. Return precautions discussed including, but not limited to, those listed in the AVS. Allowed pt time to ask questions which were answered fully prior to dc.  Records reviewed Outpatient Clinic Notes I independently reviewed the following imaging with scope of interpretation limited to determining acute life threatening conditions related to emergency care: Chest x-ray and agree with the radiologist interpretation with the following exceptions: none I personally reviewed and interpreted cardiac monitoring: normal sinus rhythm  I personally reviewed and interpreted the pt's EKG: see above for interpretation  I have reviewed the patients home medications and made adjustments as needed  Portions of this note were generated with Dragon dictation software. Dictation errors may occur despite best attempts at proofreading.     Final Clinical Impression(s) / ED Diagnoses Final diagnoses:  Influenza A    Rx / DC Orders ED Discharge Orders          Ordered    albuterol (PROVENTIL) (2.5 MG/3ML) 0.083% nebulizer solution  Every 6 hours PRN        02/07/24 0745    albuterol (VENTOLIN HFA) 108 (90 Base) MCG/ACT inhaler  Every 4 hours PRN        02/07/24 0745    predniSONE (DELTASONE) 20 MG tablet  Daily with breakfast        02/07/24 0745              Rondel Baton, MD 02/07/24 [phone]

## 2024-02-07 NOTE — Telephone Encounter (Signed)
 FMLA placed at front office to be pickup Faxed to [phone] Copy placed to be scan in patient chart  Patient notified

## 2024-02-07 NOTE — Discharge Instructions (Signed)
 You were seen for your upper respiratory tract infection in the emergency department.   At home, please use Tylenol and ibuprofen for your muscle aches and fevers.  Please use over-the-counter cough medication or tea with honey for your cough.  Take the prednisone to prevent an asthma exacerbation and use your inhaler as needed for any wheezing or shortness of breath.  Follow-up with your primary doctor in 2-3 days regarding your visit.  This may be over the phone.  Return immediately to the emergency department if you experience any of the following: Difficulty breathing, or any other concerning symptoms.    Thank you for visiting our Emergency Department. It was a pleasure taking care of you today.

## 2024-02-17 DIAGNOSIS — G4733 Obstructive sleep apnea (adult) (pediatric): Secondary | ICD-10-CM | POA: Diagnosis not present

## 2024-02-27 ENCOUNTER — Encounter: Payer: Self-pay | Admitting: Family Medicine

## 2024-02-27 ENCOUNTER — Telehealth (INDEPENDENT_AMBULATORY_CARE_PROVIDER_SITE_OTHER): Admitting: Family Medicine

## 2024-02-27 DIAGNOSIS — J4540 Moderate persistent asthma, uncomplicated: Secondary | ICD-10-CM

## 2024-02-27 DIAGNOSIS — J309 Allergic rhinitis, unspecified: Secondary | ICD-10-CM | POA: Diagnosis not present

## 2024-02-27 MED ORDER — PREDNISONE 20 MG PO TABS
40.0000 mg | ORAL_TABLET | Freq: Every day | ORAL | 0 refills | Status: DC
Start: 2024-02-27 — End: 2024-03-08

## 2024-02-27 NOTE — Progress Notes (Signed)
 Virtual Visit via Video Note  I connected with Gerald Palmer on 02/27/24 at 4:27 PM by a video enabled telemedicine application and verified that I am speaking with the correct person using two identifiers.  Patient location: home - by self My location: office - [place].    I discussed the limitations, risks, security and privacy concerns of performing an evaluation and management service by telephone and the availability of in person appointments. I also discussed with the patient that there may be a patient responsible charge related to this service. The patient expressed understanding and agreed to proceed, consent obtained  Chief complaint:  Chief Complaint  Patient presents with   Sinus Problem    Headache, pressure behind eyes, blood in mucous, sinus congestion, pt notes has been going on about 4 days     History of Present Illness: Gerald Palmer is a 48 y.o. male  Sinus congestion: 4 days of HA, pressure behind eyes, more nasal congestion, with some blood tinged mucus. Initial green mucus, clear today. No fever, min sneezing, eye watering, itching.  No sick contacts.  No body aches. HA behind eyes.  Hx of asthma - increased sx's recently.  using singulair and symbicort - consistently past 6 days, albuterol - up to 6 times per day past 4-5 days, some wheezing and dyspnea at times.   Tx: sudafed. No allergy meds currently. Not taking zyrtec.    Lab Results  Component Value Date   HGBA1C 6.3 08/11/2023       Patient Active Problem List   Diagnosis Date Noted   Essential hypertension 08/04/2021   Ulnar neuropathy of both upper extremities 08/04/2021   Meralgia paresthetica 10/05/2019   Carpal tunnel syndrome, bilateral 08/14/2019   Severe obstructive sleep apnea-hypopnea syndrome 05/15/2019   Dyslipidemia 08/08/2018   Hyperglycemia 08/08/2018   Gout 03/21/2018   Knee pain 12/30/2017   Allergic rhinitis 11/14/2017   Asthma 10/11/2017   Past Medical  History:  Diagnosis Date   Asthma    Hypertension    Past Surgical History:  Procedure Laterality Date   ACHILLES TENDON SURGERY     ANKLE FRACTURE SURGERY Right    TONSILLECTOMY     Allergies  Allergen Reactions   Aspirin     unknown   Ibuprofen Hives    High doses cause hives   Nsaids Hives   Shellfish Allergy Itching    Eyes swell   Prior to Admission medications   Medication Sig Start Date End Date Taking? Authorizing Provider  albuterol (PROVENTIL) (2.5 MG/3ML) 0.083% nebulizer solution Take 3 mLs (2.5 mg total) by nebulization every 6 (six) hours as needed for wheezing or shortness of breath. 02/07/24  Yes Rondel Baton, MD  albuterol (VENTOLIN HFA) 108 (90 Base) MCG/ACT inhaler Inhale 2 puffs into the lungs every 4 (four) hours as needed for wheezing or shortness of breath. 02/07/24  Yes Rondel Baton, MD  allopurinol (ZYLOPRIM) 100 MG tablet Take 2 tablets (200 mg total) by mouth daily. Total dose 800 milligrams per day 06/15/23  Yes Rodolph Bong, MD  allopurinol (ZYLOPRIM) 300 MG tablet Take 2 tablets (600 mg total) by mouth daily. Total dose 800 mg/day 06/15/23  Yes Rodolph Bong, MD  atorvastatin (LIPITOR) 40 MG tablet TAKE 1 TABLET(40 MG) BY MOUTH DAILY 08/11/23  Yes Ardith Dark, MD  budesonide-formoterol [facility]) 160-4.5 MCG/ACT inhaler Inhale 2 puffs into the lungs 2 (two) times daily. 08/11/23  Yes Ardith Dark, MD  cetirizine Harless Nakayama)  10 MG tablet Take 1 tablet (10 mg total) by mouth daily. 03/15/19  Yes Ardith Dark, MD  colchicine 0.6 MG tablet TAKE 1 TABLET(0.6 MG) BY MOUTH DAILY AS NEEDED FOR GOUT 04/07/23  Yes Ardith Dark, MD  diclofenac Sodium (VOLTAREN) 1 % GEL Apply 2 g topically 4 (four) times daily. 10/27/21  Yes Tegeler, Canary Brim, MD  montelukast (SINGULAIR) 10 MG tablet Take one tablet once daily 08/31/21  Yes Ardith Dark, MD  predniSONE (DELTASONE) 20 MG tablet Take 2 tablets (40 mg total) by mouth daily with breakfast. 02/07/24   Yes Rondel Baton, MD   Social History   Socioeconomic History   Marital status: Single    Spouse name: Not on file   Number of children: Not on file   Years of education: Not on file   Highest education level: Not on file  Occupational History   Not on file  Tobacco Use   Smoking status: Former   Smokeless tobacco: Never  Vaping Use   Vaping status: Never Used  Substance and Sexual Activity   Alcohol use: Yes    Comment: occasionally   Drug use: No   Sexual activity: Yes  Other Topics Concern   Not on file  Social History Narrative   Not on file   Social Drivers of Health   Financial Resource Strain: Patient Declined (04/07/2023)   Overall Financial Resource Strain (CARDIA)    Difficulty of Paying Living Expenses: Patient declined  Food Insecurity: Patient Declined (04/07/2023)   Hunger Vital Sign    Worried About Running Out of Food in the Last Year: Patient declined    Ran Out of Food in the Last Year: Patient declined  Transportation Needs: Patient Declined (04/07/2023)   PRAPARE - Administrator, Civil Service (Medical): Patient declined    Lack of Transportation (Non-Medical): Patient declined  Physical Activity: Insufficiently Active (04/07/2023)   Exercise Vital Sign    Days of Exercise per Week: 2 days    Minutes of Exercise per Session: 30 min  Stress: Stress Concern Present (04/07/2023)   [employer] of Occupational Health - Occupational Stress Questionnaire    Feeling of Stress : To some extent  Social Connections: Unknown (04/07/2023)   Social Connection and Isolation Panel [NHANES]    Frequency of Communication with Friends and Family: Twice a week    Frequency of Social Gatherings with Friends and Family: Once a week    Attends Religious Services: Patient declined    Database administrator or Organizations: No    Attends Engineer, structural: Not on file    Marital Status: Patient declined  Intimate Partner Violence: Not  on file    Observations/Objective: There were no vitals filed for this visit. Nontoxic appearance on video.  Speaking full sentences without respiratory distress, stridor or audible wheeze.  Appropriate responses, euthymic mood.  All questions answered with understanding of plan expressed  Assessment and Plan: Allergic rhinitis, unspecified seasonality, unspecified trigger - Plan: predniSONE (DELTASONE) 20 MG tablet  Moderate persistent asthma, unspecified whether complicated - Plan: predniSONE (DELTASONE) 20 MG tablet Suspected allergy and asthma flare, possibly with underlying viral infection.  Initial discolored nasal discharge now clear.  Some persistent pressure, likely related to allergies.  Also with increased need for albuterol.  He is back on his Symbicort maintenance inhaler, but with frequent need for albuterol will start short course of prednisone 40 mg daily x 3 days with potential side  effects and risk discussed.  Noted previous elevated A1c, hyperglycemia symptoms and precautions given.  Symptomatic care for allergies with Zyrtec, Flonase nasal spray, saline nasal spray as needed for congestion.  If return of discolored nasal discharge with worsening sinus symptoms would consider antibiotic for secondary bacterial sinus infection but unlikely at this time.  RTC precautions.  Follow Up Instructions: As needed.   I discussed the assessment and treatment plan with the patient. The patient was provided an opportunity to ask questions and all were answered. The patient agreed with the plan and demonstrated an understanding of the instructions.   The patient was advised to call back or seek an in-person evaluation if the symptoms worsen or if the condition fails to improve as anticipated.   Shade Flood, MD

## 2024-02-27 NOTE — Patient Instructions (Signed)
 As we discussed your symptoms are likely due to a flare of allergies and asthma.  Virus infection is also possible, but with the frequent use of albuterol I think it would be reasonable to try a short course of prednisone in addition to your Symbicort.  Okay to continue albuterol if needed for breakthrough wheezing.  I sent the prednisone to your pharmacy.  Restart Zyrtec once per day and I would recommend trying Flonase nasal spray over-the-counter for possible allergy symptoms.  For nasal congestion try saline nasal spray.  If the nasal congestion does not continue to improve, or if you have worsening headache, discolored nasal congestion or other worsening sinus symptoms let me know and I would consider an antibiotic for possible bacterial sinus infection at that time.  Let me know if there are questions and take care.  Dr. Neva Seat

## 2024-03-08 ENCOUNTER — Telehealth: Admitting: Family Medicine

## 2024-03-08 VITALS — Ht 67.0 in | Wt 255.0 lb

## 2024-03-08 DIAGNOSIS — J45901 Unspecified asthma with (acute) exacerbation: Secondary | ICD-10-CM

## 2024-03-08 DIAGNOSIS — G4733 Obstructive sleep apnea (adult) (pediatric): Secondary | ICD-10-CM

## 2024-03-08 MED ORDER — TRELEGY ELLIPTA 100-62.5-25 MCG/ACT IN AEPB: 1.0000 | INHALATION_SPRAY | Freq: Every day | RESPIRATORY_TRACT | 11 refills | Status: DC

## 2024-03-08 MED ORDER — AIRSUPRA 90-80 MCG/ACT IN AERO
2.0000 | INHALATION_SPRAY | RESPIRATORY_TRACT | 1 refills | Status: DC | PRN
Start: 2024-03-08 — End: 2024-10-15

## 2024-03-08 NOTE — Assessment & Plan Note (Signed)
 Worsened recently and has having to use more albuterol than before.  This is due to the pollen outbreak.  We did discuss treatment options.  We will switch his Symbicort to Trelegy and switch his albuterol to Airsupra.  He will continue his Singulair.  He will follow-up with [place] in a few weeks if not improving.  May need referral to pulmonology.

## 2024-03-08 NOTE — Progress Notes (Signed)
   Gerald Palmer is a 48 y.o. male who presents today for a virtual office visit.  Assessment/Plan:  Chronic Problems Addressed Today: Severe obstructive sleep apnea-hypopnea syndrome He has been compliant with CPAP though is interested in inspire device.  Will place referral to ENT.  Asthma Worsened recently and has having to use more albuterol than before.  This is due to the pollen outbreak.  We did discuss treatment options.  We will switch his Symbicort to Trelegy and switch his albuterol to Airsupra.  He will continue his Singulair.  He will follow-up with [place] in a few weeks if not improving.  May need referral to pulmonology.     Subjective:  HPI:  See A/P for status of chronic conditions.  Patient would like to talk about referral for inspire device placement.  He was diagnosed with OSA several years ago.  He has been compliant with CPAP though does not feel like it is adequately treating his sleep apnea symptoms.  He is additionally having some difficulties with the mask and appliance.    He is also having a little bit more issues with his allergies and asthma.  He has been having to take his albuterol more frequently than normal the last few weeks.  He attributes this to the pollen outbreak which does seem to be worsening.       Objective/Observations  Physical Exam: Gen: NAD, resting comfortably Pulm: Normal work of breathing Neuro: Grossly normal, moves all extremities Psych: Normal affect and thought content  Virtual Visit via Video   I connected with Gerald Palmer on 03/08/24 at  1:20 PM EDT by a video enabled telemedicine application and verified that I am speaking with the correct person using two identifiers. The limitations of evaluation and management by telemedicine and the availability of in person appointments were discussed. The patient expressed understanding and agreed to proceed.   Patient location: Home Provider location: [place] [employer] Persons participating in the virtual visit: Myself and Patient     Katina Degree. [name], MD 03/08/2024 1:39 PM

## 2024-03-08 NOTE — Assessment & Plan Note (Signed)
 He has been compliant with CPAP though is interested in inspire device.  Will place referral to ENT.

## 2024-03-17 DIAGNOSIS — G4733 Obstructive sleep apnea (adult) (pediatric): Secondary | ICD-10-CM | POA: Diagnosis not present

## 2024-04-12 DIAGNOSIS — J45909 Unspecified asthma, uncomplicated: Secondary | ICD-10-CM | POA: Diagnosis not present

## 2024-04-12 DIAGNOSIS — E785 Hyperlipidemia, unspecified: Secondary | ICD-10-CM | POA: Diagnosis not present

## 2024-04-12 DIAGNOSIS — G4733 Obstructive sleep apnea (adult) (pediatric): Secondary | ICD-10-CM | POA: Diagnosis not present

## 2024-04-12 DIAGNOSIS — J3089 Other allergic rhinitis: Secondary | ICD-10-CM | POA: Diagnosis not present

## 2024-04-27 ENCOUNTER — Encounter: Payer: Self-pay | Admitting: Family Medicine

## 2024-04-27 ENCOUNTER — Ambulatory Visit: Admitting: Family Medicine

## 2024-04-27 ENCOUNTER — Telehealth: Admitting: Family Medicine

## 2024-04-27 VITALS — BP 136/89 | HR 71 | Temp 97.9°F | Ht 67.0 in | Wt 260.8 lb

## 2024-04-27 DIAGNOSIS — J4540 Moderate persistent asthma, uncomplicated: Secondary | ICD-10-CM

## 2024-04-27 DIAGNOSIS — J3089 Other allergic rhinitis: Secondary | ICD-10-CM

## 2024-04-27 DIAGNOSIS — I10 Essential (primary) hypertension: Secondary | ICD-10-CM | POA: Diagnosis not present

## 2024-04-27 MED ORDER — PREDNISONE 20 MG PO TABS: 20.0000 mg | ORAL_TABLET | Freq: Every day | ORAL | 0 refills | Status: DC

## 2024-04-27 MED ORDER — AMOXICILLIN-POT CLAVULANATE 875-125 MG PO TABS: 1.0000 | ORAL_TABLET | Freq: Two times a day (BID) | ORAL | 0 refills | Status: DC

## 2024-04-27 MED ORDER — ALBUTEROL SULFATE HFA 108 (90 BASE) MCG/ACT IN AERS: 2.0000 | INHALATION_SPRAY | RESPIRATORY_TRACT | 5 refills | Status: DC | PRN

## 2024-04-27 MED ORDER — BUDESONIDE-FORMOTEROL FUMARATE 160-4.5 MCG/ACT IN AERO: 2.0000 | INHALATION_SPRAY | Freq: Two times a day (BID) | RESPIRATORY_TRACT | 3 refills | Status: DC

## 2024-04-27 NOTE — Patient Instructions (Signed)
 It was very nice to see you today!  You have a sinus infection.  Start the Augmentin  and prednisone .  I will refill your inhalers.  Let us  know if not proving by next week.  Return if symptoms worsen or fail to improve.   Take care, Dr [name]  PLEASE NOTE:  If you had any lab tests, please let us  know if you have not heard back within a few days. You may see your results on mychart before we have a chance to review them but we will give you a call once they are reviewed by us .   If we ordered any referrals today, please let us  know if you have not heard from their office within the next week.   If you had any urgent prescriptions sent in today, please check with the pharmacy within an hour of our visit to make sure the prescription was transmitted appropriately.   Please try these tips to maintain a healthy lifestyle:  Eat at least 3 REAL meals and 1-2 snacks per day.  Aim for no more than 5 hours between eating.  If you eat breakfast, please do so within one hour of getting up.   Each meal should contain half fruits/vegetables, one quarter protein, and one quarter carbs (no bigger than a computer mouse)  Cut down on sweet beverages. This includes juice, soda, and sweet tea.   Drink at least 1 glass of water with each meal and aim for at least 8 glasses per day  Exercise at least 150 minutes every week.

## 2024-04-27 NOTE — Assessment & Plan Note (Signed)
 At goal today without meds.  He can monitor at home and let us  know if persistently elevated.

## 2024-04-27 NOTE — Progress Notes (Signed)
   Gerald Palmer is a 48 y.o. male who presents today for an office visit.  Assessment/Plan:  New/Acute Problems: Sinusitis  No red flags.  Exam and history consistent with sinusitis.  Given length of symptoms would be reasonable for us  to start antibiotics at this point.  Will start Augmentin .  Also give him some burst 20 mg daily for 5 days.  Encouraged hydration.  He can use over-the-counter meds as needed as well.  He will let us  know if not improving.  If this continues to be a recurrent issue may consider referral to ENT.  We discussed reasons to return to care.  Follow-up as needed.   Chronic Problems Addressed Today: Asthma Saw him about 6 weeks ago for this and we switched his inhalers to Airsupra  and Trelegy.  He does not feel like this is giving him much benefit and they are much more expensive than his previous inhalers.  He would like to go back to his previous regimen with albuterol  and Symbicort .  We will send this in for him today.  Essential hypertension At goal today without meds.  He can monitor at home and let us  know if persistently elevated.     Subjective:  HPI:  Patient here with sinus congestion. Started about 10 days ago. Symptoms including headache and congestion.  Symptoms are getting worse the last few days. Some blood in mucus as well. Feels like his left nostril is stopped up. No fevers or chills.  Feels like previous sinus infections.       Objective:  Physical Exam: BP 136/89   Pulse 71   Temp 97.9 F (36.6 C) (Temporal)   Ht 5\' 7"  (1.702 m)   Wt 260 lb 12.8 oz (118.3 kg)   SpO2 97%   BMI 40.85 kg/m   Gen: No acute distress, resting comfortably HEENT: TMs clear.  His mucosa erythematous and boggy bilaterally with thick discharge. CV: Regular rate and rhythm with no murmurs appreciated Pulm: Normal work of breathing, clear to auscultation bilaterally with no crackles, wheezes, or rhonchi Neuro: Grossly normal, moves all extremities Psych:  Normal affect and thought content      Gerald Asare M. [name], MD 04/27/2024 2:50 PM

## 2024-04-27 NOTE — Assessment & Plan Note (Signed)
 Saw him about 6 weeks ago for this and we switched his inhalers to Airsupra  and Trelegy.  He does not feel like this is giving him much benefit and they are much more expensive than his previous inhalers.  He would like to go back to his previous regimen with albuterol  and Symbicort .  We will send this in for him today.

## 2024-05-17 ENCOUNTER — Institutional Professional Consult (permissible substitution) (INDEPENDENT_AMBULATORY_CARE_PROVIDER_SITE_OTHER): Admitting: Otolaryngology

## 2024-07-03 ENCOUNTER — Ambulatory Visit: Admitting: Family Medicine

## 2024-07-05 ENCOUNTER — Ambulatory Visit: Admitting: Family Medicine

## 2024-07-05 VITALS — BP 138/82 | HR 78 | Temp 98.4°F | Ht 67.0 in | Wt 258.4 lb

## 2024-07-05 DIAGNOSIS — J329 Chronic sinusitis, unspecified: Secondary | ICD-10-CM | POA: Diagnosis not present

## 2024-07-05 DIAGNOSIS — J4540 Moderate persistent asthma, uncomplicated: Secondary | ICD-10-CM | POA: Diagnosis not present

## 2024-07-05 DIAGNOSIS — I10 Essential (primary) hypertension: Secondary | ICD-10-CM

## 2024-07-05 MED ORDER — ALBUTEROL SULFATE HFA 108 (90 BASE) MCG/ACT IN AERS: 2.0000 | INHALATION_SPRAY | RESPIRATORY_TRACT | 5 refills | Status: AC | PRN

## 2024-07-05 MED ORDER — PREDNISONE 50 MG PO TABS
ORAL_TABLET | ORAL | 0 refills | Status: DC
Start: 2024-07-05 — End: 2024-10-01

## 2024-07-05 MED ORDER — LEVOFLOXACIN 500 MG PO TABS: 500.0000 mg | ORAL_TABLET | Freq: Every day | ORAL | 0 refills | Status: AC

## 2024-07-05 MED ORDER — BUDESONIDE-FORMOTEROL FUMARATE 160-4.5 MCG/ACT IN AERO: 2.0000 | INHALATION_SPRAY | Freq: Two times a day (BID) | RESPIRATORY_TRACT | 3 refills | Status: AC

## 2024-07-05 NOTE — Assessment & Plan Note (Signed)
Blood pressure at goal today without medications.

## 2024-07-05 NOTE — Progress Notes (Signed)
   Gerald Palmer is a 48 y.o. male who presents today for an office visit.  Assessment/Plan:  New/Acute Problems: Recurrent sinusitis Exam today consistent with sinusitis.  He has had multiple flares over the last several months and was most recently treated for this 2 months ago.  We treated with Augmentin  and prednisone  a couple of months ago.  We did discuss further treatment options.  We will start Levaquin  given his recent use of Augmentin .  We did discuss potential side effects of this as well.  Also start prednisone  burst as he has done well with this in the past.  Given recurrent nature of his sinusitis will also place referral to ENT for further evaluation and management.  We discussed reasons to return to care.  Chronic Problems Addressed Today: Asthma Mild flare related to above URI.  Will refill his albuterol  and Symbicort  that should have improvement with above prednisone  burst.  Essential hypertension Blood pressure at goal today without medications.     Subjective:  HPI:  See assessment / plan for status of chronic conditions. He is here today with sinus congestion.  Symptoms started about a week ago.  I last saw him a couple of months ago.  At the time he was doing sinus infection as well.  We gave him Augmentin  and prednisone  his symptoms improved within a week.  He was doing well until symptoms returned about a week ago.  He did have some bloody nasal discharge last week as well.  He is having some cough and congestion.  He is worried about asthma flareup as well.        Objective:  Physical Exam: BP 138/82   Pulse 78   Temp 98.4 F (36.9 C) (Temporal)   Ht 5' 7 (1.702 m)   Wt 258 lb 6.4 oz (117.2 kg)   SpO2 97%   BMI 40.47 kg/m   Gen: No acute distress, resting comfortably HEENT: TMs with clear effusion.  Nasal mucosa erythematous and boggy bilaterally left worse than right.  OP erythematous. CV: Regular rate and rhythm with no murmurs appreciated Pulm:  Normal work of breathing, clear to auscultation bilaterally with no crackles, wheezes, or rhonchi Neuro: Grossly normal, moves all extremities Psych: Normal affect and thought content      Judine Arciniega M. Kennyth, MD 07/05/2024 3:16 PM

## 2024-07-05 NOTE — Assessment & Plan Note (Signed)
 Mild flare related to above URI.  Will refill his albuterol  and Symbicort  that should have improvement with above prednisone  burst.

## 2024-07-05 NOTE — Patient Instructions (Signed)
 It was very nice to see you today!  You have a sinus infection.  Please start the Levaquin  and prednisone .  I will also refill your asthma inhalers and refer you to ENT.  Make sure that you are getting plenty of fluids.  Let us  know if your symptoms do not improve with the above.  Return if symptoms worsen or fail to improve.   Take care, Dr Kennyth  PLEASE NOTE:  If you had any lab tests, please let us  know if you have not heard back within a few days. You may see your results on mychart before we have a chance to review them but we will give you a call once they are reviewed by us .   If we ordered any referrals today, please let us  know if you have not heard from their office within the next week.   If you had any urgent prescriptions sent in today, please check with the pharmacy within an hour of our visit to make sure the prescription was transmitted appropriately.   Please try these tips to maintain a healthy lifestyle:  Eat at least 3 REAL meals and 1-2 snacks per day.  Aim for no more than 5 hours between eating.  If you eat breakfast, please do so within one hour of getting up.   Each meal should contain half fruits/vegetables, one quarter protein, and one quarter carbs (no bigger than a computer mouse)  Cut down on sweet beverages. This includes juice, soda, and sweet tea.   Drink at least 1 glass of water with each meal and aim for at least 8 glasses per day  Exercise at least 150 minutes every week.

## 2024-07-06 ENCOUNTER — Encounter (INDEPENDENT_AMBULATORY_CARE_PROVIDER_SITE_OTHER): Payer: Self-pay

## 2024-07-20 ENCOUNTER — Encounter (INDEPENDENT_AMBULATORY_CARE_PROVIDER_SITE_OTHER): Payer: Self-pay | Admitting: Otolaryngology

## 2024-09-12 DIAGNOSIS — L02811 Cutaneous abscess of head [any part, except face]: Secondary | ICD-10-CM | POA: Diagnosis not present

## 2024-09-12 DIAGNOSIS — L089 Local infection of the skin and subcutaneous tissue, unspecified: Secondary | ICD-10-CM | POA: Diagnosis not present

## 2024-09-12 DIAGNOSIS — B9689 Other specified bacterial agents as the cause of diseases classified elsewhere: Secondary | ICD-10-CM | POA: Diagnosis not present

## 2024-09-25 ENCOUNTER — Other Ambulatory Visit (HOSPITAL_BASED_OUTPATIENT_CLINIC_OR_DEPARTMENT_OTHER): Payer: Self-pay

## 2024-09-25 ENCOUNTER — Emergency Department (HOSPITAL_BASED_OUTPATIENT_CLINIC_OR_DEPARTMENT_OTHER)

## 2024-09-25 ENCOUNTER — Other Ambulatory Visit: Payer: Self-pay

## 2024-09-25 ENCOUNTER — Emergency Department (HOSPITAL_BASED_OUTPATIENT_CLINIC_OR_DEPARTMENT_OTHER)
Admission: EM | Admit: 2024-09-25 | Discharge: 2024-09-25 | Disposition: A | Discharge status: Home / Self Care | Attending: Emergency Medicine | Admitting: Emergency Medicine

## 2024-09-25 DIAGNOSIS — Z7952 Long term (current) use of systemic steroids: Secondary | ICD-10-CM | POA: Insufficient documentation

## 2024-09-25 DIAGNOSIS — Z79899 Other long term (current) drug therapy: Secondary | ICD-10-CM | POA: Insufficient documentation

## 2024-09-25 DIAGNOSIS — J45909 Unspecified asthma, uncomplicated: Secondary | ICD-10-CM | POA: Insufficient documentation

## 2024-09-25 DIAGNOSIS — I10 Essential (primary) hypertension: Secondary | ICD-10-CM | POA: Insufficient documentation

## 2024-09-25 DIAGNOSIS — Z7951 Long term (current) use of inhaled steroids: Secondary | ICD-10-CM | POA: Diagnosis not present

## 2024-09-25 DIAGNOSIS — R519 Headache, unspecified: Secondary | ICD-10-CM

## 2024-09-25 DIAGNOSIS — I159 Secondary hypertension, unspecified: Secondary | ICD-10-CM | POA: Insufficient documentation

## 2024-09-25 DIAGNOSIS — J320 Chronic maxillary sinusitis: Secondary | ICD-10-CM | POA: Diagnosis not present

## 2024-09-25 LAB — CBC WITH DIFFERENTIAL/PLATELET
Abs Immature Granulocytes: 0.03 K/uL (ref 0.00–0.07)
Basophils Absolute: 0 K/uL (ref 0.0–0.1)
Basophils Relative: 1 %
Eosinophils Absolute: 0.1 K/uL (ref 0.0–0.5)
Eosinophils Relative: 1 %
HCT: 39.8 % (ref 39.0–52.0)
Hemoglobin: 13.4 g/dL (ref 13.0–17.0)
Immature Granulocytes: 0 %
Lymphocytes Relative: 42 %
Lymphs Abs: 3.3 K/uL (ref 0.7–4.0)
MCH: 27.6 pg (ref 26.0–34.0)
MCHC: 33.7 g/dL (ref 30.0–36.0)
MCV: 82.1 fL (ref 80.0–100.0)
Monocytes Absolute: 0.6 K/uL (ref 0.1–1.0)
Monocytes Relative: 7 %
Neutro Abs: 3.9 K/uL (ref 1.7–7.7)
Neutrophils Relative %: 49 %
Platelets: 335 K/uL (ref 150–400)
RBC: 4.85 MIL/uL (ref 4.22–5.81)
RDW: 14.6 % (ref 11.5–15.5)
WBC: 7.9 K/uL (ref 4.0–10.5)
nRBC: 0 % (ref 0.0–0.2)

## 2024-09-25 LAB — BASIC METABOLIC PANEL WITH GFR
Anion gap: 13 (ref 5–15)
BUN: 9 mg/dL (ref 6–20)
CO2: 23 mmol/L (ref 22–32)
Calcium: 9 mg/dL (ref 8.9–10.3)
Chloride: 104 mmol/L (ref 98–111)
Creatinine, Ser: 1.13 mg/dL (ref 0.61–1.24)
GFR, Estimated: >60 mL/min (ref >60)
Glucose, Bld: 103 mg/dL — ABNORMAL HIGH (ref 70–99)
Potassium: 3.3 mmol/L — ABNORMAL LOW (ref 3.5–5.1)
Sodium: 139 mmol/L (ref 135–145)

## 2024-09-25 MED ORDER — ACETAMINOPHEN 500 MG PO TABS
1000.0000 mg | ORAL_TABLET | Freq: Once | ORAL | Status: AC
  Administered 2024-09-25: 1000 mg via ORAL
  Filled 2024-09-25: qty 2

## 2024-09-25 MED ORDER — BUTALBITAL-APAP-CAFFEINE 50-325-40 MG PO TABS
1.0000 | ORAL_TABLET | Freq: Four times a day (QID) | ORAL | 0 refills | Status: AC | PRN
Start: 2024-09-25 — End: 2024-10-15
  Filled 2024-09-25: qty 20, 5d supply, fill #0

## 2024-09-25 MED ORDER — AMLODIPINE BESYLATE 5 MG PO TABS
5.0000 mg | ORAL_TABLET | Freq: Every day | ORAL | 0 refills | Status: AC
  Filled 2024-09-25: qty 30, 30d supply, fill #0

## 2024-09-25 NOTE — ED Triage Notes (Signed)
 Patient states checked his BP at home and it was 161/110. States he checked it because he has been having headaches for several weeks.

## 2024-09-25 NOTE — ED Provider Notes (Signed)
 [place] EMERGENCY DEPARTMENT AT [facility] Provider Note   CSN: [id number] Arrival date & time: 09/25/24  9293     Patient presents with: Hypertension   Gerald Palmer is a 48 y.o. male.   Patient here with high blood pressure and headaches here the last several weeks.  Nothing makes worse or better.  Denies any fever chills neck pain falls.  No migraine history.  Does admit to stress poor sleep.  History of asthma.  Used to be on blood pressure medications but not on them anymore.  Denies any weakness numbness tingling vision loss speech changes.  Mild headache currently.  Mostly has been waking up with headaches but improves with over-the-counter medications.  Denies any chest pain cough sputum production shortness of breath abdominal pain.  The history is provided by the patient.       Prior to Admission medications   Medication Sig Start Date End Date Taking? Authorizing Provider  amLODipine (NORVASC) 5 MG tablet Take 1 tablet (5 mg total) by mouth daily. 09/25/24 10/25/24 Yes Raghav Verrilli, DO  butalbital-acetaminophen -caffeine (FIORICET) 50-325-40 MG tablet Take 1 tablet by mouth every 6 (six) hours as needed for up to 20 days for headache. 09/25/24 10/15/24 Yes Johana Hopkinson, DO  albuterol  (VENTOLIN  HFA) 108 (90 Base) MCG/ACT inhaler Inhale 2 puffs into the lungs every 4 (four) hours as needed for wheezing or shortness of breath. 07/05/24   Kennyth Worth HERO, MD  Albuterol -Budesonide  (AIRSUPRA ) 90-80 MCG/ACT AERO Inhale 2 puffs into the lungs every 4 (four) hours as needed. 03/08/24   Kennyth Worth HERO, MD  allopurinol  (ZYLOPRIM ) 100 MG tablet Take 2 tablets (200 mg total) by mouth daily. Total dose 800 milligrams per day 06/15/23   Corey, Evan S, MD  allopurinol  (ZYLOPRIM ) 300 MG tablet Take 2 tablets (600 mg total) by mouth daily. Total dose 800 mg/day 06/15/23   Joane Artist RAMAN, MD  amoxicillin -clavulanate (AUGMENTIN ) 875-125 MG tablet Take 1 tablet by mouth 2 (two) times  daily. 04/27/24   Kennyth Worth HERO, MD  atorvastatin  (LIPITOR) 40 MG tablet TAKE 1 TABLET(40 MG) BY MOUTH DAILY 08/11/23   Kennyth Worth HERO, MD  budesonide -formoterol  (SYMBICORT ) 160-4.5 MCG/ACT inhaler Inhale 2 puffs into the lungs 2 (two) times daily. 07/05/24   Kennyth Worth HERO, MD  cetirizine  (ZYRTEC ) 10 MG tablet Take 1 tablet (10 mg total) by mouth daily. 03/15/19   Kennyth Worth HERO, MD  colchicine  0.6 MG tablet TAKE 1 TABLET(0.6 MG) BY MOUTH DAILY AS NEEDED FOR GOUT 04/07/23   Kennyth Worth HERO, MD  diclofenac  Sodium (VOLTAREN ) 1 % GEL Apply 2 g topically 4 (four) times daily. 10/27/21   Tegeler, Lonni PARAS, MD  montelukast  (SINGULAIR ) 10 MG tablet Take one tablet once daily 08/31/21   Parker, Caleb M, MD  predniSONE  (DELTASONE ) 50 MG tablet Take 1 tablet daily for 5 days. Then take 1/2 tablet daily for 2 days. 07/05/24   Kennyth Worth HERO, MD    Allergies: Aspirin , Ibuprofen , Nsaids, and Shellfish allergy    Review of Systems  Updated Vital Signs BP (!) 166/99 (BP Location: Right Arm)   Pulse 63   Temp 98.2 F (36.8 C) (Oral)   Resp 15   SpO2 97%   Physical Exam Vitals and nursing note reviewed.  Constitutional:      General: He is not in acute distress.    Appearance: He is well-developed. He is not ill-appearing.  HENT:     Head: Normocephalic and atraumatic.  Nose: Nose normal.     Mouth/Throat:     Mouth: Mucous membranes are moist.  Eyes:     Extraocular Movements: Extraocular movements intact.     Conjunctiva/sclera: Conjunctivae normal.     Pupils: Pupils are equal, round, and reactive to light.  Cardiovascular:     Rate and Rhythm: Normal rate and regular rhythm.     Pulses: Normal pulses.     Heart sounds: Normal heart sounds. No murmur heard. Pulmonary:     Effort: Pulmonary effort is normal. No respiratory distress.     Breath sounds: Normal breath sounds.  Abdominal:     General: Abdomen is flat.     Palpations: Abdomen is soft.     Tenderness: There is no  abdominal tenderness.  Musculoskeletal:        General: No swelling.     Cervical back: Normal range of motion and neck supple.  Skin:    General: Skin is warm and dry.     Capillary Refill: Capillary refill takes less than 2 seconds.  Neurological:     General: No focal deficit present.     Mental Status: He is alert and oriented to person, place, and time.     Cranial Nerves: No cranial nerve deficit.     Sensory: No sensory deficit.     Motor: No weakness.     Coordination: Coordination normal.     Comments: 5+ out of 5 strength throughout, normal sensation, no drift, normal finger-nose-finger, normal speech normal visual field  Psychiatric:        Mood and Affect: Mood normal.     (all labs ordered are listed, but only abnormal results are displayed) Labs Reviewed  BASIC METABOLIC PANEL WITH GFR - Abnormal; Notable for the following components:      Result Value   Potassium 3.3 (*)    Glucose, Bld 103 (*)    All other components within normal limits  CBC WITH DIFFERENTIAL/PLATELET    EKG: EKG Interpretation Date/Time:  Tuesday September 25 2024 07:31:19 EDT Ventricular Rate:  64 PR Interval:  165 QRS Duration:  83 QT Interval:  434 QTC Calculation: 448 R Axis:   40  Text Interpretation: Sinus rhythm Confirmed by Ruthe Cornet [phone]) on 09/25/2024 7:53:57 AM  Radiology: CT Head Wo Contrast Result Date: 09/25/2024 EXAM: CT HEAD WITHOUT CONTRAST 09/25/2024 07:44:54 AM TECHNIQUE: CT of the head was performed without the administration of intravenous contrast. Automated exposure control, iterative reconstruction, and/or weight based adjustment of the mA/kV was utilized to reduce the radiation dose to as low as reasonably achievable. COMPARISON: None available. CLINICAL HISTORY: 48 year old male with headache, increasing frequency or severity, and elevated home blood pressure. FINDINGS: BRAIN AND VENTRICLES: No acute hemorrhage. No evidence of acute infarct. No hydrocephalus.  No extra-axial collection. No mass effect or midline shift. Normal cerebral volume. Faint calcified atherosclerosis of the distal ICAs. No suspicious intracranial vascular hyperdensity. ORBITS: No acute abnormality. SINUSES: Moderate left maxillary sinus mucosal thickening and opacification but minimal sinus mucosal thickening otherwise. SOFT TISSUES AND SKULL: No acute soft tissue abnormality. No skull fracture. Tympanic cavities and mastoids are clear. IMPRESSION: 1. Normal for age noncontrast CT appearance of the brain. 2. Moderate left maxillary sinus inflammation. Electronically signed by: Helayne Hurst MD 09/25/2024 07:50 AM EDT RP Workstation: [id number]     Procedures   Medications Ordered in the ED  acetaminophen  (TYLENOL ) tablet 1,000 mg (has no administration in time range)  Medical Decision Making Amount and/or Complexity of Data Reviewed Labs: ordered. Radiology: ordered.  Risk OTC drugs. Prescription drug management.   Gerald Palmer is here with headache high blood pressure.  Blood pressure 166/99 but otherwise lab works unremarkable.  History of hypertension and asthma but does not take medicine for blood pressure anymore.  Denies any weakness numbness tingling or vision loss.  Mild headache intermittently the last several weeks have gotten better with over-the-counter medications.  Has a mild headache now.  No history of migraines.  Does admit to some stressors.  Overall he is neurologically intact.  Blood pressure is mildly elevated.  Seems like it has been elevated here over the last few weeks.  Differential diagnosis likely migraine type process tension headaches, have no concern for ACS or stroke.  EKG shows sinus rhythm.  No ischemic changes.  Doubt any cardiac or pulmonary process.  Neuro dam is unremarkable but given increased headache frequency recently will get a head CT check basic labs and reevaluate.  Blood work shows no  significant leukocytosis anemia or electrolyte abnormality.  EKG shows sinus rhythm.  No ischemic changes.  Head CT is unremarkable per radiology report.  I reviewed and interpreted labs and imaging.  Overall patient is feeling well.  Will have him start amlodipine if he continues to notice blood pressure greater than 150/90.  Will keep blood pressure journal.  I prescribed Fioricet for any bad migraines for home.  He is educated about its use with Tylenol  and ibuprofen  and to make sure not to exceed Tylenol  maximum.  Patient discharged in good condition.  Understands return precautions.  Suspect tension type headache.  Focus on stress reduction and sleep.  Follow-up with primary care regarding blood pressure.  Return if symptoms worsen.  This chart was dictated using voice recognition software.  Despite best efforts to proofread,  errors can occur which can change the documentation meaning.      Final diagnoses:  Nonintractable headache, unspecified chronicity pattern, unspecified headache type  Secondary hypertension    ED Discharge Orders          Ordered    butalbital-acetaminophen -caffeine (FIORICET) 50-325-40 MG tablet  Every 6 hours PRN        09/25/24 0809    amLODipine (NORVASC) 5 MG tablet  Daily        09/25/24 0809               Ruthe Cornet, DO 09/25/24 [phone]

## 2024-09-25 NOTE — Discharge Instructions (Signed)
 Take Fioricet as needed for bad headaches.  This medication does have 325 mg of Tylenol  in it.  Make sure you do not exceed 4000 mg of Tylenol  in a 24-hour period.  Recommend 650 mg of Tylenol  every 6 hours as needed for headache but she can take up to 1000 mg of Tylenol  every 6 hours if you are not taking any Fioricet.  Otherwise continue ibuprofen  as you have.  Follow-up with your primary care doctor.  If your blood pressure continues to be it was 150/90 I would start amlodipine and follow-up with your primary care doctor.  Maintain blood pressure journal.  If symptoms worsen.

## 2024-09-26 ENCOUNTER — Ambulatory Visit: Admitting: Adult Health

## 2024-09-26 ENCOUNTER — Ambulatory Visit: Payer: Self-pay

## 2024-09-26 NOTE — Progress Notes (Deleted)
 Subjective:    Patient ID: Gerald Palmer, male    DOB: 09-16-1976, 48 y.o.   MRN: [id number]  HPI 48 year old male who  has a past medical history of Asthma and Hypertension.  He is a patient of Dr. Kennyth who I am seeing today for the first time.   He was seen yesterday in the emergency room at [facility] for elevated blood pressure and headaches times several weeks.  He used to be on blood pressure medication but had that taken it in quite some time.  He has mostly been waking up with headaches but these headaches improved with over-the-counter medication.  In the ER he denied any chest pain, shortness of breath, or abdominal pain.  Blood pressure in the ER was 166/99, lab work unremarkable.  His EKG shows sinus rhythm with no ischemic changes.  Head CT unremarkable.  He was started on amlodipine if he continued to notice blood pressure greater than 150/90.  He was also prescribed Fioricet for bad migraines.  He did start his BP medication and today his BP was 167/100 and he did have a headache.    Review of Systems See HPI   Past Medical History:  Diagnosis Date   Asthma    Hypertension     Social History   Socioeconomic History   Marital status: Single    Spouse name: Not on file   Number of children: Not on file   Years of education: Not on file   Highest education level: Some college, no degree  Occupational History   Not on file  Tobacco Use   Smoking status: Former   Smokeless tobacco: Never  Vaping Use   Vaping status: Never Used  Substance and Sexual Activity   Alcohol use: Yes    Comment: occasionally   Drug use: No   Sexual activity: Yes  Other Topics Concern   Not on file  Social History Narrative   Not on file   Social Drivers of Health   Financial Resource Strain: Low Risk  (07/05/2024)   Overall Financial Resource Strain (CARDIA)    Difficulty of Paying Living Expenses: Not very hard  Food Insecurity: No Food Insecurity (07/05/2024)    Hunger Vital Sign    Worried About Running Out of Food in the Last Year: Never true    Ran Out of Food in the Last Year: Never true  Transportation Needs: No Transportation Needs (07/05/2024)   PRAPARE - Administrator, Civil Service (Medical): No    Lack of Transportation (Non-Medical): No  Physical Activity: Insufficiently Active (07/05/2024)   Exercise Vital Sign    Days of Exercise per Week: 4 days    Minutes of Exercise per Session: 30 min  Stress: No Stress Concern Present (07/05/2024)   [employer] of Occupational Health - Occupational Stress Questionnaire    Feeling of Stress: Only a little  Social Connections: Moderately Isolated (07/05/2024)   Social Connection and Isolation Panel    Frequency of Communication with Friends and Family: Once a week    Frequency of Social Gatherings with Friends and Family: Three times a week    Attends Religious Services: Never    Active Member of Clubs or Organizations: No    Attends Engineer, structural: Not on file    Marital Status: Living with partner  Intimate Partner Violence: Not on file    Past Surgical History:  Procedure Laterality Date   ACHILLES TENDON SURGERY  ANKLE FRACTURE SURGERY Right    TONSILLECTOMY      Family History  Problem Relation Age of Onset   Hypertension Mother    Arthritis Mother    Diabetes Mother    Hyperlipidemia Mother    Asthma Father    Hyperlipidemia Father    Hypertension Father    Stroke Father    Hypertension Brother    Hyperlipidemia Brother    Arthritis Maternal Grandmother     Allergies  Allergen Reactions   Aspirin      unknown   Ibuprofen  Hives    High doses cause hives   Nsaids Hives   Shellfish Allergy Itching    Eyes swell    Current Outpatient Medications on File Prior to Visit  Medication Sig Dispense Refill   albuterol  (VENTOLIN  HFA) 108 (90 Base) MCG/ACT inhaler Inhale 2 puffs into the lungs every 4 (four) hours as needed for wheezing or  shortness of breath. 6.7 g 5   Albuterol -Budesonide  (AIRSUPRA ) 90-80 MCG/ACT AERO Inhale 2 puffs into the lungs every 4 (four) hours as needed. 10.7 g 1   allopurinol  (ZYLOPRIM ) 100 MG tablet Take 2 tablets (200 mg total) by mouth daily. Total dose 800 milligrams per day 90 tablet 2   allopurinol  (ZYLOPRIM ) 300 MG tablet Take 2 tablets (600 mg total) by mouth daily. Total dose 800 mg/day 180 tablet 3   amLODipine (NORVASC) 5 MG tablet Take 1 tablet (5 mg total) by mouth daily. 30 tablet 0   amoxicillin -clavulanate (AUGMENTIN ) 875-125 MG tablet Take 1 tablet by mouth 2 (two) times daily. 20 tablet 0   atorvastatin  (LIPITOR) 40 MG tablet TAKE 1 TABLET(40 MG) BY MOUTH DAILY 90 tablet 3   budesonide -formoterol  (SYMBICORT ) 160-4.5 MCG/ACT inhaler Inhale 2 puffs into the lungs 2 (two) times daily. 3 each 3   butalbital-acetaminophen -caffeine (FIORICET) 50-325-40 MG tablet Take 1 tablet by mouth every 6 (six) hours as needed for up to 20 days for headache. 20 tablet 0   cetirizine  (ZYRTEC ) 10 MG tablet Take 1 tablet (10 mg total) by mouth daily. 30 tablet 11   colchicine  0.6 MG tablet TAKE 1 TABLET(0.6 MG) BY MOUTH DAILY AS NEEDED FOR GOUT 30 tablet 5   diclofenac  Sodium (VOLTAREN ) 1 % GEL Apply 2 g topically 4 (four) times daily. 50 g 0   montelukast  (SINGULAIR ) 10 MG tablet Take one tablet once daily 90 tablet 3   predniSONE  (DELTASONE ) 50 MG tablet Take 1 tablet daily for 5 days. Then take 1/2 tablet daily for 2 days. 6 tablet 0   No current facility-administered medications on file prior to visit.    There were no vitals taken for this visit.      Objective:   Physical Exam Vitals and nursing note reviewed.  Constitutional:      Appearance: Normal appearance.  Cardiovascular:     Rate and Rhythm: Normal rate and regular rhythm.     Pulses: Normal pulses.     Heart sounds: Normal heart sounds.  Pulmonary:     Effort: Pulmonary effort is normal.     Breath sounds: Normal breath sounds.   Musculoskeletal:        General: Normal range of motion.  Skin:    General: Skin is warm and dry.     Capillary Refill: Capillary refill takes less than 2 seconds.  Neurological:     General: No focal deficit present.     Mental Status: He is alert and oriented to person, place, and time.  Psychiatric:        Mood and Affect: Mood normal.        Behavior: Behavior normal.        Thought Content: Thought content normal.        Judgment: Judgment normal.           Assessment & Plan:

## 2024-09-26 NOTE — Telephone Encounter (Signed)
 FYI Only or Action Required?: FYI only for provider.  Patient was last seen in primary care on 07/05/2024 by Kennyth Worth HERO, MD.  Called Nurse Triage reporting Hypertension.  Symptoms began unknown/chronic.  Interventions attempted: Prescription medications: amlodipine.  Symptoms are: unchanged.  Triage Disposition: See HCP Within 4 Hours (Or PCP Triage) (overriding See Physician Within 24 Hours)  Patient/caregiver understands and will follow disposition?: Yes  Reason for Disposition  Systolic BP >= 180 OR Diastolic >= 110  Answer Assessment - Initial Assessment Questions Patients wife was transferred to this RN for triage. She is not with the pt and not on the DPR, this RN did not disclose pt PHI. Information was collected and an appt scheduled only.  Wife reports pt went  to ED yesterday for HTN and that his BP there was 162/110 BP.   However, today, his BP is 167/100 and is c/o headache. She denied him c/o 10/10 headache, weakness, numbness, nausea or vomiting. Advised to go back to the ED if those develop.  Already has PCP f/u scheduled, but d/t current acute complaints, pt was scheduled for only same day visit available at regional location.   1. BLOOD PRESSURE: What is your blood pressure? Did you take at least two measurements 5 minutes apart?     167/100  2. ONSET: When did you take your blood pressure?     Today, multiple times  3. HOW: How did you take your blood pressure? (e.g., automatic home BP monitor, visiting nurse)     Machine  4. HISTORY: Do you have a history of high blood pressure?     Yes  5. MEDICINES: Are you taking any medicines for blood pressure? Have you missed any doses recently?     Amlodipine prescribed by ED yesterday  6. OTHER SYMPTOMS: Do you have any symptoms? (e.g., blurred vision, chest pain, difficulty breathing, headache, weakness)     Headache  Protocols used: Blood Pressure - High-A-AH Copied from CRM #[id number].  Topic: Clinical - Red Word Triage >> Sep 26, 2024  3:15 PM Suzen RAMAN wrote: Red Word that prompted transfer to Nurse Triage: elevated BP an Headache requesting an appt. Spouse on the [employer] Scales

## 2024-09-26 NOTE — Telephone Encounter (Signed)
 Appt today

## 2024-09-27 ENCOUNTER — Encounter: Payer: Self-pay | Admitting: Family Medicine

## 2024-09-28 NOTE — Telephone Encounter (Signed)
 Spoke with patient, patient stated did not had blood pressure taking today since he is at work  He is asymptomatic Has an office visit with PCP on 10/01/2024

## 2024-10-01 ENCOUNTER — Encounter: Payer: Self-pay | Admitting: Family Medicine

## 2024-10-01 ENCOUNTER — Ambulatory Visit: Admitting: Family Medicine

## 2024-10-01 VITALS — BP 138/84 | HR 73 | Temp 97.5°F | Ht 67.0 in | Wt 253.2 lb

## 2024-10-01 DIAGNOSIS — J4540 Moderate persistent asthma, uncomplicated: Secondary | ICD-10-CM

## 2024-10-01 DIAGNOSIS — J019 Acute sinusitis, unspecified: Secondary | ICD-10-CM

## 2024-10-01 DIAGNOSIS — I10 Essential (primary) hypertension: Secondary | ICD-10-CM

## 2024-10-01 DIAGNOSIS — F439 Reaction to severe stress, unspecified: Secondary | ICD-10-CM | POA: Diagnosis not present

## 2024-10-01 DIAGNOSIS — R519 Headache, unspecified: Secondary | ICD-10-CM

## 2024-10-01 DIAGNOSIS — B9689 Other specified bacterial agents as the cause of diseases classified elsewhere: Secondary | ICD-10-CM

## 2024-10-01 MED ORDER — METHYLPREDNISOLONE ACETATE 40 MG/ML IJ SUSP
40.0000 mg | Freq: Once | INTRAMUSCULAR | Status: AC
  Administered 2024-10-01: 80 mg via INTRAMUSCULAR

## 2024-10-01 MED ORDER — AMOXICILLIN-POT CLAVULANATE 875-125 MG PO TABS: 1.0000 | ORAL_TABLET | Freq: Two times a day (BID) | ORAL | 0 refills | Status: DC

## 2024-10-01 NOTE — Patient Instructions (Signed)
 It was very nice to see you today!  VISIT SUMMARY: You visited us  today for a follow-up after your recent emergency department visit for high blood pressure and headaches. We discussed your blood pressure readings, sinus issues, asthma symptoms, and stress management.  YOUR PLAN: ESSENTIAL HYPERTENSION: Your blood pressure has been high, but it was better during today's visit. Stress and sinus issues might be affecting your readings. -Continue taking amlodipine as prescribed. -We discussed how to properly measure your blood pressure at home. -Monitor your blood pressure at home and report your readings in two weeks.  RECURRENT SINUSITIS: You have a sinus infection that might be affecting your blood pressure. -You received a steroid injection today. -Take Augmentin  as prescribed. -Monitor your symptoms and report back in two weeks.  ASTHMA: Your asthma symptoms are likely worse due to the weather change. -You received a steroid injection today. -Monitor your symptoms and report back in two weeks.  STRESS MANAGEMENT: Increased stress may be affecting your blood pressure. -We discussed stress management strategies. -If you are interested, we can refer you to counseling.  Return if symptoms worsen or fail to improve.   Take care, Dr Kennyth  PLEASE NOTE:  If you had any lab tests, please let us  know if you have not heard back within a few days. You may see your results on mychart before we have a chance to review them but we will give you a call once they are reviewed by us .   If we ordered any referrals today, please let us  know if you have not heard from their office within the next week.   If you had any urgent prescriptions sent in today, please check with the pharmacy within an hour of our visit to make sure the prescription was transmitted appropriately.   Please try these tips to maintain a healthy lifestyle:  Eat at least 3 REAL meals and 1-2 snacks per day.  Aim for no more  than 5 hours between eating.  If you eat breakfast, please do so within one hour of getting up.   Each meal should contain half fruits/vegetables, one quarter protein, and one quarter carbs (no bigger than a computer mouse)  Cut down on sweet beverages. This includes juice, soda, and sweet tea.   Drink at least 1 glass of water with each meal and aim for at least 8 glasses per day  Exercise at least 150 minutes every week.

## 2024-10-01 NOTE — Assessment & Plan Note (Signed)
 Blood pressure at goal today on amlodipine 5 mg daily.  He has been tolerating well.  He was well-controlled at his most recent office visits here over the last several months without medications.  He has been under some amount of stress recently which may be contributing to recent elevated blood pressure considering also dealing with some sinusitis as above.  Will continue amlodipine 5 mg daily for now. May be able to come off as we treat his above underlying issues though he will monitor at home and follow-up with us  in a few weeks.

## 2024-10-01 NOTE — Progress Notes (Signed)
 Gerald Palmer is a 48 y.o. male who presents today for an office visit.  Assessment/Plan:  New/Acute Problems: Headache  Symptoms are currently manageable.  Workup in the ED was reassuring.  Headache likely multifactorial in setting of stress and below sinusitis.  We are treating this as below.  He will let us  know if this continues to be a persistent issue  Sinusitis Reviewed workup in the ED with patient.  His head CT scan did show a moderate amount of left-sided maxillary sinusitis.  He additionally is having quite a bit of pain and congestion in the left side of his face consistent with prior sinus infections.  Discussed with patient that this may be the source of his headache and some of his elevated blood pressure issues.  He does have a history of recurrent sinusitis.  We will start Augmentin .  He has done well with this.  Also give 80 mg of Depo-Medrol .  He will let us  know if not proving in the next 1 to 2 weeks and would consider referral to ENT at that time.  Chronic Problems Addressed Today: Essential hypertension Blood pressure at goal today on amlodipine 5 mg daily.  He has been tolerating well.  He was well-controlled at his most recent office visits here over the last several months without medications.  He has been under some amount of stress recently which may be contributing to recent elevated blood pressure considering also dealing with some sinusitis as above.  Will continue amlodipine 5 mg daily for now. May be able to come off as we treat his above underlying issues though he will monitor at home and follow-up with us  in a few weeks.  Asthma Patient dealing with mild flare.  We discussed prednisone  burst however he prefers injection.  Will give 80 mg Depo-Medrol  today.  He will continue his albuterol  and Symbicort  and let us  know if not improving.  Stress Patient has been under more stress over the last few weeks.  Patient did not wish to discuss this in depth today  though we did discuss potential treatment options.  He will let us  know if he needs a referral to see a counselor or if he wishes to start medications.     Subjective:  HPI:  See assessment / plan for status of chronic conditions.  Patient is here today for ED follow-up.  He went to the ED 6 days ago with high blood pressure and headaches for several weeks.  In the ED had workup including labs, EKG and head CT.  Initial blood pressure was noted at 166/99 he was started on amlodipine and discharged home with suspecting for tension type headache.  Discussed the use of AI scribe software for clinical note transcription with the patient, who gave verbal consent to proceed.  History of Present Illness Gerald Palmer is a 48 year old male who presents for follow-up after an emergency department visit for high blood pressure and headaches.  He visited the emergency department six days ago due to high blood pressure and headaches that had persisted for several weeks. His initial blood pressure was 166/99 mmHg. He underwent a workup including labs, an EKG, and a head CT. He was started on amlodipine and discharged from the emergency department.  Since the ED visit, his home blood pressure readings have been high, ranging from 150 to 170 mmHg, although a recent reading at the clinic was 138/84 mmHg. He acknowledges potential errors in measuring his blood pressure at home.  He continues to experience occasional headaches and describes a 'fluttering' sensation in his chest. He attributes some of his symptoms to stress, which has been worse over the past two months.  He reports that the weather change is exacerbating his asthma symptoms, causing shortness of breath. He has been experiencing sinus issues, suspecting a possible infection, as he feels congestion and pressure, particularly on one side of his face. No discolored nasal discharge or epistaxis.  His current medications include amlodipine for blood  pressure management. He is concerned about his blood pressure readings and the impact of stress on his health.         Objective:  Physical Exam: BP 138/84   Pulse 73   Temp (!) 97.5 F (36.4 C) (Temporal)   Ht 5' 7 (1.702 m)   Wt 253 lb 3.2 oz (114.9 kg)   SpO2 97%   BMI 39.66 kg/m   Gen: No acute distress, resting comfortably HEENT: TMs with clear effusion bilaterally. CV: Regular rate and rhythm with no murmurs appreciated Pulm: Normal work of breathing, occasional scattered wheeze and rhonchi at bilateral bases. Neuro: Grossly normal, moves all extremities Psych: Normal affect and thought content      Jr Milliron M. Kennyth, MD 10/01/2024 8:04 AM

## 2024-10-01 NOTE — Assessment & Plan Note (Signed)
 Patient dealing with mild flare.  We discussed prednisone  burst however he prefers injection.  Will give 80 mg Depo-Medrol  today.  He will continue his albuterol  and Symbicort  and let us  know if not improving.

## 2024-10-01 NOTE — Assessment & Plan Note (Signed)
 Patient has been under more stress over the last few weeks.  Patient did not wish to discuss this in depth today though we did discuss potential treatment options.  He will let us  know if he needs a referral to see a counselor or if he wishes to start medications.

## 2024-10-01 NOTE — Addendum Note (Signed)
 Addended by: IDA ELORA HERO on: 10/01/2024 08:13 AM   Modules accepted: Orders

## 2024-10-12 ENCOUNTER — Other Ambulatory Visit: Payer: Self-pay | Admitting: Family Medicine

## 2024-11-15 DIAGNOSIS — G4733 Obstructive sleep apnea (adult) (pediatric): Secondary | ICD-10-CM | POA: Diagnosis not present

## 2024-12-15 DIAGNOSIS — G4733 Obstructive sleep apnea (adult) (pediatric): Secondary | ICD-10-CM | POA: Diagnosis not present

## 2025-01-04 ENCOUNTER — Telehealth: Admitting: Physician Assistant

## 2025-01-04 DIAGNOSIS — B9689 Other specified bacterial agents as the cause of diseases classified elsewhere: Secondary | ICD-10-CM | POA: Diagnosis not present

## 2025-01-04 DIAGNOSIS — J019 Acute sinusitis, unspecified: Secondary | ICD-10-CM | POA: Diagnosis not present

## 2025-01-04 MED ORDER — FLUTICASONE PROPIONATE 50 MCG/ACT NA SUSP: 2.0000 | Freq: Every day | NASAL | 0 refills | Status: AC

## 2025-01-04 MED ORDER — AMOXICILLIN-POT CLAVULANATE 875-125 MG PO TABS: 1.0000 | ORAL_TABLET | Freq: Two times a day (BID) | ORAL | 0 refills | Status: AC

## 2025-01-04 NOTE — Progress Notes (Signed)

## 2025-01-06 ENCOUNTER — Other Ambulatory Visit: Payer: Self-pay | Admitting: Family Medicine
